# Patient Record
Sex: Female | Born: 1970 | Race: Black or African American | Hispanic: No | Marital: Single | Smoking: Current every day smoker
Health system: Southern US, Community
[De-identification: ages and names within clinical notes are randomized; demographics above are authoritative.]

## PROBLEM LIST (undated history)

## (undated) DIAGNOSIS — E559 Vitamin D deficiency, unspecified: Secondary | ICD-10-CM

## (undated) DIAGNOSIS — T8859XA Other complications of anesthesia, initial encounter: Secondary | ICD-10-CM

## (undated) DIAGNOSIS — I700 Atherosclerosis of aorta: Secondary | ICD-10-CM

## (undated) DIAGNOSIS — T4145XA Adverse effect of unspecified anesthetic, initial encounter: Secondary | ICD-10-CM

## (undated) DIAGNOSIS — K5790 Diverticulosis of intestine, part unspecified, without perforation or abscess without bleeding: Secondary | ICD-10-CM

## (undated) DIAGNOSIS — F419 Anxiety disorder, unspecified: Secondary | ICD-10-CM

## (undated) DIAGNOSIS — M1990 Unspecified osteoarthritis, unspecified site: Secondary | ICD-10-CM

## (undated) DIAGNOSIS — I10 Essential (primary) hypertension: Secondary | ICD-10-CM

## (undated) DIAGNOSIS — E785 Hyperlipidemia, unspecified: Secondary | ICD-10-CM

## (undated) DIAGNOSIS — R569 Unspecified convulsions: Secondary | ICD-10-CM

## (undated) DIAGNOSIS — K449 Diaphragmatic hernia without obstruction or gangrene: Secondary | ICD-10-CM

## (undated) DIAGNOSIS — K760 Fatty (change of) liver, not elsewhere classified: Secondary | ICD-10-CM

## (undated) DIAGNOSIS — K859 Acute pancreatitis without necrosis or infection, unspecified: Secondary | ICD-10-CM

## (undated) DIAGNOSIS — F319 Bipolar disorder, unspecified: Secondary | ICD-10-CM

## (undated) DIAGNOSIS — J45909 Unspecified asthma, uncomplicated: Secondary | ICD-10-CM

## (undated) DIAGNOSIS — Z8719 Personal history of other diseases of the digestive system: Secondary | ICD-10-CM

## (undated) DIAGNOSIS — M6208 Separation of muscle (nontraumatic), other site: Secondary | ICD-10-CM

## (undated) DIAGNOSIS — L732 Hidradenitis suppurativa: Secondary | ICD-10-CM

## (undated) DIAGNOSIS — K219 Gastro-esophageal reflux disease without esophagitis: Secondary | ICD-10-CM

## (undated) HISTORY — PX: CYST REMOVAL TRUNK: SHX6283

## (undated) HISTORY — PX: HERNIA REPAIR: SHX51

## (undated) HISTORY — PX: APPENDECTOMY: SHX54

## (undated) SURGERY — Surgical Case
Anesthesia: *Unknown

---

## 2006-02-08 ENCOUNTER — Ambulatory Visit: Payer: Self-pay | Admitting: General Surgery

## 2007-05-30 ENCOUNTER — Emergency Department: Payer: Self-pay | Admitting: Emergency Medicine

## 2009-03-18 ENCOUNTER — Emergency Department: Payer: Self-pay | Admitting: Emergency Medicine

## 2009-03-25 ENCOUNTER — Emergency Department: Payer: Self-pay | Admitting: Emergency Medicine

## 2009-04-11 ENCOUNTER — Emergency Department: Payer: Self-pay | Admitting: Emergency Medicine

## 2011-01-30 ENCOUNTER — Emergency Department: Payer: Self-pay | Admitting: *Deleted

## 2011-02-03 ENCOUNTER — Inpatient Hospital Stay (INDEPENDENT_AMBULATORY_CARE_PROVIDER_SITE_OTHER)
Admission: RE | Admit: 2011-02-03 | Discharge: 2011-02-03 | Disposition: A | Discharge status: Home / Self Care | Payer: Self-pay | Source: Ambulatory Visit | Attending: Family Medicine | Admitting: Family Medicine

## 2011-02-03 DIAGNOSIS — T7840XA Allergy, unspecified, initial encounter: Secondary | ICD-10-CM

## 2011-06-10 ENCOUNTER — Emergency Department: Payer: Self-pay | Admitting: Internal Medicine

## 2011-06-10 LAB — COMPREHENSIVE METABOLIC PANEL
Albumin: 3.8 g/dL (ref 3.4–5.0)
Alkaline Phosphatase: 71 U/L (ref 50–136)
Anion Gap: 11 (ref 7–16)
Bilirubin,Total: 0.3 mg/dL (ref 0.2–1.0)
Calcium, Total: 9 mg/dL (ref 8.5–10.1)
Chloride: 103 mmol/L (ref 98–107)
Creatinine: 1.03 mg/dL (ref 0.60–1.30)
EGFR (African American): >60
EGFR (Non-African Amer.): >60
Glucose: 91 mg/dL (ref 65–99)
Osmolality: 277 (ref 275–301)
Potassium: 3.7 mmol/L (ref 3.5–5.1)
SGOT(AST): 22 U/L (ref 15–37)
SGPT (ALT): 19 U/L
Total Protein: 7.7 g/dL (ref 6.4–8.2)

## 2011-06-10 LAB — CBC
HCT: 36.3 % (ref 35.0–47.0)
HGB: 12 g/dL (ref 12.0–16.0)
RBC: 4 10*6/uL (ref 3.80–5.20)
RDW: 12.8 % (ref 11.5–14.5)
WBC: 7.5 10*3/uL (ref 3.6–11.0)

## 2011-06-13 ENCOUNTER — Emergency Department: Payer: Self-pay | Admitting: *Deleted

## 2011-06-30 ENCOUNTER — Emergency Department: Payer: Self-pay | Admitting: Emergency Medicine

## 2011-07-12 ENCOUNTER — Emergency Department: Payer: Self-pay | Admitting: Emergency Medicine

## 2011-07-30 ENCOUNTER — Inpatient Hospital Stay: Payer: Self-pay | Admitting: Internal Medicine

## 2011-07-30 LAB — URINALYSIS, COMPLETE
Bilirubin,UR: NEGATIVE
Ketone: NEGATIVE
Leukocyte Esterase: NEGATIVE
Nitrite: NEGATIVE
Ph: 6 (ref 4.5–8.0)
Squamous Epithelial: 13
WBC UR: 4 /HPF (ref 0–5)

## 2011-07-30 LAB — CBC
HGB: 11.5 g/dL — ABNORMAL LOW (ref 12.0–16.0)
MCH: 30.1 pg (ref 26.0–34.0)
MCHC: 33 g/dL (ref 32.0–36.0)
Platelet: 240 10*3/uL (ref 150–440)

## 2011-07-30 LAB — LIPID PANEL
Cholesterol: 242 mg/dL — ABNORMAL HIGH (ref 0–200)
HDL Cholesterol: 27 mg/dL — ABNORMAL LOW (ref 40–60)
VLDL Cholesterol, Calc: 41 mg/dL — ABNORMAL HIGH (ref 5–40)

## 2011-07-30 LAB — COMPREHENSIVE METABOLIC PANEL
Alkaline Phosphatase: 68 U/L (ref 50–136)
BUN: 12 mg/dL (ref 7–18)
Bilirubin,Total: 0.2 mg/dL (ref 0.2–1.0)
Chloride: 100 mmol/L (ref 98–107)
Co2: 24 mmol/L (ref 21–32)
Creatinine: 0.97 mg/dL (ref 0.60–1.30)
EGFR (Non-African Amer.): >60
Glucose: 94 mg/dL (ref 65–99)
SGOT(AST): 19 U/L (ref 15–37)
SGPT (ALT): 17 U/L
Total Protein: 7.8 g/dL (ref 6.4–8.2)

## 2011-07-30 LAB — PREGNANCY, URINE: Pregnancy Test, Urine: NEGATIVE m[IU]/mL

## 2011-07-30 LAB — IRON AND TIBC
Iron Bind.Cap.(Total): 320 ug/dL (ref 250–450)
Iron Saturation: 13 %
Iron: 42 ug/dL — ABNORMAL LOW (ref 50–170)
Unbound Iron-Bind.Cap.: 278 ug/dL

## 2011-07-30 LAB — LIPASE, BLOOD: Lipase: 666 U/L — ABNORMAL HIGH (ref 73–393)

## 2011-07-30 LAB — OCCULT BLOOD X 1 CARD TO LAB, STOOL: Occult Blood, Feces: NEGATIVE

## 2011-07-30 LAB — FERRITIN: Ferritin (ARMC): 35 ng/mL (ref 8–388)

## 2011-07-31 LAB — BASIC METABOLIC PANEL
BUN: 5 mg/dL — ABNORMAL LOW (ref 7–18)
Calcium, Total: 8.5 mg/dL (ref 8.5–10.1)
Co2: 23 mmol/L (ref 21–32)
EGFR (Non-African Amer.): >60
Glucose: 90 mg/dL (ref 65–99)
Potassium: 3.9 mmol/L (ref 3.5–5.1)
Sodium: 140 mmol/L (ref 136–145)

## 2011-07-31 LAB — CBC WITH DIFFERENTIAL/PLATELET
Basophil %: 0.3 %
Eosinophil %: 1.6 %
HGB: 10.9 g/dL — ABNORMAL LOW (ref 12.0–16.0)
Lymphocyte %: 29.8 %
MCH: 30.2 pg (ref 26.0–34.0)
MCHC: 33 g/dL (ref 32.0–36.0)
Monocyte #: 0.5 10*3/uL (ref 0.0–0.7)
Monocyte %: 8 %
Neutrophil %: 60.3 %
Platelet: 225 10*3/uL (ref 150–440)
RBC: 3.61 10*6/uL — ABNORMAL LOW (ref 3.80–5.20)
WBC: 5.6 10*3/uL (ref 3.6–11.0)

## 2013-01-30 ENCOUNTER — Emergency Department: Payer: Self-pay | Admitting: Emergency Medicine

## 2013-01-30 LAB — COMPREHENSIVE METABOLIC PANEL
Albumin: 3.7 g/dL (ref 3.4–5.0)
Anion Gap: 6 — ABNORMAL LOW (ref 7–16)
BUN: 13 mg/dL (ref 7–18)
Calcium, Total: 8.8 mg/dL (ref 8.5–10.1)
Chloride: 100 mmol/L (ref 98–107)
EGFR (Non-African Amer.): >60
Glucose: 91 mg/dL (ref 65–99)
Potassium: 3.9 mmol/L (ref 3.5–5.1)
SGOT(AST): 19 U/L (ref 15–37)
SGPT (ALT): 19 U/L (ref 12–78)
Sodium: 131 mmol/L — ABNORMAL LOW (ref 136–145)
Total Protein: 7.6 g/dL (ref 6.4–8.2)

## 2013-01-30 LAB — URINALYSIS, COMPLETE
Bilirubin,UR: NEGATIVE
Glucose,UR: NEGATIVE mg/dL (ref 0–75)
Ketone: NEGATIVE
Leukocyte Esterase: NEGATIVE
Nitrite: NEGATIVE
Ph: 6 (ref 4.5–8.0)
Protein: 30
RBC,UR: 31 /HPF (ref 0–5)
Specific Gravity: 1.026 (ref 1.003–1.030)
Squamous Epithelial: 26

## 2013-01-30 LAB — PREGNANCY, URINE: Pregnancy Test, Urine: NEGATIVE m[IU]/mL

## 2013-01-30 LAB — CBC
HGB: 12.8 g/dL (ref 12.0–16.0)
MCH: 30.6 pg (ref 26.0–34.0)
MCHC: 34 g/dL (ref 32.0–36.0)
MCV: 90 fL (ref 80–100)
Platelet: 225 10*3/uL (ref 150–440)
RBC: 4.19 10*6/uL (ref 3.80–5.20)
RDW: 12.6 % (ref 11.5–14.5)
WBC: 10.3 10*3/uL (ref 3.6–11.0)

## 2013-04-05 ENCOUNTER — Emergency Department: Payer: Self-pay | Admitting: Emergency Medicine

## 2013-04-05 LAB — COMPREHENSIVE METABOLIC PANEL
Albumin: 3.4 g/dL (ref 3.4–5.0)
Anion Gap: 4 — ABNORMAL LOW (ref 7–16)
Calcium, Total: 8.7 mg/dL (ref 8.5–10.1)
Chloride: 103 mmol/L (ref 98–107)
Co2: 26 mmol/L (ref 21–32)
Creatinine: 0.94 mg/dL (ref 0.60–1.30)
EGFR (African American): >60
Potassium: 3.9 mmol/L (ref 3.5–5.1)
SGOT(AST): 23 U/L (ref 15–37)
Sodium: 133 mmol/L — ABNORMAL LOW (ref 136–145)

## 2013-04-05 LAB — URINALYSIS, COMPLETE
Bacteria: NONE SEEN
Bilirubin,UR: NEGATIVE
Glucose,UR: NEGATIVE mg/dL (ref 0–75)
Ketone: NEGATIVE
Nitrite: NEGATIVE
Ph: 6 (ref 4.5–8.0)
Specific Gravity: 1.021 (ref 1.003–1.030)
WBC UR: 4 /HPF (ref 0–5)

## 2013-04-05 LAB — CBC
HGB: 12.1 g/dL (ref 12.0–16.0)
MCH: 30.3 pg (ref 26.0–34.0)
MCV: 89 fL (ref 80–100)
RDW: 12.5 % (ref 11.5–14.5)

## 2014-07-08 ENCOUNTER — Emergency Department: Payer: Self-pay | Admitting: Emergency Medicine

## 2014-07-15 ENCOUNTER — Emergency Department: Payer: Self-pay | Admitting: Emergency Medicine

## 2014-09-19 NOTE — Discharge Summary (Signed)
PATIENT NAME:  Kristina Schultz, Kristina Schultz MR#:  [id number] DATE OF BIRTH:  1971/03/27  DATE OF ADMISSION:  07/30/2011 DATE OF DISCHARGE:  07/31/2011  ADMISSION DIAGNOSIS: Abdominal pain.  DISCHARGE DIAGNOSES:  1. Abdominal pain due to acute pancreatitis, now resolved.  2. Acute pancreatitis likely due to new therapy with lisinopril. The patient's computerized tomography of the abdomen and ultrasound evaluation was negative. Her triglycerides were in the 200s.  3. Hypertension.  4. Hypercholesterolemia which is poorly controlled. At the time of visit to primary physician, will need to decide whether she needs to be started on treatment.  5. History of headaches.  6. Tobacco abuse.  7. Mild anemia likely due to menstrual loss.  PERTINENT LABORATORY, DIAGNOSTIC AND RADIOLOGICAL DATA:  Lipase 894.  CBC showed WBC 7.4, hemoglobin 11.5, platelet count 240.  Urinalysis: Nitrites negative.  Pregnancy was negative.  Her lipase today was 345.  LFTs were completely normal with alkaline phosphatase of 68, bilirubin total 0.2.  Her cholesterol was 242, triglycerides 205.  Ferritin was 35. Iron was decreased at 42.  Right upper quadrant ultrasound showed normal gallbladder. No other abnormality noted.  CT of the abdomen showed findings consistent with acute pancreatitis. Otherwise, there was no other abnormality.   CONSULTANTS: None.   ALLERGIES: The patient's added allergy now is lisinopril.   HOSPITAL COURSE: Please see History and Physical done by the admitting physician. The patient is a 44 year old African American female with a medical history of hypertension, hypercholesteremia, who was started on lisinopril by her primary care provider for hypertension, presented to the ED with two weeks of abdominal pain which was progressively worse. In the ED, she was noted to have a lipase level that was elevated in the 800s. She had a CT scan of the abdomen which showed findings consistent with acute pancreatitis.  The patient reported no alcohol use. She had no evidence of gallbladder disease. Triglycerides were minimally elevated, but the elevation was not consistent enough to cause pancreatitis. It was felt that her pancreatitis was medication-induced. If she has pancreatitis again off lisinopril, then would consider further evaluation and possible cholecystectomy. The patient was kept n.p.o., given fluids. Her abdominal pain improved by day two. Her lipase started trending down. Her lipase is completely normal now. She is feeling much better and is very anxious to go home. The patient's added allergy now is lisinopril.  Her antihypertensive has been changed to atenolol. She is currently stable for discharge.   DISCHARGE MEDICATIONS:  1. Triamcinolone topically 0.1%,  apply topically t.i.d.  2. Atenolol 25 mg p.o. daily.   NOTE:  The patient was told not to take metoprolol and lisinopril.   DIET: Low sodium as well as low fat diet.   ACTIVITY: As tolerated   FOLLOW UP:  Follow up with [facility] at [facility] in 1 to 2 weeks.    TIME SPENT:   35 minutes.   ____________________________ Lacie ScottsShreyang H. Allena KatzPatel, MD shp:cbb D: 07/31/2011 13:48:43 ET T: 07/31/2011 17:46:46 ET JOB#: [id number]  cc: Lanny Donoso H. Allena KatzPatel, MD, <Dictator> [facility] Charise CarwinSHREYANG H Naarah Borgerding MD ELECTRONICALLY SIGNED 08/03/2011 7:54

## 2014-09-19 NOTE — H&P (Signed)
PATIENT NAME:  Kristina Schultz, Kristina Schultz MR#:  [id number] DATE OF BIRTH:  April 05, 1971  DATE OF ADMISSION:  07/30/2011  REFERRING PHYSICIAN: Dr. Jens SomWiegand   PR[facility]IC[facility]   CHIEF COMPLAINT: "My stomach is hurting".  HISTORY OF PRESENT ILLNESS: The patient is a 44 year old female with past medical history as listed below who presented to the Emergency Department with complaints of abdominal pain over the past two weeks which is worse x1 day today for which she came to the ER for further evaluation. She states initially her abdominal pain started about two weeks ago. Initially her pain was 9 out of 10 in intensity. Pain has been associated with nausea and pain has been worse with p.o. intake. She has not had any vomiting. Pain is mostly in her left upper quadrant and radiates across the back into the left flank region as well. She came to the ER for further evaluation. She received a dose of IV pain medications. Pain has improved from 9 down to 7 out of 10. Otherwise she denies specific complaints at this time. She was noted to have an elevated lipase level consistent with acute pancreatitis. Thereafter, hospitalist services were contacted for further evaluation and for hospital admission.   PAST SURGICAL HISTORY: Cyst removed from her buttocks in 2006.   PAST MEDICAL HISTORY: 1. Hypertension.  2. Hypercholesterolemia, currently diet controlled.  3. History of headaches. 4. Tobacco abuse.   ALLERGIES: Penicillin and amoxicillin cause yeast infection. Sulfa drugs cause blisters and swelling throughout her body.   HOME MEDICATIONS:

## 2015-01-23 ENCOUNTER — Encounter: Payer: Self-pay | Admitting: Emergency Medicine

## 2015-01-23 ENCOUNTER — Emergency Department
Admission: EM | Admit: 2015-01-23 | Discharge: 2015-01-23 | Disposition: A | Discharge status: Home / Self Care | Payer: 59 | Attending: Emergency Medicine | Admitting: Emergency Medicine

## 2015-01-23 DIAGNOSIS — Z720 Tobacco use: Secondary | ICD-10-CM | POA: Insufficient documentation

## 2015-01-23 DIAGNOSIS — L309 Dermatitis, unspecified: Secondary | ICD-10-CM | POA: Diagnosis not present

## 2015-01-23 DIAGNOSIS — Z880 Allergy status to penicillin: Secondary | ICD-10-CM | POA: Diagnosis not present

## 2015-01-23 DIAGNOSIS — I10 Essential (primary) hypertension: Secondary | ICD-10-CM | POA: Insufficient documentation

## 2015-01-23 DIAGNOSIS — R21 Rash and other nonspecific skin eruption: Secondary | ICD-10-CM | POA: Diagnosis present

## 2015-01-23 HISTORY — DX: Unspecified asthma, uncomplicated: J45.909

## 2015-01-23 HISTORY — DX: Essential (primary) hypertension: I10

## 2015-01-23 HISTORY — DX: Bipolar disorder, unspecified: F31.9

## 2015-01-23 MED ORDER — RANITIDINE HCL 150 MG PO TABS: 150.0000 mg | ORAL_TABLET | Freq: Two times a day (BID) | ORAL | Status: DC

## 2015-01-23 MED ORDER — DIPHENHYDRAMINE HCL 25 MG PO CAPS
25.0000 mg | ORAL_CAPSULE | ORAL | Status: DC | PRN
Start: 2015-01-23 — End: 2016-02-19

## 2015-01-23 MED ORDER — TRIAMCINOLONE ACETONIDE 0.5 % EX OINT: 1.0000 "application " | TOPICAL_OINTMENT | Freq: Two times a day (BID) | CUTANEOUS | Status: DC

## 2015-01-23 NOTE — ED Provider Notes (Signed)
[facility] Emergency Department Provider Note ____________________________________________  Time seen: Approximately 2:06 PM  I have reviewed the triage vital signs and the nursing notes.   HISTORY  Chief Complaint Allergic Reaction   HPI Kristina Schultz is a 44 y.o. female who presents to the emergency department for evaluation of a facial rash that started3 days ago. Has not spread anywhere else. Mildly pruritic. Burns and feels a little tender.   Past Medical History  Diagnosis Date  . Hypertension   . Asthma   . Bipolar 1 disorder     There are no active problems to display for this patient.   History reviewed. No pertinent past surgical history.  Current Outpatient Rx  Name  Route  Sig  Dispense  Refill  . diphenhydrAMINE (BENADRYL) 25 mg capsule   Oral   Take 1 capsule (25 mg total) by mouth every 4 (four) hours as needed.   30 capsule   0   . ranitidine (ZANTAC) 150 MG tablet   Oral   Take 1 tablet (150 mg total) by mouth 2 (two) times daily.   30 tablet   0   . triamcinolone ointment (KENALOG) 0.5 %   Topical   Apply 1 application topically 2 (two) times daily.   30 g   0     Allergies Penicillins and Sulfa antibiotics  No family history on file.  Social History Social History  Substance Use Topics  . Smoking status: Current Every Day Smoker  . Smokeless tobacco: None  . Alcohol Use: No    Review of Systems  Constitutional: No fever/chills Eyes: No visual changes. ENT: No congestion or rhinorrhea Cardiovascular: Denies chest pain. Respiratory: Denies shortness of breath. Gastrointestinal: No abdominal pain.  No nausea, no vomiting.  No diarrhea.  No constipation. Genitourinary: Negative for dysuria. Musculoskeletal: Negative for back pain. Skin: Rash to face Neurological: Negative for headaches, focal weakness or numbness.  10-point ROS otherwise  negative.  ____________________________________________   PHYSICAL EXAM:  VITAL SIGNS: ED Triage Vitals  Enc Vitals Group     BP 01/23/15 1325 139/84 mmHg     Pulse Rate 01/23/15 1325 78     Resp 01/23/15 1325 20     Temp 01/23/15 1325 98.3 F (36.8 C)     Temp Source 01/23/15 1325 Oral     SpO2 01/23/15 1325 98 %     Weight 01/23/15 1325 270 lb (122.471 kg)     Height 01/23/15 1325  (1.803 m)     Head Cir --      Peak Flow --      Pain Score --      Pain Loc --      Pain Edu? --      Excl. in GC? --     Constitutional: Alert and oriented. Well appearing and in no acute distress. Eyes: Conjunctivae are normal. PERRL. EOMI. Head: Atraumatic. Nose: No congestion/rhinnorhea. Mouth/Throat: Mucous membranes are moist.  Oropharynx non-erythematous. No oral lesions. Neck: No stridor. Cardiovascular: Normal rate, regular rhythm.  Good peripheral circulation. Respiratory: Normal respiratory effort.  No retractions. Lungs CTAB. Gastrointestinal: Soft and nontender. No distention. No abdominal bruits.  Musculoskeletal: No lower extremity tenderness nor edema.  No joint effusions. Neurologic:  Normal speech and language. No gross focal neurologic deficits are appreciated. Speech is normal. No gait instability. Skin:   Urticarial appearing rash noted to face. No lesions or vesicles.; Negative for petechiae.  Psychiatric: Mood and affect are normal.  Speech and behavior are normal.  ____________________________________________   LABS (all labs ordered are listed, but only abnormal results are displayed)  Labs Reviewed - No data to display ____________________________________________  EKG  ____________________________________________  RADIOLOGY  Not indicated ____________________________________________   PROCEDURES  Procedure(s) performed: None ____________________________________________   INITIAL IMPRESSION / ASSESSMENT AND PLAN / ED COURSE  Pertinent labs  & imaging results that were available during my care of the patient were reviewed by me and considered in my medical decision making (see chart for details).  Patient was advised to follow up with primary care or dermatology for symptoms that do not resolve with medication. She was advised to return to the ER for symptoms that change or worsen if unable to schedule an appointment. ____________________________________________   FINAL CLINICAL IMPRESSION(S) / ED DIAGNOSES  Final diagnoses:  Dermatitis       Chinita Pester, FNP 01/23/15 1510  Jennye Moccasin, MD 01/23/15 1544

## 2015-01-23 NOTE — ED Notes (Signed)
Rt sided facial itching, mild redness noted, no airway compromise.

## 2015-01-23 NOTE — ED Notes (Signed)
Rash to face   Unsure of allergic reaction

## 2015-02-01 ENCOUNTER — Emergency Department
Admission: EM | Admit: 2015-02-01 | Discharge: 2015-02-01 | Disposition: A | Discharge status: Home / Self Care | Payer: 59 | Attending: Emergency Medicine | Admitting: Emergency Medicine

## 2015-02-01 ENCOUNTER — Encounter: Payer: Self-pay | Admitting: Emergency Medicine

## 2015-02-01 ENCOUNTER — Emergency Department: Payer: 59

## 2015-02-01 DIAGNOSIS — K859 Acute pancreatitis, unspecified: Secondary | ICD-10-CM

## 2015-02-01 DIAGNOSIS — Z7952 Long term (current) use of systemic steroids: Secondary | ICD-10-CM | POA: Diagnosis not present

## 2015-02-01 DIAGNOSIS — Z79899 Other long term (current) drug therapy: Secondary | ICD-10-CM | POA: Insufficient documentation

## 2015-02-01 DIAGNOSIS — Z720 Tobacco use: Secondary | ICD-10-CM | POA: Insufficient documentation

## 2015-02-01 DIAGNOSIS — R1010 Upper abdominal pain, unspecified: Secondary | ICD-10-CM | POA: Diagnosis present

## 2015-02-01 DIAGNOSIS — Z880 Allergy status to penicillin: Secondary | ICD-10-CM | POA: Diagnosis not present

## 2015-02-01 DIAGNOSIS — I10 Essential (primary) hypertension: Secondary | ICD-10-CM | POA: Diagnosis not present

## 2015-02-01 DIAGNOSIS — R1013 Epigastric pain: Secondary | ICD-10-CM

## 2015-02-01 LAB — CBC
HCT: 37.4 % (ref 35.0–47.0)
Hemoglobin: 12.3 g/dL (ref 12.0–16.0)
MCH: 29.7 pg (ref 26.0–34.0)
MCHC: 33 g/dL (ref 32.0–36.0)
MCV: 90 fL (ref 80.0–100.0)
PLATELETS: 255 10*3/uL (ref 150–440)
RBC: 4.16 MIL/uL (ref 3.80–5.20)
RDW: 12.9 % (ref 11.5–14.5)
WBC: 8.8 10*3/uL (ref 3.6–11.0)

## 2015-02-01 LAB — COMPREHENSIVE METABOLIC PANEL
ALT: 16 U/L (ref 14–54)
AST: 24 U/L (ref 15–41)
Albumin: 4.2 g/dL (ref 3.5–5.0)
Alkaline Phosphatase: 84 U/L (ref 38–126)
Anion gap: 8 (ref 5–15)
BILIRUBIN TOTAL: 0.5 mg/dL (ref 0.3–1.2)
BUN: 11 mg/dL (ref 6–20)
CO2: 22 mmol/L (ref 22–32)
CREATININE: 0.93 mg/dL (ref 0.44–1.00)
Calcium: 8.7 mg/dL — ABNORMAL LOW (ref 8.9–10.3)
Chloride: 94 mmol/L — ABNORMAL LOW (ref 101–111)
Glucose, Bld: 80 mg/dL (ref 65–99)
POTASSIUM: 4.2 mmol/L (ref 3.5–5.1)
Sodium: 124 mmol/L — ABNORMAL LOW (ref 135–145)
TOTAL PROTEIN: 8 g/dL (ref 6.5–8.1)

## 2015-02-01 LAB — URINALYSIS COMPLETE WITH MICROSCOPIC (ARMC ONLY)
BILIRUBIN URINE: NEGATIVE
Bacteria, UA: NONE SEEN
Glucose, UA: NEGATIVE mg/dL
KETONES UR: NEGATIVE mg/dL
Leukocytes, UA: NEGATIVE
NITRITE: NEGATIVE
PH: 5 (ref 5.0–8.0)
Protein, ur: NEGATIVE mg/dL
SPECIFIC GRAVITY, URINE: 1.017 (ref 1.005–1.030)

## 2015-02-01 LAB — LIPASE, BLOOD: Lipase: 83 U/L — ABNORMAL HIGH (ref 22–51)

## 2015-02-01 MED ORDER — SODIUM CHLORIDE 0.9 % IV BOLUS (SEPSIS)
500.0000 mL | Freq: Once | INTRAVENOUS | Status: AC
  Administered 2015-02-01: 500 mL via INTRAVENOUS

## 2015-02-01 MED ORDER — ONDANSETRON 4 MG PO TBDP
4.0000 mg | ORAL_TABLET | Freq: Once | ORAL | Status: AC
  Administered 2015-02-01: 4 mg via ORAL
  Filled 2015-02-01: qty 1

## 2015-02-01 MED ORDER — ONDANSETRON 4 MG PO TBDP: 4.0000 mg | ORAL_TABLET | Freq: Four times a day (QID) | ORAL | Status: DC | PRN

## 2015-02-01 MED ORDER — HYDROCODONE-ACETAMINOPHEN 5-325 MG PO TABS
2.0000 | ORAL_TABLET | Freq: Once | ORAL | Status: AC
  Administered 2015-02-01: 2 via ORAL
  Filled 2015-02-01: qty 2

## 2015-02-01 MED ORDER — RANITIDINE HCL 150 MG/10ML PO SYRP
150.0000 mg | ORAL_SOLUTION | Freq: Once | ORAL | Status: AC
  Administered 2015-02-01: 150 mg via ORAL
  Filled 2015-02-01: qty 10

## 2015-02-01 MED ORDER — HYDROCODONE-ACETAMINOPHEN 5-325 MG PO TABS: 1.0000 | ORAL_TABLET | Freq: Four times a day (QID) | ORAL | Status: DC | PRN

## 2015-02-01 NOTE — ED Notes (Signed)
Pt sitting up on side of bed in exam room watching TV; pt denies any c/o at present, stating that she is ready for d/c; Dr Fanny Bien  Notified and in to speak with pt

## 2015-02-01 NOTE — ED Notes (Signed)
Patient to ED with c/o upper and mid abdominal pain and bloating that started over a week ago.

## 2015-02-01 NOTE — ED Provider Notes (Addendum)
[facility] Emergency Department Provider Note REMINDER - THIS NOTE IS NOT A FINAL MEDICAL RECORD UNTIL IT IS SIGNED. UNTIL THEN, THE CONTENT BELOW MAY REFLECT INFORMATION FROM A DOCUMENTATION TEMPLATE, NOT THE ACTUAL PATIENT VISIT. ____________________________________________  Time seen: Approximately 7:53 PM  I have reviewed the triage vital signs and the nursing notes.   HISTORY  Chief Complaint Abdominal Pain and Bloated    HPI Kristina Schultz is a 44 y.o. female  history of previous pancreatitisand hypertension. She presents today stating she is had about a week of upper abdominal pain, fairly sharp and located in the mid upper abdomen. She reports that she feels slightly bloated as well. She continues normal bowel movements. No fevers or chills. No chest pain or trouble breathing. She denies being pregnant. Normal menstrual period. No lower abdominal pain.  She reports this feels similar, but not as intense as previous pancreatitis. She reports able to eat, but eating slightly less than usual and not staying as hydrated.    Past Medical History  Diagnosis Date  . Hypertension   . Asthma   . Bipolar 1 disorder     There are no active problems to display for this patient.   History reviewed. No pertinent past surgical history.  Current Outpatient Rx  Name  Route  Sig  Dispense  Refill  . diphenhydrAMINE (BENADRYL) 25 mg capsule   Oral   Take 1 capsule (25 mg total) by mouth every 4 (four) hours as needed.   30 capsule   0   . HYDROcodone-acetaminophen (NORCO/VICODIN) 5-325 MG per tablet   Oral   Take 1 tablet by mouth every 6 (six) hours as needed for moderate pain.   15 tablet   0   . ondansetron (ZOFRAN ODT) 4 MG disintegrating tablet   Oral   Take 1 tablet (4 mg total) by mouth every 6 (six) hours as needed for nausea or vomiting.   20 tablet   0   . ranitidine (ZANTAC) 150 MG tablet   Oral   Take 1 tablet (150 mg total) by mouth 2  (two) times daily.   30 tablet   0   . triamcinolone ointment (KENALOG) 0.5 %   Topical   Apply 1 application topically 2 (two) times daily.   30 g   0     Allergies Penicillins and Sulfa antibiotics  History reviewed. No pertinent family history.  Social History Social History  Substance Use Topics  . Smoking status: Current Every Day Smoker  . Smokeless tobacco: None  . Alcohol Use: No    Review of Systems Constitutional: No fever/chills Eyes: No visual changes. ENT: No sore throat. Cardiovascular: Denies chest pain. Respiratory: Denies shortness of breath. Gastrointestinal: No nausea, no vomiting.  No diarrhea.  No constipation. Normal formed bowel movement each morning Genitourinary: Negative for dysuria. Musculoskeletal: Negative for back pain. Skin: Negative for rash. Neurological: Negative for headaches, focal weakness or numbness.  10-point ROS otherwise negative.  ____________________________________________   PHYSICAL EXAM:  VITAL SIGNS: ED Triage Vitals  Enc Vitals Group     BP 02/01/15 1525 123/109 mmHg     Pulse Rate 02/01/15 1525 70     Resp 02/01/15 1525 20     Temp 02/01/15 1525 97.6 F (36.4 C)     Temp Source 02/01/15 1525 Oral     SpO2 02/01/15 1525 100 %     Weight 02/01/15 1525 265 lb (120.203 kg)     Height  02/01/15 1525  (1.803 m)     Head Cir --      Peak Flow --      Pain Score 02/01/15 1526 8     Pain Loc --      Pain Edu? --      Excl. in GC? --    Constitutional: Alert and oriented. Well appearing and in no acute distress. Eyes: Conjunctivae are normal. PERRL. EOMI. Head: Atraumatic. Nose: No congestion/rhinnorhea. Mouth/Throat: Mucous membranes are moist.  Oropharynx non-erythematous. Neck: No stridor.   Cardiovascular: Normal rate, regular rhythm. Grossly normal heart sounds.  Good peripheral circulation. Respiratory: Normal respiratory effort.  No retractions. Lungs CTAB. Gastrointestinal: Soft and nontender  except for some mild tenderness in the epigastrium without rebound or guarding. No distention. No abdominal bruits. No CVA tenderness. Musculoskeletal: No lower extremity tenderness nor edema.  No joint effusions. Neurologic:  Normal speech and language. No gross focal neurologic deficits are appreciated. No gait instability. Skin:  Skin is warm, dry and intact. No rash noted. Psychiatric: Mood and affect are normal. Speech and behavior are normal.  Patient reports that she is a lesbian, and is not sexually active with males. No chance of pregnancy. ____________________________________________   LABS (all labs ordered are listed, but only abnormal results are displayed)  Labs Reviewed  LIPASE, BLOOD - Abnormal; Notable for the following:    Lipase 83 (*)    All other components within normal limits  COMPREHENSIVE METABOLIC PANEL - Abnormal; Notable for the following:    Sodium 124 (*)    Chloride 94 (*)    Calcium 8.7 (*)    All other components within normal limits  URINALYSIS COMPLETEWITH MICROSCOPIC ([facility] ONLY) - Abnormal; Notable for the following:    Color, Urine YELLOW (*)    APPearance HAZY (*)    Hgb urine dipstick 3+ (*)    Squamous Epithelial / LPF 6-30 (*)    All other components within normal limits  CBC  BASIC METABOLIC PANEL   ____________________________________________  EKG  Reviewed and interpreted by me Compared with EKG from 2014 November no changes Normal sinus rhythm Ventricular rate 65 QRS 72 QTc 4:30 Normal sinus rhythm, nonspecific T-wave at  V2 and V3 which is unchanged from previous ____________________________________________  RADIOLOGY  US Abdomen Limited RUQ (Final result) Result time: 02/01/15 18:28:30   Final result by Rad Results In Interface (02/01/15 18:28:30)   Narrative:   CLINICAL DATA: Acute epigastric abdominal pain.  EXAM: US ABDOMEN LIMITED - RIGHT UPPER QUADRANT  COMPARISON: CT scan of January 30, 2013.  FINDINGS: Gallbladder:  No gallstones or wall thickening visualized. No sonographic Murphy sign noted.  Common bile duct:  Diameter: 4 mm which is within normal limits.  Liver:  No focal lesion identified. Within normal limits in parenchymal echogenicity.  IMPRESSION: No abnormality seen in the right upper quadrant.   Electronically Signed By: Lupita Raider, M.D. On: 02/01/2015 18:28          DG Abd 2 Views (Final result) Result time: 02/01/15 18:18:35   Final result by Rad Results In Interface (02/01/15 18:18:35)   Narrative:   CLINICAL DATA: Central abdominal pain for 1 week.  EXAM: ABDOMEN - 2 VIEW  COMPARISON: None.  FINDINGS: The bowel gas pattern is normal. There is no evidence of free air. Moderate amount of stool throughout the colon. No radio-opaque calculi or other significant radiographic abnormality is seen.  IMPRESSION: Moderate amounts of stool throughout the colon, likely due  to constipation.  Nonobstructive bowel gas pattern.     ____________________________________________   PROCEDURES  Procedure(s) performed: None  Critical Care performed: No  ____________________________________________   INITIAL IMPRESSION / ASSESSMENT AND PLAN / ED COURSE  Pertinent labs & imaging results that were available during my care of the patient were reviewed by me and considered in my medical decision making (see chart for details).  Patient resents with mild to moderate upper abdominal pain for about one week. She is a previous history of pancreatitis, though she denies use of alcohol and reports her previous. Chest related to lisinopril which she takes. She is overall well-appearing, labs to indicate slightly elevated lipase. Ultrasound right upper quadrant appears normal and there is normal-appearing ducts. Nothing to indicate acute biliary obstruction or pancreatic obstruction. Her LFTs are reassuring. No cardiopulmonary  symptoms. EKG no changes.  We'll control her pain, observe her. Her sodium is slightly low, we will hydrate and repeat his sodium to assure that is improving. If her sodium is improving, and she continues to feel well with pain control anticipate discharge with careful follow-up and return precautions.  ----------------------------------------- 8:29 PM on 02/01/2015 -----------------------------------------  The patient reports she feels much improved. She is currently awake alert no distress. She is tolerating by mouth without difficulty. I did discuss her labs, and her sodium is slightly low but she has no symptomatology of hyponatremia. No confusion, no generalized weakness, no muscle fatigue, no headache. I offered the patient repeat sodium testing, but she is ready to be discharged. She would like to go. She'll follow-up with her primary care doctor, I will give her prescription for hydrocodone for mild pancreatitis this point. Her doctor sent H pylori test test today, and she'll follow-up with her doctor [facility].      Careful return precautions, no driving, and safety precautions while using hydrocodone discussed with the patient is agreeable.  I will prescribe the patient a narcotic pain medicine due to their condition which I anticipate will cause at least moderate pain short term. I discussed with the patient safe use of narcotic pain medicines, and that they are not to drive, work in dangerous areas, or ever take more than prescribed (no more than 1 pill every 6 hours). We discussed the risks of this type of medicine. Patient is very agreeable to only use as prescribed and to never use more than prescribed.  ____________________________________________   FINAL CLINICAL IMPRESSION(S) / ED DIAGNOSES  Final diagnoses:  Epigastric pain  Epigastric abdominal pain  Acute pancreatitis, unspecified pancreatitis type      Sharyn Creamer, MD 02/01/15 1610  Sharyn Creamer, MD 02/01/15  2034

## 2015-02-01 NOTE — Discharge Instructions (Signed)

## 2015-02-01 NOTE — ED Notes (Signed)
Pt has upper abd pain.  Reports pancreatitis hx.  Saw pmd today and taking maalox and zantac without relief.  No etoh use.  No v/d today.  Pt alert.

## 2015-03-06 ENCOUNTER — Emergency Department
Admission: EM | Admit: 2015-03-06 | Discharge: 2015-03-06 | Disposition: A | Discharge status: Home / Self Care | Payer: 59 | Attending: Emergency Medicine | Admitting: Emergency Medicine

## 2015-03-06 DIAGNOSIS — R05 Cough: Secondary | ICD-10-CM | POA: Diagnosis present

## 2015-03-06 DIAGNOSIS — Z720 Tobacco use: Secondary | ICD-10-CM | POA: Insufficient documentation

## 2015-03-06 DIAGNOSIS — J45909 Unspecified asthma, uncomplicated: Secondary | ICD-10-CM | POA: Insufficient documentation

## 2015-03-06 DIAGNOSIS — J40 Bronchitis, not specified as acute or chronic: Secondary | ICD-10-CM

## 2015-03-06 DIAGNOSIS — Z880 Allergy status to penicillin: Secondary | ICD-10-CM | POA: Diagnosis not present

## 2015-03-06 DIAGNOSIS — I10 Essential (primary) hypertension: Secondary | ICD-10-CM | POA: Insufficient documentation

## 2015-03-06 DIAGNOSIS — Z79899 Other long term (current) drug therapy: Secondary | ICD-10-CM | POA: Insufficient documentation

## 2015-03-06 MED ORDER — IPRATROPIUM-ALBUTEROL 0.5-2.5 (3) MG/3ML IN SOLN
RESPIRATORY_TRACT | Status: AC
  Administered 2015-03-06: 3 mL via RESPIRATORY_TRACT
  Filled 2015-03-06: qty 3

## 2015-03-06 MED ORDER — PREDNISONE 50 MG PO TABS: 50.0000 mg | ORAL_TABLET | Freq: Every day | ORAL | Status: DC

## 2015-03-06 MED ORDER — IPRATROPIUM-ALBUTEROL 0.5-2.5 (3) MG/3ML IN SOLN
3.0000 mL | Freq: Once | RESPIRATORY_TRACT | Status: AC
  Administered 2015-03-06: 3 mL via RESPIRATORY_TRACT

## 2015-03-06 MED ORDER — AZITHROMYCIN 250 MG PO TABS: ORAL_TABLET | ORAL | Status: AC

## 2015-03-06 MED ORDER — ALBUTEROL SULFATE HFA 108 (90 BASE) MCG/ACT IN AERS: 2.0000 | INHALATION_SPRAY | Freq: Four times a day (QID) | RESPIRATORY_TRACT | Status: AC | PRN

## 2015-03-06 NOTE — ED Notes (Signed)
MD Kinner at bedside  

## 2015-03-06 NOTE — ED Notes (Signed)
Cough and congestion with hx of asthma x 3 weeks. NAD noted at this time.

## 2015-03-06 NOTE — Discharge Instructions (Signed)
Upper Respiratory Infection, Adult Most upper respiratory infections (URIs) are a viral infection of the air passages leading to the lungs. A URI affects the nose, throat, and upper air passages. The most common type of URI is nasopharyngitis and is typically referred to as "the common cold." URIs run their course and usually go away on their own. Most of the time, a URI does not require medical attention, but sometimes a bacterial infection in the upper airways can follow a viral infection. This is called a secondary infection. Sinus and middle ear infections are common types of secondary upper respiratory infections. Bacterial pneumonia can also complicate a URI. A URI can worsen asthma and chronic obstructive pulmonary disease (COPD). Sometimes, these complications can require emergency medical care and may be life threatening.  CAUSES Almost all URIs are caused by viruses. A virus is a type of germ and can spread from one person to another.  RISKS FACTORS You may be at risk for a URI if:   You smoke.   You have chronic heart or lung disease.  You have a weakened defense (immune) system.   You are very young or very old.   You have nasal allergies or asthma.  You work in crowded or poorly ventilated areas.  You work in health care facilities or schools. SIGNS AND SYMPTOMS  Symptoms typically develop 2-3 days after you come in contact with a cold virus. Most viral URIs last 7-10 days. However, viral URIs from the influenza virus (flu virus) can last 14-18 days and are typically more severe. Symptoms may include:   Runny or stuffy (congested) nose.   Sneezing.   Cough.   Sore throat.   Headache.   Fatigue.   Fever.   Loss of appetite.   Pain in your forehead, behind your eyes, and over your cheekbones (sinus pain).  Muscle aches.  DIAGNOSIS  Your health care provider may diagnose a URI by:  Physical exam.  Tests to check that your symptoms are not due to  another condition such as:  Strep throat.  Sinusitis.  Pneumonia.  Asthma. TREATMENT  A URI goes away on its own with time. It cannot be cured with medicines, but medicines may be prescribed or recommended to relieve symptoms. Medicines may help:  Reduce your fever.  Reduce your cough.  Relieve nasal congestion. HOME CARE INSTRUCTIONS   Take medicines only as directed by your health care provider.   Gargle warm saltwater or take cough drops to comfort your throat as directed by your health care provider.  Use a warm mist humidifier or inhale steam from a shower to increase air moisture. This may make it easier to breathe.  Drink enough fluid to keep your urine clear or pale yellow.   Eat soups and other clear broths and maintain good nutrition.   Rest as needed.   Return to work when your temperature has returned to normal or as your health care provider advises. You may need to stay home longer to avoid infecting others. You can also use a face mask and careful hand washing to prevent spread of the virus.  Increase the usage of your inhaler if you have asthma.   Do not use any tobacco products, including cigarettes, chewing tobacco, or electronic cigarettes. If you need help quitting, ask your health care provider. PREVENTION  The best way to protect yourself from getting a cold is to practice good hygiene.   Avoid oral or hand contact with people with cold   symptoms.   Wash your hands often if contact occurs.  There is no clear evidence that vitamin C, vitamin E, echinacea, or exercise reduces the chance of developing a cold. However, it is always recommended to get plenty of rest, exercise, and practice good nutrition.  SEEK MEDICAL CARE IF:   You are getting worse rather than better.   Your symptoms are not controlled by medicine.   You have chills.  You have worsening shortness of breath.  You have brown or red mucus.  You have yellow or brown nasal  discharge.  You have pain in your face, especially when you bend forward.  You have a fever.  You have swollen neck glands.  You have pain while swallowing.  You have white areas in the back of your throat. SEEK IMMEDIATE MEDICAL CARE IF:   You have severe or persistent:  Headache.  Ear pain.  Sinus pain.  Chest pain.  You have chronic lung disease and any of the following:  Wheezing.  Prolonged cough.  Coughing up blood.  A change in your usual mucus.  You have a stiff neck.  You have changes in your:  Vision.  Hearing.  Thinking.  Mood. MAKE SURE YOU:   Understand these instructions.  Will watch your condition.  Will get help right away if you are not doing well or get worse.   This information is not intended to replace advice given to you by your health care provider. Make sure you discuss any questions you have with your health care provider.   Document Released: 11/07/2000 Document Revised: 09/28/2014 Document Reviewed: 08/19/2013 Elsevier Interactive Patient Education 2016 Elsevier Inc.  

## 2015-03-06 NOTE — ED Provider Notes (Signed)
[facility] Emergency Department Provider Note  ____________________________________________  Time seen: On arrival  I have reviewed the triage vital signs and the nursing notes.   HISTORY  Chief Complaint Cough    HPI Kristina Schultz is a 44 y.o. female who presents with complaints of cough and congestion for approximately 3 weeks. She also notes a history of asthma. She does smoke. She denies fevers chills. No shortness of breath. No recent travel or leg swelling or pain. No chest pain or nausea vomiting. She reports she cannot seem to shake the cough.     Past Medical History  Diagnosis Date  . Hypertension   . Asthma   . Bipolar 1 disorder     There are no active problems to display for this patient.   No past surgical history on file.  Current Outpatient Rx  Name  Route  Sig  Dispense  Refill  . albuterol (PROVENTIL HFA;VENTOLIN HFA) 108 (90 BASE) MCG/ACT inhaler   Inhalation   Inhale 2 puffs into the lungs every 6 (six) hours as needed for wheezing or shortness of breath.   1 Inhaler   2   . azithromycin (ZITHROMAX Z-PAK) 250 MG tablet      Take 2 tablets (500 mg) on  Day 1,  followed by 1 tablet (250 mg) once daily on Days 2 through 5.   6 each   0   . diphenhydrAMINE (BENADRYL) 25 mg capsule   Oral   Take 1 capsule (25 mg total) by mouth every 4 (four) hours as needed.   30 capsule   0   . HYDROcodone-acetaminophen (NORCO/VICODIN) 5-325 MG per tablet   Oral   Take 1 tablet by mouth every 6 (six) hours as needed for moderate pain.   15 tablet   0   . ondansetron (ZOFRAN ODT) 4 MG disintegrating tablet   Oral   Take 1 tablet (4 mg total) by mouth every 6 (six) hours as needed for nausea or vomiting.   20 tablet   0   . predniSONE (DELTASONE) 50 MG tablet   Oral   Take 1 tablet (50 mg total) by mouth daily with breakfast.   3 tablet   0   . ranitidine (ZANTAC) 150 MG tablet   Oral   Take 1 tablet (150 mg total) by  mouth 2 (two) times daily.   30 tablet   0   . triamcinolone ointment (KENALOG) 0.5 %   Topical   Apply 1 application topically 2 (two) times daily.   30 g   0     Allergies Penicillins and Sulfa antibiotics  No family history on file.  Social History Social History  Substance Use Topics  . Smoking status: Current Every Day Smoker  . Smokeless tobacco: Not on file  . Alcohol Use: No    Review of Systems  Constitutional: Negative for fever. Eyes: Negative for visual changes. ENT: Negative for sore throat Cardiovascular: Negative for chest pain. Respiratory: Negative for shortness of breath. Positive for cough Gastrointestinal: Negative for abdominal pain, vomiting and diarrhea. Genitourinary: Negative for dysuria. Musculoskeletal: Negative for back pain. Skin: Negative for rash. Neurological: Negative for headaches      ____________________________________________   PHYSICAL EXAM:  VITAL SIGNS: ED Triage Vitals  Enc Vitals Group     BP 03/06/15 1546 133/81 mmHg     Pulse Rate 03/06/15 1546 78     Resp 03/06/15 1546 18     Temp 03/06/15  1546 97.5 F (36.4 C)     Temp Source 03/06/15 1546 Oral     SpO2 03/06/15 1546 97 %     Weight 03/06/15 1546 265 lb (120.203 kg)     Height 03/06/15 1546 6' (1.829 m)     Head Cir --      Peak Flow --      Pain Score --      Pain Loc --      Pain Edu? --      Excl. in GC? --      Constitutional: Alert and oriented. Well appearing and in no distress. Eyes: Conjunctivae are normal.  ENT   Head: Normocephalic and atraumatic.   Mouth/Throat: Mucous membranes are moist. Cardiovascular: Normal rate, regular rhythm. Normal and symmetric distal pulses are present in all extremities. No murmurs, rubs, or gallops. Respiratory: Normal respiratory effort without tachypnea nor retractions. Scattered mild wheezes Gastrointestinal: Soft and non-tender in all quadrants. No distention. There is no CVA  tenderness. Genitourinary: deferred Musculoskeletal: Nontender with normal range of motion in all extremities. No lower extremity tenderness nor edema. Neurologic:  Normal speech and language. No gross focal neurologic deficits are appreciated. Skin:  Skin is warm, dry and intact. No rash noted. Psychiatric: Mood and affect are normal. Patient exhibits appropriate insight and judgment.  ____________________________________________    LABS (pertinent positives/negatives)  Labs Reviewed - No data to display  ____________________________________________   EKG  None  ____________________________________________    RADIOLOGY I have personally reviewed any xrays that were ordered on this patient: None  ____________________________________________   PROCEDURES  Procedure(s) performed: none  Critical Care performed: none  ____________________________________________   INITIAL IMPRESSION / ASSESSMENT AND PLAN / ED COURSE  Pertinent labs & imaging results that were available during my care of the patient were reviewed by me and considered in my medical decision making (see chart for details).  Patient well-appearing and in no distress. Her vital signs are unremarkable. She does have some scattered wheezes for which we will give a DuoNeb. I suspect her smoking and asthma is exacerbating her upper respiratory infection. No evidence of a bacterial infection. We will treat her with prednisone, albuterol. I have asked her to follow up with her PCP if she does not feel better this week. If symptoms are worsening she will come back to the emergency department  ____________________________________________   FINAL CLINICAL IMPRESSION(S) / ED DIAGNOSES  Final diagnoses:  Bronchitis     Jene Every, MD 03/06/15 [phone]

## 2015-05-17 ENCOUNTER — Encounter: Payer: Self-pay | Admitting: *Deleted

## 2015-05-17 ENCOUNTER — Emergency Department: Payer: 59

## 2015-05-17 ENCOUNTER — Emergency Department
Admission: EM | Admit: 2015-05-17 | Discharge: 2015-05-17 | Disposition: A | Discharge status: Home / Self Care | Payer: 59 | Attending: Emergency Medicine | Admitting: Emergency Medicine

## 2015-05-17 DIAGNOSIS — S0993XA Unspecified injury of face, initial encounter: Secondary | ICD-10-CM | POA: Insufficient documentation

## 2015-05-17 DIAGNOSIS — F17200 Nicotine dependence, unspecified, uncomplicated: Secondary | ICD-10-CM | POA: Diagnosis not present

## 2015-05-17 DIAGNOSIS — Y998 Other external cause status: Secondary | ICD-10-CM | POA: Insufficient documentation

## 2015-05-17 DIAGNOSIS — Z79899 Other long term (current) drug therapy: Secondary | ICD-10-CM | POA: Diagnosis not present

## 2015-05-17 DIAGNOSIS — Z7952 Long term (current) use of systemic steroids: Secondary | ICD-10-CM | POA: Diagnosis not present

## 2015-05-17 DIAGNOSIS — W1839XA Other fall on same level, initial encounter: Secondary | ICD-10-CM | POA: Insufficient documentation

## 2015-05-17 DIAGNOSIS — Z3202 Encounter for pregnancy test, result negative: Secondary | ICD-10-CM | POA: Insufficient documentation

## 2015-05-17 DIAGNOSIS — Y9389 Activity, other specified: Secondary | ICD-10-CM | POA: Diagnosis not present

## 2015-05-17 DIAGNOSIS — Z880 Allergy status to penicillin: Secondary | ICD-10-CM | POA: Insufficient documentation

## 2015-05-17 DIAGNOSIS — S40012A Contusion of left shoulder, initial encounter: Secondary | ICD-10-CM | POA: Diagnosis not present

## 2015-05-17 DIAGNOSIS — I10 Essential (primary) hypertension: Secondary | ICD-10-CM | POA: Diagnosis not present

## 2015-05-17 DIAGNOSIS — R569 Unspecified convulsions: Secondary | ICD-10-CM | POA: Insufficient documentation

## 2015-05-17 DIAGNOSIS — S0990XA Unspecified injury of head, initial encounter: Secondary | ICD-10-CM | POA: Diagnosis not present

## 2015-05-17 DIAGNOSIS — Y9289 Other specified places as the place of occurrence of the external cause: Secondary | ICD-10-CM | POA: Diagnosis not present

## 2015-05-17 DIAGNOSIS — S80212A Abrasion, left knee, initial encounter: Secondary | ICD-10-CM | POA: Diagnosis not present

## 2015-05-17 LAB — CBC WITH DIFFERENTIAL/PLATELET
BASOS PCT: 1 %
Basophils Absolute: 0 10*3/uL (ref 0–0.1)
EOS ABS: 0.1 10*3/uL (ref 0–0.7)
Eosinophils Relative: 2 %
HEMATOCRIT: 36.8 % (ref 35.0–47.0)
HEMOGLOBIN: 12 g/dL (ref 12.0–16.0)
LYMPHS ABS: 2.4 10*3/uL (ref 1.0–3.6)
Lymphocytes Relative: 28 %
MCH: 29.2 pg (ref 26.0–34.0)
MCHC: 32.6 g/dL (ref 32.0–36.0)
MCV: 89.6 fL (ref 80.0–100.0)
MONOS PCT: 7 %
Monocytes Absolute: 0.6 10*3/uL (ref 0.2–0.9)
NEUTROS ABS: 5.4 10*3/uL (ref 1.4–6.5)
NEUTROS PCT: 62 %
Platelets: 217 10*3/uL (ref 150–440)
RBC: 4.11 MIL/uL (ref 3.80–5.20)
RDW: 12.9 % (ref 11.5–14.5)
WBC: 8.7 10*3/uL (ref 3.6–11.0)

## 2015-05-17 LAB — POCT PREGNANCY, URINE: Preg Test, Ur: NEGATIVE

## 2015-05-17 LAB — BASIC METABOLIC PANEL
Anion gap: 8 (ref 5–15)
BUN: 14 mg/dL (ref 6–20)
CALCIUM: 8.9 mg/dL (ref 8.9–10.3)
CHLORIDE: 102 mmol/L (ref 101–111)
CO2: 25 mmol/L (ref 22–32)
Creatinine, Ser: 0.92 mg/dL (ref 0.44–1.00)
GLUCOSE: 96 mg/dL (ref 65–99)
POTASSIUM: 3.9 mmol/L (ref 3.5–5.1)
SODIUM: 135 mmol/L (ref 135–145)

## 2015-05-17 LAB — MAGNESIUM: Magnesium: 2.2 mg/dL (ref 1.7–2.4)

## 2015-05-17 NOTE — Discharge Instructions (Signed)
Seizure, Adult A seizure is abnormal electrical activity in the brain. Seizures usually last from 30 seconds to 2 minutes. There are various types of seizures. Before a seizure, you may have a warning sensation (aura) that a seizure is about to occur. An aura may include the following symptoms:   Fear or anxiety.  Nausea.  Feeling like the room is spinning (vertigo).  Vision changes, such as seeing flashing lights or spots. Common symptoms during a seizure include:  A change in attention or behavior (altered mental status).  Convulsions with rhythmic jerking movements.  Drooling.  Rapid eye movements.  Grunting.  Loss of bladder and bowel control.  Bitter taste in the mouth.  Tongue biting. After a seizure, you may feel confused and sleepy. You may also have an injury resulting from convulsions during the seizure. HOME CARE INSTRUCTIONS   If you are given medicines, take them exactly as prescribed by your health care provider.  Keep all follow-up appointments as directed by your health care provider.  Do not swim or drive or engage in risky activity during which a seizure could cause further injury to you or others until your health care provider says it is OK.  Get adequate rest.  Teach friends and family what to do if you have a seizure. They should:  Lay you on the ground to prevent a fall.  Put a cushion under your head.  Loosen any tight clothing around your neck.  Turn you on your side. If vomiting occurs, this helps keep your airway clear.  Stay with you until you recover.  Know whether or not you need emergency care. SEEK IMMEDIATE MEDICAL CARE IF:  The seizure lasts longer than 5 minutes.  The seizure is severe or you do not wake up immediately after the seizure.  You have an altered mental status after the seizure.  You are having more frequent or worsening seizures. Someone should drive you to the emergency department or call local emergency  services (911 in [place] MAKE SURE YOU:  Understand these instructions.  Will watch your condition.  Will get help right away if you are not doing well or get worse.   This information is not intended to replace advice given to you by your health care provider. Make sure you discuss any questions you have with your health care provider.   Document Released: 05/11/2000 Document Revised: 06/04/2014 Document Reviewed: 12/24/2012 Elsevier Interactive Patient Education [employer]2016 Elsevier Inc.  Please return immediately if condition worsens. Please contact her primary physician or the physician you were given for referral. If you have any specialist physicians involved in her treatment and plan please also contact them. Thank you for using [place] [facility].

## 2015-05-17 NOTE — ED Notes (Signed)
Patient states that last night she fell in the night. Patient states she doesn't remember the fall, states her father stated that she came out to sit with him for 10 minutes to watch TV, but didn't say anything. Patient  C/o left knee pain, left shoulder, left side of neck abrasion and left side of head pain. Patient ambulated to triage with a steady gait, states she drove here.

## 2015-05-18 DIAGNOSIS — R569 Unspecified convulsions: Secondary | ICD-10-CM | POA: Insufficient documentation

## 2015-05-18 NOTE — ED Provider Notes (Signed)
Time Seen: Approximately 1850  I have reviewed the triage notes  Chief Complaint: Fall   History of Present Illness: Kristina Schultz is a 44 y.o. female *who presents after an episode last night where she fell and does not recall any circumstances around the fall and apparently came out and sat with her father and is not remembering any of these circumstances. Apparently her mother heard the fall and assumed that it may be her father. There was no witnessed seizure activity though the patient this morning noticed that she had an injury to the left side of her tongue and had urinated in the bed. She complains of left shoulder pain and a mild left-sided headache and apparently had an abrasion on her left knee. She has been able to ambulate on the left lower extremity without difficulty. She has no history of seizure activity.   Past Medical History  Diagnosis Date  . Hypertension   . Asthma   . Bipolar 1 disorder (HCC)     There are no active problems to display for this patient.   Past Surgical History  Procedure Laterality Date  . Cyst removal trunk      Past Surgical History  Procedure Laterality Date  . Cyst removal trunk      Current Outpatient Rx  Name  Route  Sig  Dispense  Refill  . albuterol (PROVENTIL HFA;VENTOLIN HFA) 108 (90 BASE) MCG/ACT inhaler   Inhalation   Inhale 2 puffs into the lungs every 6 (six) hours as needed for wheezing or shortness of breath.   1 Inhaler   2   . diphenhydrAMINE (BENADRYL) 25 mg capsule   Oral   Take 1 capsule (25 mg total) by mouth every 4 (four) hours as needed.   30 capsule   0   . HYDROcodone-acetaminophen (NORCO/VICODIN) 5-325 MG per tablet   Oral   Take 1 tablet by mouth every 6 (six) hours as needed for moderate pain.   15 tablet   0   . ondansetron (ZOFRAN ODT) 4 MG disintegrating tablet   Oral   Take 1 tablet (4 mg total) by mouth every 6 (six) hours as needed for nausea or vomiting.   20 tablet   0   .  predniSONE (DELTASONE) 50 MG tablet   Oral   Take 1 tablet (50 mg total) by mouth daily with breakfast.   3 tablet   0   . ranitidine (ZANTAC) 150 MG tablet   Oral   Take 1 tablet (150 mg total) by mouth 2 (two) times daily.   30 tablet   0   . triamcinolone ointment (KENALOG) 0.5 %   Topical   Apply 1 application topically 2 (two) times daily.   30 g   0     Allergies:  Celexa; Penicillins; and Sulfa antibiotics  Family History: No family history on file.  Social History: Social History  Substance Use Topics  . Smoking status: Current Every Day Smoker  . Smokeless tobacco: None  . Alcohol Use: No     Review of Systems:   10 point review of systems was performed and was otherwise negative:  Constitutional: No fever Eyes: No visual disturbances ENT: No sore throat, ear pain Cardiac: No chest pain Respiratory: No shortness of breath, wheezing, or stridor Abdomen: No abdominal pain, no vomiting, No diarrhea Endocrine: No weight loss, No night sweats Extremities: No peripheral edema, cyanosis Skin: No rashes, easy bruising Neurologic: No focal weakness, trouble with  speech or swollowing Urologic: No dysuria, Hematuria, or urinary frequency Patient denies any new medications. She denies any illicit drug usage. Physical Exam:  ED Triage Vitals  Enc Vitals Group     BP 05/17/15 1734 125/76 mmHg     Pulse Rate 05/17/15 1734 72     Resp 05/17/15 1734 18     Temp 05/17/15 1734 97.9 F (36.6 C)     Temp Source 05/17/15 1734 Oral     SpO2 05/17/15 1734 97 %     Weight 05/17/15 1734 270 lb (122.471 kg)     Height 05/17/15 1734 6' (1.829 m)     Head Cir --      Peak Flow --      Pain Score 05/17/15 1735 7     Pain Loc --      Pain Edu? --      Excl. in GC? --     General: Awake , Alert , and Oriented times 3; GCS 15 Head: Normal cephalic , atraumatic midface is stable Eyes: Pupils equal , round, reactive to light extraocular eye movements  intact Nose/Throat: No nasal drainage, patent upper airway without erythema or exudate. Patient has a crush injury to the lateral surface of her tongue. Neck: Supple, Full range of motion, No anterior adenopathy or palpable thyroid masses Lungs: Clear to ascultation without wheezes , rhonchi, or rales Heart: Regular rate, regular rhythm without murmurs , gallops , or rubs Abdomen: Soft, non tender without rebound, guarding , or rigidity; bowel sounds positive and symmetric in all 4 quadrants. No organomegaly .        Extremities: Contusion mainly over the left shoulder and left trapezius area without crepitus or step-off noted extremities are all neurovascularly intact. The left knee shows an abrasion with normal alignment of the patella with no crepitus or step-off noted and also neurovascularly intact. Neurologic: normal ambulation, Motor symmetric without deficits, sensory intact Skin: warm, dry, no rashes   Labs:   All laboratory work was reviewed including any pertinent negatives or positives listed below:  Labs Reviewed  CBC WITH DIFFERENTIAL/PLATELET  BASIC METABOLIC PANEL  MAGNESIUM  POC URINE PREG, ED  POCT PREGNANCY, URINE   View laboratory work showed no significant findings  Radiology:    Narrative:    CLINICAL DATA: Larey SeatFell last evening and injured left shoulder.  EXAM: LEFT SHOULDER - 2+ VIEW  COMPARISON: None.  FINDINGS: The joint spaces are maintained. No acute bony findings or bone lesion. No abnormal soft tissue calcifications. The visualized lung is clear and the visualized ribs are intact.  IMPRESSION: No fracture or dislocation.   Electronically Signed By: Rudie MeyerP. Gallerani M.D. On: 05/17/2015 22:34          CT Head Wo Contrast (Final result) Result time: 05/17/15 22:18:58   Final result by Rad Results In Interface (05/17/15 22:18:58)   Narrative:   CLINICAL DATA: 44 year old female with fall and trauma to the head.  EXAM: CT HEAD  WITHOUT CONTRAST  TECHNIQUE: Contiguous axial images were obtained from the base of the skull through the vertex without intravenous contrast.  COMPARISON: Head CT dated 06/10/2011  FINDINGS: The ventricles and the sulci are appropriate in size for the patient's age. There is no intracranial hemorrhage. No midline shift or mass effect identified. The gray-white matter differentiation is preserved.  The visualized paranasal sinuses and mastoid air cells are well aerated. There is a stable thinning of the suboccipital calvarium. The calvarium is intact.  IMPRESSION: No acute  intracranial pathology.   Electronically Signed By: Elgie Collard M.D. On: 05/17/2015 22:18       I personally reviewed the radiologic studies  *    ED Course:  Patient's workup was mainly around her acute closed head injury along with possibility of seizure activity. Her head CT shows no abnormalities either from a traumatic standpoint or a seizure standpoint. Given her amnestic events her urination and also the biting other tong that he was suspected that she may have had a new onset seizure. Patient does not appear to have any indications of status epilepticus and I felt she could be treated on an outpatient basis. Over-the-counter pain medications for her shoulder discomfort etc. Does not appear to have an acute injury such as fracture or dislocation. The left knee has an abrasion but otherwise with her being ambulatory and felt that did not require any immediate x-ray evaluation. Patient was referred back to her primary physician but mainly to neurology and was advised that this may be no onset seizure or not she should not drive or do any activities that put herself or others at risk.    Assessment: New-onset seizure  Final Clinical Impression: *  Final diagnoses:  Seizure [facility])     Plan:  Outpatient management Patient was advised to return immediately if condition worsens. Patient  was advised to follow up with their primary care physician or other specialized physicians involved in their outpatient care            Jennye Moccasin, MD 05/18/15 [phone]

## 2015-05-29 DIAGNOSIS — R569 Unspecified convulsions: Secondary | ICD-10-CM

## 2015-05-29 HISTORY — DX: Unspecified convulsions: R56.9

## 2015-09-03 ENCOUNTER — Emergency Department
Admission: EM | Admit: 2015-09-03 | Discharge: 2015-09-03 | Disposition: A | Discharge status: Home / Self Care | Payer: Self-pay | Attending: Emergency Medicine | Admitting: Emergency Medicine

## 2015-09-03 ENCOUNTER — Encounter: Payer: Self-pay | Admitting: Emergency Medicine

## 2015-09-03 DIAGNOSIS — Z7952 Long term (current) use of systemic steroids: Secondary | ICD-10-CM | POA: Insufficient documentation

## 2015-09-03 DIAGNOSIS — Z79899 Other long term (current) drug therapy: Secondary | ICD-10-CM | POA: Insufficient documentation

## 2015-09-03 DIAGNOSIS — K859 Acute pancreatitis without necrosis or infection, unspecified: Secondary | ICD-10-CM | POA: Insufficient documentation

## 2015-09-03 DIAGNOSIS — K858 Other acute pancreatitis without necrosis or infection: Secondary | ICD-10-CM

## 2015-09-03 DIAGNOSIS — F17210 Nicotine dependence, cigarettes, uncomplicated: Secondary | ICD-10-CM | POA: Insufficient documentation

## 2015-09-03 DIAGNOSIS — I10 Essential (primary) hypertension: Secondary | ICD-10-CM | POA: Insufficient documentation

## 2015-09-03 DIAGNOSIS — J45909 Unspecified asthma, uncomplicated: Secondary | ICD-10-CM | POA: Insufficient documentation

## 2015-09-03 DIAGNOSIS — F319 Bipolar disorder, unspecified: Secondary | ICD-10-CM | POA: Insufficient documentation

## 2015-09-03 LAB — URINALYSIS COMPLETE WITH MICROSCOPIC (ARMC ONLY)
BILIRUBIN URINE: NEGATIVE
Bacteria, UA: NONE SEEN
Glucose, UA: NEGATIVE mg/dL
Ketones, ur: NEGATIVE mg/dL
Leukocytes, UA: NEGATIVE
Nitrite: NEGATIVE
PH: 7 (ref 5.0–8.0)
Protein, ur: 100 mg/dL — AB
Specific Gravity, Urine: 1.021 (ref 1.005–1.030)

## 2015-09-03 LAB — LIPASE, BLOOD: Lipase: 107 U/L — ABNORMAL HIGH (ref 11–51)

## 2015-09-03 LAB — COMPREHENSIVE METABOLIC PANEL
ALBUMIN: 4.4 g/dL (ref 3.5–5.0)
ALK PHOS: 86 U/L (ref 38–126)
ALT: 15 U/L (ref 14–54)
AST: 22 U/L (ref 15–41)
Anion gap: 9 (ref 5–15)
BILIRUBIN TOTAL: 0.5 mg/dL (ref 0.3–1.2)
BUN: 10 mg/dL (ref 6–20)
CALCIUM: 9.5 mg/dL (ref 8.9–10.3)
CO2: 23 mmol/L (ref 22–32)
CREATININE: 0.94 mg/dL (ref 0.44–1.00)
Chloride: 99 mmol/L — ABNORMAL LOW (ref 101–111)
GFR calc Af Amer: >60 mL/min (ref >60)
GFR calc non Af Amer: >60 mL/min (ref >60)
GLUCOSE: 126 mg/dL — AB (ref 65–99)
Potassium: 4 mmol/L (ref 3.5–5.1)
SODIUM: 131 mmol/L — AB (ref 135–145)
TOTAL PROTEIN: 8.4 g/dL — AB (ref 6.5–8.1)

## 2015-09-03 LAB — CBC
HCT: 40 % (ref 35.0–47.0)
Hemoglobin: 13.1 g/dL (ref 12.0–16.0)
MCH: 29.1 pg (ref 26.0–34.0)
MCHC: 32.8 g/dL (ref 32.0–36.0)
MCV: 88.5 fL (ref 80.0–100.0)
Platelets: 240 10*3/uL (ref 150–440)
RBC: 4.52 MIL/uL (ref 3.80–5.20)
RDW: 13.2 % (ref 11.5–14.5)
WBC: 8.3 10*3/uL (ref 3.6–11.0)

## 2015-09-03 LAB — POCT PREGNANCY, URINE: Preg Test, Ur: NEGATIVE

## 2015-09-03 MED ORDER — OXYCODONE-ACETAMINOPHEN 5-325 MG PO TABS: 1.0000 | ORAL_TABLET | Freq: Four times a day (QID) | ORAL | Status: DC | PRN

## 2015-09-03 MED ORDER — PROMETHAZINE HCL 25 MG PO TABS: 25.0000 mg | ORAL_TABLET | Freq: Four times a day (QID) | ORAL | Status: DC | PRN

## 2015-09-03 MED ORDER — RANITIDINE HCL 150 MG PO CAPS: 150.0000 mg | ORAL_CAPSULE | Freq: Two times a day (BID) | ORAL | Status: DC

## 2015-09-03 NOTE — ED Notes (Signed)
Pt to ed with c/o left sided abd pain x 1 week.  Pt reports hx of pancreatitis.

## 2015-09-03 NOTE — ED Provider Notes (Signed)
[facility] Emergency Department Provider Note  ____________________________________________  Time seen: 4:15 PM  I have reviewed the triage vital signs and the nursing notes.   HISTORY  Chief Complaint Abdominal Pain    HPI Kristina Schultz is a 45 y.o. female who complains of epigastric abdominal pain for the past week. Gradual onset. Related to increased intake of fatty foods recently. It's been constant aching, radiating to her back. No chest pain shortness breath fever chills or vomiting. No diarrhea. Started having a clear liquid diet and is continuing to tolerate fluids and oral intake with this. No dizziness. Has a history of pancreatitis and this feels similar. No history of biliary disease.Pain currently mild in intensity.     Past Medical History  Diagnosis Date  . Hypertension   . Asthma   . Bipolar 1 disorder (HCC)      There are no active problems to display for this patient.    Past Surgical History  Procedure Laterality Date  . Cyst removal trunk       Current Outpatient Rx  Name  Route  Sig  Dispense  Refill  . albuterol (PROVENTIL HFA;VENTOLIN HFA) 108 (90 BASE) MCG/ACT inhaler   Inhalation   Inhale 2 puffs into the lungs every 6 (six) hours as needed for wheezing or shortness of breath.   1 Inhaler   2   . diphenhydrAMINE (BENADRYL) 25 mg capsule   Oral   Take 1 capsule (25 mg total) by mouth every 4 (four) hours as needed.   30 capsule   0   . HYDROcodone-acetaminophen (NORCO/VICODIN) 5-325 MG per tablet   Oral   Take 1 tablet by mouth every 6 (six) hours as needed for moderate pain.   15 tablet   0   . ondansetron (ZOFRAN ODT) 4 MG disintegrating tablet   Oral   Take 1 tablet (4 mg total) by mouth every 6 (six) hours as needed for nausea or vomiting.   20 tablet   0   . oxyCODONE-acetaminophen (ROXICET) 5-325 MG tablet   Oral   Take 1 tablet by mouth every 6 (six) hours as needed for severe pain.   12  tablet   0   . predniSONE (DELTASONE) 50 MG tablet   Oral   Take 1 tablet (50 mg total) by mouth daily with breakfast.   3 tablet   0   . promethazine (PHENERGAN) 25 MG tablet   Oral   Take 1 tablet (25 mg total) by mouth every 6 (six) hours as needed for nausea or vomiting.   15 tablet   0   . ranitidine (ZANTAC) 150 MG capsule   Oral   Take 1 capsule (150 mg total) by mouth 2 (two) times daily.   28 capsule   0   . triamcinolone ointment (KENALOG) 0.5 %   Topical   Apply 1 application topically 2 (two) times daily.   30 g   0      Allergies Atenolol; Celexa; Penicillins; and Sulfa antibiotics   History reviewed. No pertinent family history.  Social History Social History  Substance Use Topics  . Smoking status: Current Every Day Smoker    Types: Cigarettes  . Smokeless tobacco: None  . Alcohol Use: No    Review of Systems  Constitutional:   No fever or chills. No weight changes Eyes:   No vision changes.  ENT:   No sore throat. No rhinorrhea. Cardiovascular:   No chest pain.  Respiratory:   No dyspnea or cough. Gastrointestinal:  Positive abdominal pain as above without, vomiting and diarrhea.  No BRBPR or melena. Genitourinary:   Negative for dysuria or difficulty urinating. Musculoskeletal:   Negative for focal pain or swelling Skin:   Negative for rash. Neurological:   Negative for headaches, focal weakness or numbness.  10-point ROS otherwise negative.  ____________________________________________   PHYSICAL EXAM:  VITAL SIGNS: ED Triage Vitals  Enc Vitals Group     BP 09/03/15 1316 142/83 mmHg     Pulse Rate 09/03/15 1316 89     Resp 09/03/15 1316 18     Temp 09/03/15 1316 98.1 F (36.7 C)     Temp Source 09/03/15 1316 Oral     SpO2 09/03/15 1316 99 %     Weight 09/03/15 1316 270 lb (122.471 kg)     Height 09/03/15 1316 6' (1.829 m)     Head Cir --      Peak Flow --      Pain Score 09/03/15 1317 7     Pain Loc --      Pain Edu? --       Excl. in GC? --     Vital signs reviewed, nursing assessments reviewed.   Constitutional:   Alert and oriented. Well appearing and in no distress. Eyes:   No scleral icterus. No conjunctival pallor. PERRL. EOMI ENT   Head:   Normocephalic and atraumatic.   Nose:   No congestion/rhinnorhea. No septal hematoma   Mouth/Throat:   MMM, no pharyngeal erythema. No peritonsillar mass.    Neck:   No stridor. No SubQ emphysema. No meningismus. Hematological/Lymphatic/Immunilogical:   No cervical lymphadenopathy. Cardiovascular:   RRR. Symmetric bilateral radial and DP pulses.  No murmurs.  Respiratory:   Normal respiratory effort without tachypnea nor retractions. Breath sounds are clear and equal bilaterally. No wheezes/rales/rhonchi. Gastrointestinal:   Soft with epigastric and left upper quadrant tenderness, mild.. Non distended. There is no CVA tenderness.  No rebound, rigidity, or guarding. Genitourinary:   deferred Musculoskeletal:   Nontender with normal range of motion in all extremities. No joint effusions.  No lower extremity tenderness.  No edema. Neurologic:   Normal speech and language.  CN 2-10 normal. Motor grossly intact. No gross focal neurologic deficits are appreciated.  Skin:    Skin is warm, dry and intact. No rash noted.  No petechiae, purpura, or bullae. Psychiatric:   Mood and affect are normal. ____________________________________________    LABS (pertinent positives/negatives) (all labs ordered are listed, but only abnormal results are displayed) Labs Reviewed  LIPASE, BLOOD - Abnormal; Notable for the following:    Lipase 107 (*)    All other components within normal limits  COMPREHENSIVE METABOLIC PANEL - Abnormal; Notable for the following:    Sodium 131 (*)    Chloride 99 (*)    Glucose, Bld 126 (*)    Total Protein 8.4 (*)    All other components within normal limits  URINALYSIS COMPLETEWITH MICROSCOPIC ([facility] ONLY) - Abnormal; Notable  for the following:    Color, Urine YELLOW (*)    APPearance CLEAR (*)    Hgb urine dipstick 2+ (*)    Protein, ur 100 (*)    Squamous Epithelial / LPF 0-5 (*)    All other components within normal limits  CBC  POC URINE PREG, ED  POCT PREGNANCY, URINE   ____________________________________________   EKG   ____________________________________________    RADIOLOGY    ____________________________________________  PROCEDURES   ____________________________________________   INITIAL IMPRESSION / ASSESSMENT AND PLAN / ED COURSE  Pertinent labs & imaging results that were available during my care of the patient were reviewed by me and considered in my medical decision making (see chart for details).  Patient presents with epigastric pain concerning for recurrent pancreatitis. Labs are unremarkable, vitals do show a mild elevation of lipase. Overall not severe. Exam is reassuring in that it is not consistent with acute cholecystitis or cholangitis, but is consistent with gastritis or pancreatitis. She has continued to tolerate oral intake and symptoms are controlled. I'll give her oral antiemetics and analgesics as well as antacids and have her continue the clear liquid diet for concentrating on hydration and follow up with primary care. She is amenable to this plan and prefers to manage this at home. Low suspicion for AAA perforation or obstruction.     ____________________________________________   FINAL CLINICAL IMPRESSION(S) / ED DIAGNOSES  Final diagnoses:  Other acute pancreatitis      Sharman CheekPhillip Killian Ress, MD 09/03/15 [id number]

## 2015-09-03 NOTE — Discharge Instructions (Signed)
You were prescribed a medication that is potentially sedating. Do not drink alcohol, drive or participate in any other potentially dangerous activities while taking this medication as it may make you sleepy. Do not take this medication with any other sedating medications, either prescription or over-the-counter. If you were prescribed Percocet or Vicodin, do not take these with acetaminophen (Tylenol) as it is already contained within these medications.   Opioid pain medications (or "narcotics") can be habit forming.  Use it as little as possible to achieve adequate pain control.  Do not use or use it with extreme caution if you have a history of opiate abuse or dependence.  If you are on a pain contract with your primary care doctor or a pain specialist, be sure to let them know you were prescribed this medication today from the [facility].  This medication is intended for your use only - do not give any to anyone else and keep it in a secure place where nobody else, especially children and pets, have access to it.  It will also cause or worsen constipation, so you may want to consider taking an over-the-counter stool softener while you are taking this medication.  Acute Pancreatitis Acute pancreatitis is a disease in which the pancreas becomes suddenly inflamed. The pancreas is a large gland located behind your stomach. The pancreas produces enzymes that help digest food. The pancreas also releases the hormones glucagon and insulin that help regulate blood sugar. Damage to the pancreas occurs when the digestive enzymes from the pancreas are activated and begin attacking the pancreas before being released into the intestine. Most acute attacks last a couple of days and can cause serious complications. Some people become dehydrated and develop low blood pressure. In severe cases, bleeding into the pancreas can lead to shock and can be life-threatening. The lungs, heart, and kidneys  may fail. CAUSES  Pancreatitis can happen to anyone. In some cases, the cause is unknown. Most cases are caused by:  Alcohol abuse.  Gallstones. Other less common causes are:  Certain medicines.  Exposure to certain chemicals.  Infection.  Damage caused by an accident (trauma).  Abdominal surgery. SYMPTOMS   Pain in the upper abdomen that may radiate to the back.  Tenderness and swelling of the abdomen.  Nausea and vomiting. DIAGNOSIS  Your caregiver will perform a physical exam. Blood and stool tests may be done to confirm the diagnosis. Imaging tests may also be done, such as X-rays, CT scans, or an ultrasound of the abdomen. TREATMENT  Treatment usually requires a stay in the hospital. Treatment may include:  Pain medicine.  Fluid replacement through an intravenous line (IV).  Placing a tube in the stomach to remove stomach contents and control vomiting.  Not eating for 3 or 4 days. This gives your pancreas a rest, because enzymes are not being produced that can cause further damage.  Antibiotic medicines if your condition is caused by an infection.  Surgery of the pancreas or gallbladder. HOME CARE INSTRUCTIONS   Follow the diet advised by your caregiver. This may involve avoiding alcohol and decreasing the amount of fat in your diet.  Eat smaller, more frequent meals. This reduces the amount of digestive juices the pancreas produces.  Drink enough fluids to keep your urine clear or pale yellow.  Only take over-the-counter or prescription medicines as directed by your caregiver.  Avoid drinking alcohol if it caused your condition.  Do not smoke.  Get plenty of rest.  Check your blood sugar at home as directed by your caregiver.  Keep all follow-up appointments as directed by your caregiver. SEEK MEDICAL CARE IF:   You do not recover as quickly as expected.  You develop new or worsening symptoms.  You have persistent pain, weakness, or  nausea.  You recover and then have another episode of pain. SEEK IMMEDIATE MEDICAL CARE IF:   You are unable to eat or keep fluids down.  Your pain becomes severe.  You have a fever or persistent symptoms for more than 2 to 3 days.  You have a fever and your symptoms suddenly get worse.  Your skin or the white part of your eyes turn yellow (jaundice).  You develop vomiting.  You feel dizzy, or you faint.  Your blood sugar is high (over 300 mg/dL). MAKE SURE YOU:   Understand these instructions.  Will watch your condition.  Will get help right away if you are not doing well or get worse.   This information is not intended to replace advice given to you by your health care provider. Make sure you discuss any questions you have with your health care provider.   Document Released: 05/14/2005 Document Revised: 11/13/2011 Document Reviewed: 08/23/2011 Elsevier Interactive Patient Education 2016 [employer]  Low-Fat Diet for Pancreatitis or Gallbladder Conditions A low-fat diet can be helpful if you have pancreatitis or a gallbladder condition. With these conditions, your pancreas and gallbladder have trouble digesting fats. A healthy eating plan with less fat will help rest your pancreas and gallbladder and reduce your symptoms. WHAT DO I NEED TO KNOW ABOUT THIS DIET?  Eat a low-fat diet.  Reduce your fat intake to less than 20-30% of your total daily calories. This is less than 50-60 g of fat per day.  Remember that you need some fat in your diet. Ask your dietician what your daily goal should be.  Choose nonfat and low-fat healthy foods. Look for the words "nonfat," "low fat," or "fat free."  As a guide, look on the label and choose foods with less than 3 g of fat per serving. Eat only one serving.  Avoid alcohol.  Do not smoke. If you need help quitting, talk with your health care provider.  Eat small frequent meals instead of three large heavy meals. WHAT FOODS  CAN I EAT? Grains Include healthy grains and starches such as potatoes, wheat bread, fiber-rich cereal, and brown rice. Choose whole grain options whenever possible. In adults, whole grains should account for 45-65% of your daily calories.  Fruits and Vegetables Eat plenty of fruits and vegetables. Fresh fruits and vegetables add fiber to your diet. Meats and Other Protein Sources Eat lean meat such as chicken and pork. Trim any fat off of meat before cooking it. Eggs, fish, and beans are other sources of protein. In adults, these foods should account for 10-35% of your daily calories. Dairy Choose low-fat milk and dairy options. Dairy includes fat and protein, as well as calcium.  Fats and Oils Limit high-fat foods such as fried foods, sweets, baked goods, sugary drinks.  Other Creamy sauces and condiments, such as mayonnaise, can add extra fat. Think about whether or not you need to use them, or use smaller amounts or low fat options. WHAT FOODS ARE NOT RECOMMENDED?  High fat foods, such as:  [employer].  Ice cream.  [place] toast.  Sweet rolls.  Pizza.  Cheese bread.  Foods covered with batter, butter, creamy sauces, or cheese.  Fried foods.  Sugary drinks and desserts.  Foods that cause gas or bloating   This information is not intended to replace advice given to you by your health care provider. Make sure you discuss any questions you have with your health care provider.   Document Released: 05/19/2013 Document Reviewed: 05/19/2013 Elsevier Interactive Patient Education [employer]

## 2015-09-04 ENCOUNTER — Emergency Department
Admission: EM | Admit: 2015-09-04 | Discharge: 2015-09-04 | Disposition: A | Discharge status: Home / Self Care | Payer: Self-pay | Attending: Emergency Medicine | Admitting: Emergency Medicine

## 2015-09-04 ENCOUNTER — Encounter: Payer: Self-pay | Admitting: Emergency Medicine

## 2015-09-04 ENCOUNTER — Emergency Department: Payer: Self-pay

## 2015-09-04 DIAGNOSIS — I10 Essential (primary) hypertension: Secondary | ICD-10-CM | POA: Insufficient documentation

## 2015-09-04 DIAGNOSIS — Z79899 Other long term (current) drug therapy: Secondary | ICD-10-CM | POA: Insufficient documentation

## 2015-09-04 DIAGNOSIS — N938 Other specified abnormal uterine and vaginal bleeding: Secondary | ICD-10-CM | POA: Insufficient documentation

## 2015-09-04 DIAGNOSIS — N939 Abnormal uterine and vaginal bleeding, unspecified: Secondary | ICD-10-CM

## 2015-09-04 DIAGNOSIS — K859 Acute pancreatitis without necrosis or infection, unspecified: Secondary | ICD-10-CM | POA: Insufficient documentation

## 2015-09-04 DIAGNOSIS — F17210 Nicotine dependence, cigarettes, uncomplicated: Secondary | ICD-10-CM | POA: Insufficient documentation

## 2015-09-04 DIAGNOSIS — J45909 Unspecified asthma, uncomplicated: Secondary | ICD-10-CM | POA: Insufficient documentation

## 2015-09-04 DIAGNOSIS — F319 Bipolar disorder, unspecified: Secondary | ICD-10-CM | POA: Insufficient documentation

## 2015-09-04 LAB — URINALYSIS COMPLETE WITH MICROSCOPIC (ARMC ONLY)
BACTERIA UA: NONE SEEN
Bilirubin Urine: NEGATIVE
Glucose, UA: NEGATIVE mg/dL
KETONES UR: NEGATIVE mg/dL
LEUKOCYTES UA: NEGATIVE
NITRITE: NEGATIVE
PH: 6 (ref 5.0–8.0)
PROTEIN: 100 mg/dL — AB
SPECIFIC GRAVITY, URINE: 1.021 (ref 1.005–1.030)

## 2015-09-04 NOTE — Discharge Instructions (Signed)
Acute Pancreatitis Acute pancreatitis is a disease in which the pancreas becomes suddenly inflamed. The pancreas is a large gland located behind your stomach. The pancreas produces enzymes that help digest food. The pancreas also releases the hormones glucagon and insulin that help regulate blood sugar. Damage to the pancreas occurs when the digestive enzymes from the pancreas are activated and begin attacking the pancreas before being released into the intestine. Most acute attacks last a couple of days and can cause serious complications. Some people become dehydrated and develop low blood pressure. In severe cases, bleeding into the pancreas can lead to shock and can be life-threatening. The lungs, heart, and kidneys may fail. CAUSES  Pancreatitis can happen to anyone. In some cases, the cause is unknown. Most cases are caused by:  Alcohol abuse.  Gallstones. Other less common causes are:  Certain medicines.  Exposure to certain chemicals.  Infection.  Damage caused by an accident (trauma).  Abdominal surgery. SYMPTOMS   Pain in the upper abdomen that may radiate to the back.  Tenderness and swelling of the abdomen.  Nausea and vomiting. DIAGNOSIS  Your caregiver will perform a physical exam. Blood and stool tests may be done to confirm the diagnosis. Imaging tests may also be done, such as X-rays, CT scans, or an ultrasound of the abdomen. TREATMENT  Treatment usually requires a stay in the hospital. Treatment may include:  Pain medicine.  Fluid replacement through an intravenous line (IV).  Placing a tube in the stomach to remove stomach contents and control vomiting.  Not eating for 3 or 4 days. This gives your pancreas a rest, because enzymes are not being produced that can cause further damage.  Antibiotic medicines if your condition is caused by an infection.  Surgery of the pancreas or gallbladder. HOME CARE INSTRUCTIONS   Follow the diet advised by your  caregiver. This may involve avoiding alcohol and decreasing the amount of fat in your diet.  Eat smaller, more frequent meals. This reduces the amount of digestive juices the pancreas produces.  Drink enough fluids to keep your urine clear or pale yellow.  Only take over-the-counter or prescription medicines as directed by your caregiver.  Avoid drinking alcohol if it caused your condition.  Do not smoke.  Get plenty of rest.  Check your blood sugar at home as directed by your caregiver.  Keep all follow-up appointments as directed by your caregiver. SEEK MEDICAL CARE IF:   You do not recover as quickly as expected.  You develop new or worsening symptoms.  You have persistent pain, weakness, or nausea.  You recover and then have another episode of pain. SEEK IMMEDIATE MEDICAL CARE IF:   You are unable to eat or keep fluids down.  Your pain becomes severe.  You have a fever or persistent symptoms for more than 2 to 3 days.  You have a fever and your symptoms suddenly get worse.  Your skin or the white part of your eyes turn yellow (jaundice).  You develop vomiting.  You feel dizzy, or you faint.  Your blood sugar is high (over 300 mg/dL). MAKE SURE YOU:   Understand these instructions.  Will watch your condition.  Will get help right away if you are not doing well or get worse.   This information is not intended to replace advice given to you by your health care provider. Make sure you discuss any questions you have with your health care provider.   Document Released: 05/14/2005 Document Revised: 11/13/2011  Document Reviewed: 08/23/2011 Elsevier Interactive Patient Education 2016 [employer] Inc[employer]  Abnormal Uterine Bleeding Abnormal uterine bleeding means bleeding from the vagina that is not your normal menstrual period. This can be:  Bleeding or spotting between periods.  Bleeding after sex (sexual intercourse).  Bleeding that is heavier or more than  normal.  Periods that last longer than usual.  Bleeding after menopause. There are many problems that may cause this. Treatment will depend on the cause of the bleeding. Any kind of bleeding that is not normal should be reviewed by your doctor.  HOME CARE Watch your condition for any changes. These actions may lessen any discomfort you are having:  Do not use tampons or douches as told by your doctor.  Change your pads often. You should get regular pelvic exams and Pap tests. Keep all appointments for tests as told by your doctor. GET HELP IF:  You are bleeding for more than 1 week.  You feel dizzy at times. GET HELP RIGHT AWAY IF:   You pass out.  You have to change pads every 15 to 30 minutes.  You have belly pain.  You have a fever.  You become sweaty or weak.  You are passing large blood clots from the vagina.  You feel sick to your stomach (nauseous) and throw up (vomit). MAKE SURE YOU:  Understand these instructions.  Will watch your condition.  Will get help right away if you are not doing well or get worse.   This information is not intended to replace advice given to you by your health care provider. Make sure you discuss any questions you have with your health care provider.   Document Released: 03/11/2009 Document Revised: 05/19/2013 Document Reviewed: 12/11/2012 Elsevier Interactive Patient Education [employer]

## 2015-09-04 NOTE — ED Provider Notes (Signed)
[facility] Emergency Department Provider Note     Time seen: ----------------------------------------- 2:18 PM on 09/04/2015 -----------------------------------------    I have reviewed the triage vital signs and the nursing notes.   HISTORY  Chief Complaint Vaginal Bleeding    HPI Kristina Schultz is a 45 y.o. female who presents to ER for hematuria and vaginal bleeding. Patient states she was seen here yesterday and diagnosed with pancreatitis, states her menstrual cycle started this morning and it was 5 days early. Initially this morning she thought she was having frank hematuria, she was concerned may be missing something because we did not do any imaging yesterday. Patient had read that the medication she was given could possibly cause some complications or bleeding. Patient states abdominal pain is improved from yesterday.   Past Medical History  Diagnosis Date  . Hypertension   . Asthma   . Bipolar 1 disorder (HCC)     There are no active problems to display for this patient.   Past Surgical History  Procedure Laterality Date  . Cyst removal trunk      Allergies Atenolol; Celexa; Penicillins; Prednisone; and Sulfa antibiotics  Social History Social History  Substance Use Topics  . Smoking status: Current Every Day Smoker    Types: Cigarettes  . Smokeless tobacco: None  . Alcohol Use: No    Review of Systems Constitutional: Negative for fever. Eyes: Negative for visual changes. ENT: Negative for sore throat. Cardiovascular: Negative for chest pain. Respiratory: Negative for shortness of breath. Gastrointestinal: Positive for abdominal pain Genitourinary: Negative for dysuria, positive for vaginal bleeding  Musculoskeletal: Negative for back pain. Skin: Negative for rash. Neurological: Negative for headaches, focal weakness or numbness.  10-point ROS otherwise  negative.  ____________________________________________   PHYSICAL EXAM:  VITAL SIGNS: ED Triage Vitals  Enc Vitals Group     BP 09/04/15 1115 127/88 mmHg     Pulse Rate 09/04/15 1115 86     Resp 09/04/15 1115 16     Temp 09/04/15 1115 98 F (36.7 C)     Temp Source 09/04/15 1115 Oral     SpO2 09/04/15 1115 99 %     Weight 09/04/15 1115 270 lb (122.471 kg)     Height 09/04/15 1115 6' (1.829 m)     Head Cir --      Peak Flow --      Pain Score 09/04/15 1116 3     Pain Loc --      Pain Edu? --      Excl. in GC? --     Constitutional: Alert and oriented. Well appearing and in no distress. Eyes: Conjunctivae are normal. PERRL. Normal extraocular movements. ENT   Head: Normocephalic and atraumatic.   Nose: No congestion/rhinnorhea.   Mouth/Throat: Mucous membranes are moist.   Neck: No stridor. Cardiovascular: Normal rate, regular rhythm. Normal and symmetric distal pulses are present in all extremities. No murmurs, rubs, or gallops. Respiratory: Normal respiratory effort without tachypnea nor retractions. Breath sounds are clear and equal bilaterally. No wheezes/rales/rhonchi. Gastrointestinal: Soft and nontender. No distention. No abdominal bruits.  Musculoskeletal: Nontender with normal range of motion in all extremities. No joint effusions.  No lower extremity tenderness nor edema. Neurologic:  Normal speech and language. No gross focal neurologic deficits are appreciated.  Skin:  Skin is warm, dry and intact. No rash noted. Psychiatric: Mood and affect are normal.  ____________________________________________  ED COURSE:  Pertinent labs & imaging results that were available during my  care of the patient were reviewed by me and considered in my medical decision making (see chart for details).  patient's no acute distress, is adamant that we perform some type of imaging for her. I will order a renal protocol CT and reevaluate.   ____________________________________________    LABS (pertinent positives/negatives)  Labs Reviewed  URINALYSIS COMPLETEWITH MICROSCOPIC ([facility] ONLY) - Abnormal; Notable for the following:    Color, Urine AMBER (*)    APPearance HAZY (*)    Hgb urine dipstick 2+ (*)    Protein, ur 100 (*)    Squamous Epithelial / LPF 0-5 (*)    All other components within normal limits    RADIOLOGY Images were viewed by me  CT renal protocol IMPRESSION: 1. Changes of acute pancreatitis without complications. ______________  FINAL ASSESSMENT AND PLAN    pancreatitis, abnormal vaginal bleeding   Plan: Patient with labs and imaging as dictated above. I have advised the patient that she has had a fluctuation in her hormones likely secondary to the pancreatitis. I also advised her that the medication she received yesterday her safe, she is stable for discharge.   Emily Filbert, MD   Emily Filbert, MD 09/04/15 [phone]

## 2015-09-04 NOTE — ED Notes (Signed)
Pt states was here yesterday with pancreatitis - d/c with meds and then started her period approx 5 days early. Pt states is worried we missed something since we never did an ultrasound yesterday. D/c with rx's but states not taking the phenergan since she is on zoloft and read online that she could have a deadly reaction. Urine sample obtained and sent to the lab.

## 2015-09-04 NOTE — ED Notes (Signed)
States dx with pancreatitis yesterday and given rx for oxycodone and promethezine and zantac. States started having vag bleeding and possibly blood in urine. States not time for her period

## 2015-10-30 ENCOUNTER — Emergency Department
Admission: EM | Admit: 2015-10-30 | Discharge: 2015-10-30 | Disposition: A | Discharge status: Home / Self Care | Payer: Self-pay | Attending: Emergency Medicine | Admitting: Emergency Medicine

## 2015-10-30 DIAGNOSIS — Z7952 Long term (current) use of systemic steroids: Secondary | ICD-10-CM | POA: Insufficient documentation

## 2015-10-30 DIAGNOSIS — R1013 Epigastric pain: Secondary | ICD-10-CM

## 2015-10-30 DIAGNOSIS — F17210 Nicotine dependence, cigarettes, uncomplicated: Secondary | ICD-10-CM | POA: Insufficient documentation

## 2015-10-30 DIAGNOSIS — Z79899 Other long term (current) drug therapy: Secondary | ICD-10-CM | POA: Insufficient documentation

## 2015-10-30 DIAGNOSIS — I10 Essential (primary) hypertension: Secondary | ICD-10-CM | POA: Insufficient documentation

## 2015-10-30 DIAGNOSIS — N390 Urinary tract infection, site not specified: Secondary | ICD-10-CM | POA: Insufficient documentation

## 2015-10-30 DIAGNOSIS — F319 Bipolar disorder, unspecified: Secondary | ICD-10-CM | POA: Insufficient documentation

## 2015-10-30 DIAGNOSIS — J45909 Unspecified asthma, uncomplicated: Secondary | ICD-10-CM | POA: Insufficient documentation

## 2015-10-30 LAB — COMPREHENSIVE METABOLIC PANEL
ALT: 16 U/L (ref 14–54)
ANION GAP: 8 (ref 5–15)
AST: 20 U/L (ref 15–41)
Albumin: 4 g/dL (ref 3.5–5.0)
Alkaline Phosphatase: 78 U/L (ref 38–126)
BUN: 13 mg/dL (ref 6–20)
CHLORIDE: 101 mmol/L (ref 101–111)
CO2: 24 mmol/L (ref 22–32)
Calcium: 9.4 mg/dL (ref 8.9–10.3)
Creatinine, Ser: 0.82 mg/dL (ref 0.44–1.00)
Glucose, Bld: 101 mg/dL — ABNORMAL HIGH (ref 65–99)
POTASSIUM: 4.4 mmol/L (ref 3.5–5.1)
SODIUM: 133 mmol/L — AB (ref 135–145)
Total Bilirubin: 0.4 mg/dL (ref 0.3–1.2)
Total Protein: 7.8 g/dL (ref 6.5–8.1)

## 2015-10-30 LAB — CBC
HEMATOCRIT: 37.2 % (ref 35.0–47.0)
HEMOGLOBIN: 12.3 g/dL (ref 12.0–16.0)
MCH: 29.5 pg (ref 26.0–34.0)
MCHC: 33 g/dL (ref 32.0–36.0)
MCV: 89.5 fL (ref 80.0–100.0)
PLATELETS: 222 10*3/uL (ref 150–440)
RBC: 4.15 MIL/uL (ref 3.80–5.20)
RDW: 13.3 % (ref 11.5–14.5)
WBC: 7.5 10*3/uL (ref 3.6–11.0)

## 2015-10-30 LAB — URINALYSIS COMPLETE WITH MICROSCOPIC (ARMC ONLY)
BACTERIA UA: NONE SEEN
BILIRUBIN URINE: NEGATIVE
Glucose, UA: NEGATIVE mg/dL
Ketones, ur: NEGATIVE mg/dL
Nitrite: NEGATIVE
PH: 5 (ref 5.0–8.0)
PROTEIN: NEGATIVE mg/dL
Specific Gravity, Urine: 1.017 (ref 1.005–1.030)

## 2015-10-30 LAB — LIPASE, BLOOD: LIPASE: 100 U/L — AB (ref 11–51)

## 2015-10-30 LAB — PREGNANCY, URINE: Preg Test, Ur: NEGATIVE

## 2015-10-30 MED ORDER — GI COCKTAIL ~~LOC~~
30.0000 mL | Freq: Once | ORAL | Status: AC
  Administered 2015-10-30: 30 mL via ORAL
  Filled 2015-10-30: qty 30

## 2015-10-30 MED ORDER — RANITIDINE HCL 150 MG PO CAPS: 150.0000 mg | ORAL_CAPSULE | Freq: Two times a day (BID) | ORAL | Status: DC

## 2015-10-30 MED ORDER — NITROFURANTOIN MONOHYD MACRO 100 MG PO CAPS: 100.0000 mg | ORAL_CAPSULE | Freq: Two times a day (BID) | ORAL | Status: AC

## 2015-10-30 MED ORDER — OXYCODONE-ACETAMINOPHEN 5-325 MG PO TABS
1.0000 | ORAL_TABLET | Freq: Once | ORAL | Status: AC
  Administered 2015-10-30: 1 via ORAL
  Filled 2015-10-30: qty 1

## 2015-10-30 MED ORDER — TRAMADOL HCL 50 MG PO TABS: 50.0000 mg | ORAL_TABLET | Freq: Four times a day (QID) | ORAL | Status: DC | PRN

## 2015-10-30 NOTE — ED Provider Notes (Addendum)
[facility] Emergency Department Provider Note  ____________________________________________   I have reviewed the triage vital signs and the nursing notes.   HISTORY  Chief Complaint Abdominal Pain    HPI Kristina Schultz is a 45 y.o. female presents today with epigastric abdominal pain. She states that every day for years she has had this pain and sometimes he gets worse and sometimes he gets better. A little worse last night. Patient has had extensive imaging of her abdomen including this year for the same pain. She denies any fever or chills she is not vomiting she does not have any diarrhea no melena no bright red blood per rectum. She has a history of reflux disease and is supposed to be taking antacids but is not regularly taking them. She denies any hematemesis. She denies alcohol abuse. She has not had any fever or chills. Food seems to make the pain worse. She has no right upper quadrant pain. She does not feel that she has had knee jaundice. This is a acute on chronic abdominal pain. Patient states that although she's been referred to GI before she has never followed up with them and never had an endoscopy. At one point several years ago her lipase was 600 but now it is regularly slightly elevated.   Past Medical History  Diagnosis Date  . Hypertension   . Asthma   . Bipolar 1 disorder (HCC)     There are no active problems to display for this patient.   Past Surgical History  Procedure Laterality Date  . Cyst removal trunk      Current Outpatient Rx  Name  Route  Sig  Dispense  Refill  . albuterol (PROVENTIL HFA;VENTOLIN HFA) 108 (90 BASE) MCG/ACT inhaler   Inhalation   Inhale 2 puffs into the lungs every 6 (six) hours as needed for wheezing or shortness of breath.   1 Inhaler   2   . diphenhydrAMINE (BENADRYL) 25 mg capsule   Oral   Take 1 capsule (25 mg total) by mouth every 4 (four) hours as needed.   30 capsule   0   .  HYDROcodone-acetaminophen (NORCO/VICODIN) 5-325 MG per tablet   Oral   Take 1 tablet by mouth every 6 (six) hours as needed for moderate pain.   15 tablet   0   . ondansetron (ZOFRAN ODT) 4 MG disintegrating tablet   Oral   Take 1 tablet (4 mg total) by mouth every 6 (six) hours as needed for nausea or vomiting.   20 tablet   0   . oxyCODONE-acetaminophen (ROXICET) 5-325 MG tablet   Oral   Take 1 tablet by mouth every 6 (six) hours as needed for severe pain.   12 tablet   0   . predniSONE (DELTASONE) 50 MG tablet   Oral   Take 1 tablet (50 mg total) by mouth daily with breakfast.   3 tablet   0   . promethazine (PHENERGAN) 25 MG tablet   Oral   Take 1 tablet (25 mg total) by mouth every 6 (six) hours as needed for nausea or vomiting.   15 tablet   0   . ranitidine (ZANTAC) 150 MG capsule   Oral   Take 1 capsule (150 mg total) by mouth 2 (two) times daily.   28 capsule   0   . triamcinolone ointment (KENALOG) 0.5 %   Topical   Apply 1 application topically 2 (two) times daily.   30  g   0     Allergies Atenolol; Celexa; Penicillins; Prednisone; and Sulfa antibiotics  No family history on file.  Social History Social History  Substance Use Topics  . Smoking status: Current Every Day Smoker    Types: Cigarettes  . Smokeless tobacco: Not on file  . Alcohol Use: No    Review of Systems Constitutional: No fever/chills Eyes: No visual changes. ENT: No sore throat. No stiff neck no neck pain Cardiovascular: Denies chest pain. Respiratory: Denies shortness of breath. Gastrointestinal:   no vomiting.  No diarrhea.  No constipation. Genitourinary: Negative for dysuria. Musculoskeletal: Negative lower extremity swelling Skin: Negative for rash. Neurological: Negative for headaches, focal weakness or numbness. 10-point ROS otherwise negative.  ____________________________________________   PHYSICAL EXAM:  VITAL SIGNS: ED Triage Vitals  Enc Vitals Group      BP 10/30/15 1728 125/73 mmHg     Pulse Rate 10/30/15 1728 83     Resp 10/30/15 1728 20     Temp 10/30/15 1728 97.8 F (36.6 C)     Temp Source 10/30/15 1728 Oral     SpO2 10/30/15 1728 97 %     Weight 10/30/15 1728 270 lb (122.471 kg)     Height 10/30/15 1728 6' (1.829 m)     Head Cir --      Peak Flow --      Pain Score 10/30/15 1730 9     Pain Loc --      Pain Edu? --      Excl. in GC? --     Constitutional: Alert and oriented. Well appearing and in no acute distress. Eyes: Conjunctivae are normal. PERRL. EOMI. Head: Atraumatic. Nose: No congestion/rhinnorhea. Mouth/Throat: Mucous membranes are moist.  Oropharynx non-erythematous. Neck: No stridor.   Nontender with no meningismus Cardiovascular: Normal rate, regular rhythm. Grossly normal heart sounds.  Good peripheral circulation. Respiratory: Normal respiratory effort.  No retractions. Lungs CTAB. Abdominal: Soft and Really obese minimal tenderness to palpation in epigastric region no right upper quadrant tenderness no right lower quadrant tenderness no left lower quadrant tenderness, no surgical pathology detected. No distention. No guarding no rebound Back:  There is no focal tenderness or step off there is no midline tenderness there are no lesions noted. there is no CVA tenderness  Musculoskeletal: No lower extremity tenderness. No joint effusions, no DVT signs strong distal pulses no edema Neurologic:  Normal speech and language. No gross focal neurologic deficits are appreciated.  Skin:  Skin is warm, dry and intact. No rash noted. Psychiatric: Mood and affect are normal. Speech and behavior are normal.  ____________________________________________   LABS (all labs ordered are listed, but only abnormal results are displayed)  Labs Reviewed  LIPASE, BLOOD - Abnormal; Notable for the following:    Lipase 100 (*)    All other components within normal limits  COMPREHENSIVE METABOLIC PANEL - Abnormal; Notable for  the following:    Sodium 133 (*)    Glucose, Bld 101 (*)    All other components within normal limits  CBC  URINALYSIS COMPLETEWITH MICROSCOPIC ([facility] ONLY)  POC URINE PREG, ED   ____________________________________________  EKG  I personally interpreted any EKGs ordered by me or triage Normal sinus rhythm rate 80 bpm no acute ST elevation or acute ST depression normal axis unremarkable EKG. ____________________________________________  RADIOLOGY  I reviewed any imaging ordered by me or triage that were performed during my shift and, if possible, patient and/or family made aware of any abnormal findings.  ____________________________________________   PROCEDURES  Procedure(s) performed: None  Critical Care performed: None  ____________________________________________   INITIAL IMPRESSION / ASSESSMENT AND PLAN / ED COURSE  Pertinent labs & imaging results that were available during my care of the patient were reviewed by me and considered in my medical decision making (see chart for details).  Patient with reproducible abdominal pain on a chronic basis presents with her reproducible abdominal pain which seems slightly worse over the last day or 2. Do not believe this represents ACS PE or dissection. Patient has no exertional discomfort and again this is a very reproducible abdominal pain. Her lipase is slightly up. This could be the cause. Patient has had this image before with no evidence of pseudocyst or pancreatic mass and I do not think repeat imaging is indicated today. At this time, her goal be pain control. She has no evidence of a transaminase elevation or liver function test abnormality, electrolyte are reassuring, CBC is reassuring including white count.  ----------------------------------------- 8:28 PM on 10/30/2015 -----------------------------------------  Vision had immediate and complete resolution of symptoms after GI cocktail. I will refer her to GI. Patient  has a questionable urinary tract infection, we will send her with antibiotic pending culture. Nontender at this time. FINAL CLINICAL IMPRESSION(S) / ED DIAGNOSES  Final diagnoses:  None      This chart was dictated using voice recognition software.  Despite best efforts to proofread,  errors can occur which can change meaning.     Jeanmarie PlantJames A McShane, MD 10/30/15 1901  Jeanmarie PlantJames A McShane, MD 10/30/15 2029

## 2015-10-30 NOTE — Discharge Instructions (Signed)

## 2015-10-30 NOTE — ED Notes (Signed)
Pt reports upper abd pain that radiates to back that started last night. Denies vomiting or diarrhea c/o nausea. Reports gas and bloating denies fever.

## 2015-11-01 ENCOUNTER — Other Ambulatory Visit: Payer: Self-pay | Admitting: Nurse Practitioner

## 2015-11-01 DIAGNOSIS — K859 Acute pancreatitis without necrosis or infection, unspecified: Secondary | ICD-10-CM

## 2015-11-01 LAB — URINE CULTURE

## 2015-11-09 ENCOUNTER — Emergency Department (HOSPITAL_COMMUNITY)
Admission: EM | Admit: 2015-11-09 | Discharge: 2015-11-09 | Disposition: A | Discharge status: Home / Self Care | Payer: Self-pay | Attending: Emergency Medicine | Admitting: Emergency Medicine

## 2015-11-09 ENCOUNTER — Other Ambulatory Visit (HOSPITAL_COMMUNITY): Payer: Self-pay | Admitting: Nurse Practitioner

## 2015-11-09 ENCOUNTER — Encounter (HOSPITAL_COMMUNITY): Payer: Self-pay

## 2015-11-09 DIAGNOSIS — I10 Essential (primary) hypertension: Secondary | ICD-10-CM | POA: Insufficient documentation

## 2015-11-09 DIAGNOSIS — R140 Abdominal distension (gaseous): Secondary | ICD-10-CM | POA: Insufficient documentation

## 2015-11-09 DIAGNOSIS — R1013 Epigastric pain: Secondary | ICD-10-CM

## 2015-11-09 DIAGNOSIS — F17210 Nicotine dependence, cigarettes, uncomplicated: Secondary | ICD-10-CM | POA: Insufficient documentation

## 2015-11-09 DIAGNOSIS — K219 Gastro-esophageal reflux disease without esophagitis: Secondary | ICD-10-CM | POA: Insufficient documentation

## 2015-11-09 DIAGNOSIS — F319 Bipolar disorder, unspecified: Secondary | ICD-10-CM | POA: Insufficient documentation

## 2015-11-09 DIAGNOSIS — K859 Acute pancreatitis without necrosis or infection, unspecified: Secondary | ICD-10-CM

## 2015-11-09 DIAGNOSIS — R142 Eructation: Secondary | ICD-10-CM | POA: Insufficient documentation

## 2015-11-09 DIAGNOSIS — J45909 Unspecified asthma, uncomplicated: Secondary | ICD-10-CM | POA: Insufficient documentation

## 2015-11-09 DIAGNOSIS — Z79899 Other long term (current) drug therapy: Secondary | ICD-10-CM | POA: Insufficient documentation

## 2015-11-09 DIAGNOSIS — K2970 Gastritis, unspecified, without bleeding: Secondary | ICD-10-CM | POA: Insufficient documentation

## 2015-11-09 LAB — URINE MICROSCOPIC-ADD ON

## 2015-11-09 LAB — COMPREHENSIVE METABOLIC PANEL
ALBUMIN: 4.2 g/dL (ref 3.5–5.0)
ALK PHOS: 89 U/L (ref 38–126)
ALT: 15 U/L (ref 14–54)
AST: 19 U/L (ref 15–41)
Anion gap: 6 (ref 5–15)
BILIRUBIN TOTAL: 0.3 mg/dL (ref 0.3–1.2)
BUN: 15 mg/dL (ref 6–20)
CALCIUM: 9.2 mg/dL (ref 8.9–10.3)
CO2: 25 mmol/L (ref 22–32)
Chloride: 101 mmol/L (ref 101–111)
Creatinine, Ser: 1 mg/dL (ref 0.44–1.00)
GFR calc Af Amer: >60 mL/min (ref >60)
GFR calc non Af Amer: >60 mL/min (ref >60)
GLUCOSE: 115 mg/dL — AB (ref 65–99)
Potassium: 4 mmol/L (ref 3.5–5.1)
Sodium: 132 mmol/L — ABNORMAL LOW (ref 135–145)
TOTAL PROTEIN: 8 g/dL (ref 6.5–8.1)

## 2015-11-09 LAB — CBC
HCT: 37.5 % (ref 36.0–46.0)
Hemoglobin: 12.3 g/dL (ref 12.0–15.0)
MCH: 29.6 pg (ref 26.0–34.0)
MCHC: 32.8 g/dL (ref 30.0–36.0)
MCV: 90.1 fL (ref 78.0–100.0)
Platelets: 300 10*3/uL (ref 150–400)
RBC: 4.16 MIL/uL (ref 3.87–5.11)
RDW: 13.4 % (ref 11.5–15.5)
WBC: 8.7 10*3/uL (ref 4.0–10.5)

## 2015-11-09 LAB — URINALYSIS, ROUTINE W REFLEX MICROSCOPIC
BILIRUBIN URINE: NEGATIVE
Glucose, UA: NEGATIVE mg/dL
KETONES UR: NEGATIVE mg/dL
Leukocytes, UA: NEGATIVE
NITRITE: NEGATIVE
PH: 6 (ref 5.0–8.0)
Protein, ur: NEGATIVE mg/dL
Specific Gravity, Urine: 1.026 (ref 1.005–1.030)

## 2015-11-09 LAB — LIPASE, BLOOD: Lipase: 39 U/L (ref 11–51)

## 2015-11-09 LAB — PREGNANCY, URINE: PREG TEST UR: NEGATIVE

## 2015-11-09 MED ORDER — HYDROCODONE-ACETAMINOPHEN 5-325 MG PO TABS
1.0000 | ORAL_TABLET | Freq: Once | ORAL | Status: AC
  Administered 2015-11-09: 1 via ORAL
  Filled 2015-11-09: qty 1

## 2015-11-09 MED ORDER — GI COCKTAIL ~~LOC~~
30.0000 mL | Freq: Once | ORAL | Status: AC
  Administered 2015-11-09: 30 mL via ORAL
  Filled 2015-11-09: qty 30

## 2015-11-09 MED ORDER — PANTOPRAZOLE SODIUM 40 MG PO TBEC: 40.0000 mg | DELAYED_RELEASE_TABLET | Freq: Every day | ORAL | Status: DC

## 2015-11-09 MED ORDER — PANTOPRAZOLE SODIUM 40 MG PO TBEC
40.0000 mg | DELAYED_RELEASE_TABLET | Freq: Once | ORAL | Status: AC
  Administered 2015-11-09: 40 mg via ORAL
  Filled 2015-11-09: qty 1

## 2015-11-09 MED ORDER — RANITIDINE HCL 150 MG PO TABS: 150.0000 mg | ORAL_TABLET | Freq: Two times a day (BID) | ORAL | Status: DC

## 2015-11-09 MED ORDER — HYDROCODONE-ACETAMINOPHEN 5-325 MG PO TABS: 1.0000 | ORAL_TABLET | Freq: Four times a day (QID) | ORAL | Status: DC | PRN

## 2015-11-09 NOTE — Discharge Instructions (Signed)
Your abdominal pain is likely from gastritis or an ulcer. You will need to take zantac and start protonix as directed, and avoid spicy/fatty/acidic foods, avoid soda/coffee/tea/alcohol. Avoid laying down flat within 30 minutes of eating. Avoid NSAIDs like ibuprofen/aleve/motrin/etc on an empty stomach. May consider using over the counter tums/maalox as needed for additional relief. Use tylenol or norco as needed for pain but don't drive or operate machinery while taking this medication. Follow up with the gastroenterologist/your regular doctor at your scheduled MRCP and next week for ongoing evaluation and management of your abdominal pain. Return to the ER for changes or worsening symptoms.  Abdominal (belly) pain can be caused by many things. Your caregiver performed an examination and possibly ordered blood/urine tests and imaging (CT scan, x-rays, ultrasound). Many cases can be observed and treated at home after initial evaluation in the emergency department. Even though you are being discharged home, abdominal pain can be unpredictable. Therefore, you need a repeated exam if your pain does not resolve, returns, or worsens. Most patients with abdominal pain don't have to be admitted to the hospital or have surgery, but serious problems like appendicitis and gallbladder attacks can start out as nonspecific pain. Many abdominal conditions cannot be diagnosed in one visit, so follow-up evaluations are very important. SEEK IMMEDIATE MEDICAL ATTENTION IF YOU DEVELOP ANY OF THE FOLLOWING SYMPTOMS:  The pain does not go away or becomes severe.   A temperature above 101 develops.   Repeated vomiting occurs (multiple episodes).   The pain becomes localized to portions of the abdomen. The right side could possibly be appendicitis. In an adult, the left lower portion of the abdomen could be colitis or diverticulitis.   Blood is being passed in stools or vomit (bright red or black tarry stools).   Return also  if you develop chest pain, difficulty breathing, dizziness or fainting, or become confused, poorly responsive, or inconsolable (young children).  The constipation stays for more than 4 days.   There is belly (abdominal) or rectal pain.   You do not seem to be getting better.      Abdominal Pain, Adult Many things can cause belly (abdominal) pain. Most times, the belly pain is not dangerous. Many cases of belly pain can be watched and treated at home. HOME CARE   Do not take medicines that help you go poop (laxatives) unless told to by your doctor.  Only take medicine as told by your doctor.  Eat or drink as told by your doctor. Your doctor will tell you if you should be on a special diet. GET HELP IF:  You do not know what is causing your belly pain.  You have belly pain while you are sick to your stomach (nauseous) or have runny poop (diarrhea).  You have pain while you pee or poop.  Your belly pain wakes you up at night.  You have belly pain that gets worse or better when you eat.  You have belly pain that gets worse when you eat fatty foods.  You have a fever. GET HELP RIGHT AWAY IF:   The pain does not go away within 2 hours.  You keep throwing up (vomiting).  The pain changes and is only in the right or left part of the belly.  You have bloody or tarry looking poop. MAKE SURE YOU:   Understand these instructions.  Will watch your condition.  Will get help right away if you are not doing well or get worse.  This information is not intended to replace advice given to you by your health care provider. Make sure you discuss any questions you have with your health care provider.   Document Released: 10/31/2007 Document Revised: 06/04/2014 Document Reviewed: 01/21/2013 Elsevier Interactive Patient Education 2016 Elsevier Inc.  Gastritis, Adult Gastritis is soreness and puffiness (inflammation) of the lining of the stomach. If you do not get help, gastritis can  cause bleeding and sores (ulcers) in the stomach. HOME CARE   Only take medicine as told by your doctor.  If you were given antibiotic medicines, take them as told. Finish the medicines even if you start to feel better.  Drink enough fluids to keep your pee (urine) clear or pale yellow.  Avoid foods and drinks that make your problems worse. Foods you may want to avoid include:  Caffeine or alcohol.  Chocolate.  Mint.  Garlic and onions.  Spicy foods.  Citrus fruits, including oranges, lemons, or limes.  Food containing tomatoes, including sauce, chili, salsa, and pizza.  Fried and fatty foods.  Eat small meals throughout the day instead of large meals. GET HELP RIGHT AWAY IF:   You have black or dark red poop (stools).  You throw up (vomit) blood. It may look like coffee grounds.  You cannot keep fluids down.  Your belly (abdominal) pain gets worse.  You have a fever.  You do not feel better after 1 week.  You have any other questions or concerns. MAKE SURE YOU:   Understand these instructions.  Will watch your condition.  Will get help right away if you are not doing well or get worse.   This information is not intended to replace advice given to you by your health care provider. Make sure you discuss any questions you have with your health care provider.   Document Released: 10/31/2007 Document Revised: 08/06/2011 Document Reviewed: 06/27/2011 Elsevier Interactive Patient Education 2016 [employer]  Food Choices for Gastroesophageal Reflux Disease, Adult When you have gastroesophageal reflux disease (GERD), the foods you eat and your eating habits are very important. Choosing the right foods can help ease the discomfort of GERD. WHAT GENERAL GUIDELINES DO I NEED TO FOLLOW?  Choose fruits, vegetables, whole grains, low-fat dairy products, and low-fat meat, fish, and poultry.  Limit fats such as oils, salad dressings, butter, nuts, and avocado.  Keep  a food diary to identify foods that cause symptoms.  Avoid foods that cause reflux. These may be different for different people.  Eat frequent small meals instead of three large meals each day.  Eat your meals slowly, in a relaxed setting.  Limit fried foods.  Cook foods using methods other than frying.  Avoid drinking alcohol.  Avoid drinking large amounts of liquids with your meals.  Avoid bending over or lying down until 2-3 hours after eating. WHAT FOODS ARE NOT RECOMMENDED? The following are some foods and drinks that may worsen your symptoms: Vegetables Tomatoes. Tomato juice. Tomato and spaghetti sauce. Chili peppers. Onion and garlic. Horseradish. Fruits Oranges, grapefruit, and lemon (fruit and juice). Meats High-fat meats, fish, and poultry. This includes hot dogs, ribs, ham, sausage, salami, and bacon. Dairy Whole milk and chocolate milk. Sour cream. Cream. Butter. Ice cream. Cream cheese.  Beverages Coffee and tea, with or without caffeine. Carbonated beverages or energy drinks. Condiments Hot sauce. Barbecue sauce.  Sweets/Desserts Chocolate and cocoa. Donuts. Peppermint and spearmint. Fats and Oils High-fat foods, including [place] fries and potato chips. Other Vinegar. Strong spices, such as  black pepper, white pepper, red pepper, cayenne, curry powder, cloves, ginger, and chili powder. The items listed above may not be a complete list of foods and beverages to avoid. Contact your dietitian for more information.   This information is not intended to replace advice given to you by your health care provider. Make sure you discuss any questions you have with your health care provider.   Document Released: 05/14/2005 Document Revised: 06/04/2014 Document Reviewed: 03/18/2013 Elsevier Interactive Patient Education 2016 Elsevier Inc.  Gastroesophageal Reflux Disease, Adult Normally, food travels down the esophagus and stays in the stomach to be digested. If a  person has gastroesophageal reflux disease (GERD), food and stomach acid move back up into the esophagus. When this happens, the esophagus becomes sore and swollen (inflamed). Over time, GERD can make small holes (ulcers) in the lining of the esophagus. HOME CARE Diet  Follow a diet as told by your doctor. You may need to avoid foods and drinks such as:  Coffee and tea (with or without caffeine).  Drinks that contain alcohol.  Energy drinks and sports drinks.  Carbonated drinks or sodas.  Chocolate and cocoa.  Peppermint and mint flavorings.  Garlic and onions.  Horseradish.  Spicy and acidic foods, such as peppers, chili powder, curry powder, vinegar, hot sauces, and BBQ sauce.  Citrus fruit juices and citrus fruits, such as oranges, lemons, and limes.  Tomato-based foods, such as red sauce, chili, salsa, and pizza with red sauce.  Fried and fatty foods, such as donuts, french fries, potato chips, and high-fat dressings.  High-fat meats, such as hot dogs, rib eye steak, sausage, ham, and bacon.  High-fat dairy items, such as whole milk, butter, and cream cheese.  Eat small meals often. Avoid eating large meals.  Avoid drinking large amounts of liquid with your meals.  Avoid eating meals during the 2-3 hours before bedtime.  Avoid lying down right after you eat.  Do not exercise right after you eat. General Instructions  Pay attention to any changes in your symptoms.  Take over-the-counter and prescription medicines only as told by your doctor. Do not take aspirin, ibuprofen, or other NSAIDs unless your doctor says it is okay.  Do not use any tobacco products, including cigarettes, chewing tobacco, and e-cigarettes. If you need help quitting, ask your doctor.  Wear loose clothes. Do not wear anything tight around your waist.  Raise (elevate) the head of your bed about 6 inches (15 cm).  Try to lower your stress. If you need help doing this, ask your  doctor.  If you are overweight, lose an amount of weight that is healthy for you. Ask your doctor about a safe weight loss goal.  Keep all follow-up visits as told by your doctor. This is important. GET HELP IF:  You have new symptoms.  You lose weight and you do not know why it is happening.  You have trouble swallowing, or it hurts to swallow.  You have wheezing or a cough that keeps happening.  Your symptoms do not get better with treatment.  You have a hoarse voice. GET HELP RIGHT AWAY IF:  You have pain in your arms, neck, jaw, teeth, or back.  You feel sweaty, dizzy, or light-headed.  You have chest pain or shortness of breath.  You throw up (vomit) and your throw up looks like blood or coffee grounds.  You pass out (faint).  Your poop (stool) is bloody or black.  You cannot swallow, drink, or eat.  This information is not intended to replace advice given to you by your health care provider. Make sure you discuss any questions you have with your health care provider.   Document Released: 10/31/2007 Document Revised: 02/02/2015 Document Reviewed: 09/08/2014 Elsevier Interactive Patient Education [employer]

## 2015-11-09 NOTE — ED Provider Notes (Signed)
CSN: [id number]     Arrival date & time 11/09/15  1605 History   First MD Initiated Contact with Patient 11/09/15 1852     Chief Complaint  Patient presents with  . Abdominal Pain     (Consider location/radiation/quality/duration/timing/severity/associated sxs/prior Treatment) HPI Comments: Kristina Schultz is a 45 y.o. female with a PMHx of HTN, asthma, pancreatitis, and bipolar 1 disorder, who presents to the ED with complaints of one year of waxing and waning constant epigastric abdominal pain. She reports she is scheduled for an MRCP on Saturday 6/17 by Dr. Daryll Drown of the [place] [facility]. She was seen on 10/[facility]/15 at [facility] for similar symptoms, had a negative workup aside from being diagnosed with a UTI, she finished her antibiotic course. She states that during that visit they gave her a GI cocktail which did help, but discharge her home with Zantac and tramadol which have not helped. She describes the pain currently is 9/10 constant sharp soreness in the epigastrium, nonradiating, with no known aggravating factors, unchanged with eating or drinking, somewhat improved with GI cocktail, and unrelieved with Zantac and tramadol. Associated symptoms include belching and bloating. She is frustrated because she doesn't know what is the cause of her symptoms. She reports that her GI specialist wanted to do an EGD as well, but wants to do the MRCP first.  She denies any fevers, chills, chest pain, shortness breath, nausea, vomiting, diarrhea, constipation, melena, hematochezia, obstipation, dysuria, hematuria, vaginal bleeding or discharge, numbness, tingling, or focal weakness. She denies any recent travel, sick contacts, suspicious food intake, alcohol use, NSAID use, or prior abdominal surgeries. LMP started 1 week ago and ended today.  Patient is a 45 y.o. female presenting with abdominal pain. The history is provided by the patient and medical records. No language interpreter was used.  Abdominal  Pain Pain location:  Epigastric Pain quality: sharp (and sore)   Pain radiates to:  Does not radiate Pain severity:  Moderate Onset quality:  Gradual Duration:  52 weeks (1 year) Timing:  Constant Progression:  Waxing and waning Chronicity:  Chronic Context: not previous surgeries, not recent travel, not sick contacts and not suspicious food intake   Relieved by:  Antacids (GI cocktail in the ER 10/30/15) Worsened by:  Nothing tried Ineffective treatments:  Antacids (zantac and tramadol) Associated symptoms: belching   Associated symptoms: no chest pain, no chills, no constipation, no diarrhea, no dysuria, no fever, no flatus, no hematemesis, no hematochezia, no hematuria, no melena, no nausea, no shortness of breath, no vaginal bleeding, no vaginal discharge and no vomiting   Risk factors: obesity   Risk factors: no alcohol abuse, has not had multiple surgeries and no NSAID use     Past Medical History  Diagnosis Date  . Hypertension   . Asthma   . Bipolar 1 disorder [employer])    Past Surgical History  Procedure Laterality Date  . Cyst removal trunk     History reviewed. No pertinent family history. Social History  Substance Use Topics  . Smoking status: Current Every Day Smoker -- 0.25 packs/day    Types: Cigars  . Smokeless tobacco: Never Used  . Alcohol Use: No   OB History    Gravida Para Term Preterm AB TAB SAB Ectopic Multiple Living       Review of Systems  Constitutional: Negative for fever and chills.  Respiratory: Negative for shortness of breath.   Cardiovascular: Negative  for chest pain.  Gastrointestinal: Positive for abdominal pain. Negative for nausea, vomiting, diarrhea, constipation, blood in stool, melena, hematochezia, anal bleeding, flatus and hematemesis.       +belching +bloating  Genitourinary: Negative for dysuria, hematuria, vaginal bleeding and vaginal discharge.  Musculoskeletal: Negative for myalgias and arthralgias.    Skin: Negative for color change.  Allergic/Immunologic: Negative for immunocompromised state.  Neurological: Negative for weakness and numbness.  Psychiatric/Behavioral: Negative for confusion.   10 Systems reviewed and are negative for acute change except as noted in the HPI.    Allergies  Atenolol; Celexa; Penicillins; Prednisone; and Sulfa antibiotics  Home Medications   Prior to Admission medications   Medication Sig Start Date End Date Taking? Authorizing Provider  albuterol (PROVENTIL HFA;VENTOLIN HFA) 108 (90 BASE) MCG/ACT inhaler Inhale 2 puffs into the lungs every 6 (six) hours as needed for wheezing or shortness of breath. 03/06/15   Jene Every, MD  diphenhydrAMINE (BENADRYL) 25 mg capsule Take 1 capsule (25 mg total) by mouth every 4 (four) hours as needed. 01/23/15 01/23/16  Chinita Pester, FNP  HYDROcodone-acetaminophen (NORCO/VICODIN) 5-325 MG per tablet Take 1 tablet by mouth every 6 (six) hours as needed for moderate pain. 02/01/15   Sharyn Creamer, MD  ondansetron (ZOFRAN ODT) 4 MG disintegrating tablet Take 1 tablet (4 mg total) by mouth every 6 (six) hours as needed for nausea or vomiting. 02/01/15   Sharyn Creamer, MD  oxyCODONE-acetaminophen (ROXICET) 5-325 MG tablet Take 1 tablet by mouth every 6 (six) hours as needed for severe pain. 09/03/15   Sharman Cheek, MD  predniSONE (DELTASONE) 50 MG tablet Take 1 tablet (50 mg total) by mouth daily with breakfast. 03/06/15   Jene Every, MD  promethazine (PHENERGAN) 25 MG tablet Take 1 tablet (25 mg total) by mouth every 6 (six) hours as needed for nausea or vomiting. 09/03/15   Sharman Cheek, MD  ranitidine (ZANTAC) 150 MG capsule Take 1 capsule (150 mg total) by mouth 2 (two) times daily. 10/30/15   Jeanmarie Plant, MD  traMADol (ULTRAM) 50 MG tablet Take 1 tablet (50 mg total) by mouth every 6 (six) hours as needed. 10/30/15 10/29/16  Jeanmarie Plant, MD  triamcinolone ointment (KENALOG) 0.5 % Apply 1 application topically 2 (two) times  daily. 01/23/15   Cari B Triplett, FNP   BP 102/74 mmHg  Pulse 87  Temp(Src) 97.8 F (36.6 C) (Oral)  Resp 16  Ht 6' (1.829 m)  Wt 122.471 kg  BMI 36.61 kg/m2  SpO2 98%  LMP 11/08/2015 Physical Exam  Constitutional: She is oriented to person, place, and time. Vital signs are normal. She appears well-developed and well-nourished.  Non-toxic appearance. No distress.  Afebrile, nontoxic, NAD  HENT:  Head: Normocephalic and atraumatic.  Mouth/Throat: Oropharynx is clear and moist and mucous membranes are normal.  Eyes: Conjunctivae and EOM are normal. Right eye exhibits no discharge. Left eye exhibits no discharge.  Neck: Normal range of motion. Neck supple.  Cardiovascular: Normal rate, regular rhythm, normal heart sounds and intact distal pulses.  Exam reveals no gallop and no friction rub.   No murmur heard. Pulmonary/Chest: Effort normal and breath sounds normal. No respiratory distress. She has no decreased breath sounds. She has no wheezes. She has no rhonchi. She has no rales.  Abdominal: Soft. Normal appearance and bowel sounds are normal. She exhibits no distension. There is tenderness in the epigastric area. There is no rigidity, no rebound, no guarding, no CVA tenderness, no tenderness at  McBurney's point and negative Murphy's sign. No hernia.    Soft, obese but nondistended, +BS throughout, with moderate epigastric TTP, no r/g/r, neg murphy's, neg mcburney's, no CVA TTP, no abdominal wall hernias noted with valsalva  Musculoskeletal: Normal range of motion.  Neurological: She is alert and oriented to person, place, and time. She has normal strength. No sensory deficit.  Skin: Skin is warm, dry and intact. No rash noted.  Psychiatric: She has a normal mood and affect.  Nursing note and vitals reviewed.   ED Course  Procedures (including critical care time) Labs Review Labs Reviewed  COMPREHENSIVE METABOLIC PANEL - Abnormal; Notable for the following:    Sodium 132 (*)     Glucose, Bld 115 (*)    All other components within normal limits  URINALYSIS, ROUTINE W REFLEX MICROSCOPIC (NOT AT [facility] - Abnormal; Notable for the following:    APPearance CLOUDY (*)    Hgb urine dipstick MODERATE (*)    All other components within normal limits  URINE MICROSCOPIC-ADD ON - Abnormal; Notable for the following:    Squamous Epithelial / LPF 6-30 (*)    Bacteria, UA FEW (*)    All other components within normal limits  LIPASE, BLOOD  CBC  PREGNANCY, URINE    Imaging Review No results found.   RUQ U/S 01/2015 Study Result     CLINICAL DATA: Acute epigastric abdominal pain.  EXAM: US ABDOMEN LIMITED - RIGHT UPPER QUADRANT  COMPARISON: CT scan of January 30, 2013.  FINDINGS: Gallbladder:  No gallstones or wall thickening visualized. No sonographic Murphy sign noted.  Common bile duct:  Diameter: 4 mm which is within normal limits.  Liver:  No focal lesion identified. Within normal limits in parenchymal echogenicity.  IMPRESSION: No abnormality seen in the right upper quadrant.   Electronically Signed  By: Lupita Raider, M.D.  On: 02/01/2015 18:28   CT Abd/Pelv w/o contrast 08/2015 Study Result     CLINICAL DATA: Pancreatitis.  EXAM: CT ABDOMEN AND PELVIS WITHOUT CONTRAST  TECHNIQUE: Multidetector CT imaging of the abdomen and pelvis was performed following the standard protocol without IV contrast.  COMPARISON: 01/30/2013  FINDINGS: Lower chest: No acute findings.  Hepatobiliary: No mass visualized on this un-enhanced exam.  Pancreas: There is mild edema involving the pancreas. No focal fluid collections identified.  Spleen: Within normal limits in size.  Adrenals/Urinary Tract: No evidence of urolithiasis or hydronephrosis. No definite mass visualized on this un-enhanced exam.  Stomach/Bowel: The stomach is within normal limits. The small bowel loops have a normal course and caliber. No  obstruction. The appendix is visualized and appears normal. Distal colonic diverticula noted without acute inflammation.  Vascular/Lymphatic: No pathologically enlarged lymph nodes. No evidence of abdominal aortic aneurysm.  Reproductive: No mass or other significant abnormality.  Other: There is a supraumbilical ventral abdominal wall hernia which contains fat only, image 33 of series 2.  Musculoskeletal: No suspicious bone lesions identified.  IMPRESSION: 1. Changes of acute pancreatitis without complications.   Electronically Signed  By: Signa Kell M.D.  On: 09/04/2015 14:47    I have personally reviewed and evaluated these images and lab results as part of my medical decision-making.   EKG Interpretation None      MDM   Final diagnoses:  Gastritis  Epigastric abdominal pain  Belching  Abdominal bloating  Gastroesophageal reflux disease, esophagitis presence not specified    45 y.o. female here with ongoing epigastric abd pain x1 yr, belching and bloating as well.  On exam, tenderness in the epigastrum, no RUQ/LUQ tenderness, neg murphy's exam, nonperitoneal exam. Scheduled for MRCP on Saturday. Has already been seen in ER on 10/30/15 for similar complaints, neg labs that day, U/A showed UTI for which she was treated, sent home with tramadol and zantac which she states hasn't helped. U/A today contaminated but no evidence of UTI. Upreg neg. Lipase WNL, CMP unremarkable, CBC unremarkable. Given exam, doubt need for further imaging or labs, likely due to gastritis vs PUD. GI cocktail at last ER visit helped temporarily, which points to these as a cause. Will repeat this, start on protonix, and give vicodin for pain relief, then likely d/c home with rx for these. NCDB reviewed, only 2 controlled substance rx's in the last 6 months, one for 4 tabs tramadol on 10/31/15 and another for 12 tabs percocet on 09/03/15. Will reassess after meds given  8:07 PM Pt feeling  somewhat better. Will d/c home with vicodin, zantac, and protonix, f/up with her PCP/GI specialist on Saturday for ongoing management of her chronic abd pain. Likely PUD/GERD/gastritis. I explained the diagnosis and have given explicit precautions to return to the ER including for any other new or worsening symptoms. The patient understands and accepts the medical plan as it's been dictated and I have answered their questions. Discharge instructions concerning home care and prescriptions have been given. The patient is STABLE and is discharged to home in good condition.  BP 123/90 mmHg  Pulse 82  Temp(Src) 98.2 F (36.8 C) (Oral)  Resp 16  Ht 6' (1.829 m)  Wt 122.471 kg  BMI 36.61 kg/m2  SpO2 100%  LMP 11/08/2015  Meds ordered this encounter  Medications  . gi cocktail (Maalox,Lidocaine,Donnatal)    Sig:   . pantoprazole (PROTONIX) EC tablet 40 mg    Sig:   . HYDROcodone-acetaminophen (NORCO/VICODIN) 5-325 MG per tablet 1 tablet    Sig:   . HYDROcodone-acetaminophen (NORCO) 5-325 MG tablet    Sig: Take 1 tablet by mouth every 6 (six) hours as needed for severe pain.    Dispense:  10 tablet    Refill:  0    Order Specific Question:  Supervising Provider    Answer:  Hyacinth MeekerMILLER, BRIAN [[id number]]  . pantoprazole (PROTONIX) 40 MG tablet    Sig: Take 1 tablet (40 mg total) by mouth daily.    Dispense:  30 tablet    Refill:  0    Order Specific Question:  Supervising Provider    Answer:  MILLER, BRIAN [[id number]]  . ranitidine (ZANTAC) 150 MG tablet    Sig: Take 1 tablet (150 mg total) by mouth 2 (two) times daily.    Dispense:  30 tablet    Refill:  0    Order Specific Question:  Supervising Provider    Answer:  Eber HongMILLER, BRIAN [[id number]]       Camari Wisham Camprubi-Soms, PA-C 11/09/15 2008  Cathren LaineKevin Steinl, MD 11/10/15 1527

## 2015-11-09 NOTE — ED Notes (Signed)
Bed: WTR7 Expected date:  Expected time:  Means of arrival:  Comments: Lab 

## 2015-11-09 NOTE — ED Notes (Signed)
Patient states she has had intermittent mid abdominal pain. Patient states she has been seen  several times for the same.  Patient denies N/V/D, but does c/o acid reflux.

## 2015-11-12 ENCOUNTER — Ambulatory Visit (HOSPITAL_COMMUNITY)
Admission: RE | Admit: 2015-11-12 | Discharge: 2015-11-12 | Disposition: A | Discharge status: Home / Self Care | Payer: Self-pay | Source: Ambulatory Visit | Attending: Nurse Practitioner | Admitting: Nurse Practitioner

## 2015-11-12 ENCOUNTER — Other Ambulatory Visit (HOSPITAL_COMMUNITY): Payer: Self-pay | Admitting: Nurse Practitioner

## 2015-11-12 DIAGNOSIS — K760 Fatty (change of) liver, not elsewhere classified: Secondary | ICD-10-CM | POA: Insufficient documentation

## 2015-11-12 DIAGNOSIS — K859 Acute pancreatitis without necrosis or infection, unspecified: Secondary | ICD-10-CM

## 2015-11-12 DIAGNOSIS — K439 Ventral hernia without obstruction or gangrene: Secondary | ICD-10-CM | POA: Insufficient documentation

## 2015-11-12 MED ORDER — GADOBENATE DIMEGLUMINE 529 MG/ML IV SOLN
20.0000 mL | Freq: Once | INTRAVENOUS | Status: AC | PRN
  Administered 2015-11-12: 20 mL via INTRAVENOUS

## 2015-11-15 ENCOUNTER — Other Ambulatory Visit: Payer: Self-pay | Admitting: Nurse Practitioner

## 2015-11-15 DIAGNOSIS — IMO0002 Reserved for concepts with insufficient information to code with codable children: Secondary | ICD-10-CM

## 2015-11-15 DIAGNOSIS — K859 Acute pancreatitis without necrosis or infection, unspecified: Secondary | ICD-10-CM

## 2015-11-17 ENCOUNTER — Ambulatory Visit
Admission: RE | Admit: 2015-11-17 | Discharge: 2015-11-17 | Disposition: A | Discharge status: Home / Self Care | Payer: Self-pay | Source: Ambulatory Visit | Attending: Nurse Practitioner | Admitting: Nurse Practitioner

## 2015-11-17 ENCOUNTER — Ambulatory Visit: Payer: Self-pay

## 2015-11-17 DIAGNOSIS — K861 Other chronic pancreatitis: Secondary | ICD-10-CM | POA: Insufficient documentation

## 2015-11-17 DIAGNOSIS — R932 Abnormal findings on diagnostic imaging of liver and biliary tract: Secondary | ICD-10-CM | POA: Insufficient documentation

## 2015-11-17 DIAGNOSIS — K859 Acute pancreatitis without necrosis or infection, unspecified: Secondary | ICD-10-CM

## 2015-11-21 ENCOUNTER — Other Ambulatory Visit: Payer: Self-pay

## 2016-02-14 ENCOUNTER — Ambulatory Visit: Admission: RE | Admit: 2016-02-14 | Payer: Self-pay | Source: Ambulatory Visit | Admitting: Gastroenterology

## 2016-02-14 ENCOUNTER — Encounter: Admission: RE | Payer: Self-pay | Source: Ambulatory Visit

## 2016-02-14 SURGERY — ESOPHAGOGASTRODUODENOSCOPY (EGD) WITH PROPOFOL
Anesthesia: General

## 2016-02-18 ENCOUNTER — Encounter: Payer: Self-pay | Admitting: Urgent Care

## 2016-02-18 ENCOUNTER — Emergency Department: Payer: Self-pay

## 2016-02-18 ENCOUNTER — Observation Stay
Admission: EM | Admit: 2016-02-18 | Discharge: 2016-02-19 | Disposition: A | Discharge status: Home / Self Care | Payer: Self-pay | Attending: General Surgery | Admitting: General Surgery

## 2016-02-18 DIAGNOSIS — K5730 Diverticulosis of large intestine without perforation or abscess without bleeding: Secondary | ICD-10-CM | POA: Insufficient documentation

## 2016-02-18 DIAGNOSIS — K760 Fatty (change of) liver, not elsewhere classified: Secondary | ICD-10-CM | POA: Insufficient documentation

## 2016-02-18 DIAGNOSIS — I10 Essential (primary) hypertension: Secondary | ICD-10-CM | POA: Insufficient documentation

## 2016-02-18 DIAGNOSIS — E785 Hyperlipidemia, unspecified: Secondary | ICD-10-CM | POA: Insufficient documentation

## 2016-02-18 DIAGNOSIS — Z882 Allergy status to sulfonamides status: Secondary | ICD-10-CM | POA: Insufficient documentation

## 2016-02-18 DIAGNOSIS — Z79899 Other long term (current) drug therapy: Secondary | ICD-10-CM | POA: Insufficient documentation

## 2016-02-18 DIAGNOSIS — F17210 Nicotine dependence, cigarettes, uncomplicated: Secondary | ICD-10-CM | POA: Insufficient documentation

## 2016-02-18 DIAGNOSIS — D72829 Elevated white blood cell count, unspecified: Secondary | ICD-10-CM | POA: Insufficient documentation

## 2016-02-18 DIAGNOSIS — K436 Other and unspecified ventral hernia with obstruction, without gangrene: Principal | ICD-10-CM | POA: Diagnosis present

## 2016-02-18 DIAGNOSIS — Z880 Allergy status to penicillin: Secondary | ICD-10-CM | POA: Insufficient documentation

## 2016-02-18 DIAGNOSIS — F319 Bipolar disorder, unspecified: Secondary | ICD-10-CM | POA: Insufficient documentation

## 2016-02-18 DIAGNOSIS — N854 Malposition of uterus: Secondary | ICD-10-CM | POA: Insufficient documentation

## 2016-02-18 DIAGNOSIS — Z87892 Personal history of anaphylaxis: Secondary | ICD-10-CM | POA: Insufficient documentation

## 2016-02-18 DIAGNOSIS — Z888 Allergy status to other drugs, medicaments and biological substances status: Secondary | ICD-10-CM | POA: Insufficient documentation

## 2016-02-18 DIAGNOSIS — K430 Incisional hernia with obstruction, without gangrene: Secondary | ICD-10-CM

## 2016-02-18 DIAGNOSIS — I700 Atherosclerosis of aorta: Secondary | ICD-10-CM | POA: Insufficient documentation

## 2016-02-18 DIAGNOSIS — J45909 Unspecified asthma, uncomplicated: Secondary | ICD-10-CM | POA: Insufficient documentation

## 2016-02-18 DIAGNOSIS — G40909 Epilepsy, unspecified, not intractable, without status epilepticus: Secondary | ICD-10-CM | POA: Insufficient documentation

## 2016-02-18 HISTORY — DX: Hyperlipidemia, unspecified: E78.5

## 2016-02-18 HISTORY — DX: Fatty (change of) liver, not elsewhere classified: K76.0

## 2016-02-18 HISTORY — DX: Unspecified convulsions: R56.9

## 2016-02-18 LAB — URINALYSIS COMPLETE WITH MICROSCOPIC (ARMC ONLY)
BACTERIA UA: NONE SEEN
BILIRUBIN URINE: NEGATIVE
GLUCOSE, UA: NEGATIVE mg/dL
Ketones, ur: NEGATIVE mg/dL
LEUKOCYTES UA: NEGATIVE
Nitrite: NEGATIVE
Protein, ur: 30 mg/dL — AB
Specific Gravity, Urine: 1.021 (ref 1.005–1.030)
pH: 5 (ref 5.0–8.0)

## 2016-02-18 LAB — CBC
HEMATOCRIT: 36.7 % (ref 35.0–47.0)
Hemoglobin: 12.4 g/dL (ref 12.0–16.0)
MCH: 29.5 pg (ref 26.0–34.0)
MCHC: 33.8 g/dL (ref 32.0–36.0)
MCV: 87.4 fL (ref 80.0–100.0)
PLATELETS: 225 10*3/uL (ref 150–440)
RBC: 4.19 MIL/uL (ref 3.80–5.20)
RDW: 13.9 % (ref 11.5–14.5)
WBC: 14.2 10*3/uL — AB (ref 3.6–11.0)

## 2016-02-18 LAB — COMPREHENSIVE METABOLIC PANEL
ALBUMIN: 3.9 g/dL (ref 3.5–5.0)
ALT: 14 U/L (ref 14–54)
AST: 23 U/L (ref 15–41)
Alkaline Phosphatase: 76 U/L (ref 38–126)
Anion gap: 8 (ref 5–15)
BILIRUBIN TOTAL: 0.5 mg/dL (ref 0.3–1.2)
BUN: 16 mg/dL (ref 6–20)
CHLORIDE: 99 mmol/L — AB (ref 101–111)
CO2: 26 mmol/L (ref 22–32)
CREATININE: 1.06 mg/dL — AB (ref 0.44–1.00)
Calcium: 9.3 mg/dL (ref 8.9–10.3)
GFR calc Af Amer: >60 mL/min (ref >60)
GLUCOSE: 128 mg/dL — AB (ref 65–99)
POTASSIUM: 3.8 mmol/L (ref 3.5–5.1)
Sodium: 133 mmol/L — ABNORMAL LOW (ref 135–145)
TOTAL PROTEIN: 7.9 g/dL (ref 6.5–8.1)

## 2016-02-18 LAB — LIPASE, BLOOD: Lipase: 20 U/L (ref 11–51)

## 2016-02-18 LAB — POC URINE PREG, ED: PREG TEST UR: NEGATIVE

## 2016-02-18 MED ORDER — IOPAMIDOL (ISOVUE-300) INJECTION 61%
30.0000 mL | Freq: Once | INTRAVENOUS | Status: AC | PRN
  Administered 2016-02-18: 30 mL via ORAL

## 2016-02-18 MED ORDER — IOPAMIDOL (ISOVUE-300) INJECTION 61%
125.0000 mL | Freq: Once | INTRAVENOUS | Status: AC | PRN
  Administered 2016-02-18: 125 mL via INTRAVENOUS

## 2016-02-18 NOTE — ED Triage Notes (Signed)
Patient presents with c/o epigastric pain with (+) radiation into LEFT flank for over a month. Patient with a known hernia; reports that she knows that it is getting worse. Patient also with three recent seizures; last was on Wednesday of this week; does not take medications.

## 2016-02-18 NOTE — ED Provider Notes (Signed)
[facility] Emergency Department Provider Note    ____________________________________________   I have reviewed the triage vital signs and the nursing notes.   HISTORY  Chief Complaint Abdominal Pain and Seizures   History limited by: Not Limited   HPI Kristina Schultz is a 45 y.o. female who presents to the emergency department today because of concerns for abdominal pain. She states that the pain is located primarily in the center of her stomach. It has been present for at least one month. It was More severe recently. She does feel like she has had an associated bulge in the center of her stomach. She is concerned that this is hernia. She has had decreased appetite with this. She has had some vomiting. She denies any bloody stool. She denies any fevers.   Past Medical History:  Diagnosis Date  . Asthma   . Bipolar 1 disorder (HCC)   . Fatty liver   . Hyperlipemia   . Hypertension   . Seizures (HCC)     There are no active problems to display for this patient.   Past Surgical History:  Procedure Laterality Date  . CYST REMOVAL TRUNK      Prior to Admission medications   Medication Sig Start Date End Date Taking? Authorizing Provider  albuterol (PROVENTIL HFA;VENTOLIN HFA) 108 (90 BASE) MCG/ACT inhaler Inhale 2 puffs into the lungs every 6 (six) hours as needed for wheezing or shortness of breath. 03/06/15   Jene Every, MD  diphenhydrAMINE (BENADRYL) 25 mg capsule Take 1 capsule (25 mg total) by mouth every 4 (four) hours as needed. Patient not taking: Reported on 11/09/2015 01/23/15 01/23/16  Chinita Pester, FNP  HYDROcodone-acetaminophen (NORCO) 5-325 MG tablet Take 1 tablet by mouth every 6 (six) hours as needed for severe pain. 11/09/15   Mercedes Camprubi-Soms, PA-C  HYDROcodone-acetaminophen (NORCO/VICODIN) 5-325 MG per tablet Take 1 tablet by mouth every 6 (six) hours as needed for moderate pain. 02/01/15   Sharyn Creamer, MD  ondansetron (ZOFRAN  ODT) 4 MG disintegrating tablet Take 1 tablet (4 mg total) by mouth every 6 (six) hours as needed for nausea or vomiting. 02/01/15   Sharyn Creamer, MD  oxyCODONE-acetaminophen (ROXICET) 5-325 MG tablet Take 1 tablet by mouth every 6 (six) hours as needed for severe pain. 09/03/15   Sharman Cheek, MD  pantoprazole (PROTONIX) 40 MG tablet Take 1 tablet (40 mg total) by mouth daily. 11/09/15   Mercedes Camprubi-Soms, PA-C  predniSONE (DELTASONE) 50 MG tablet Take 1 tablet (50 mg total) by mouth daily with breakfast. Patient not taking: Reported on 11/09/2015 03/06/15   Jene Every, MD  promethazine (PHENERGAN) 25 MG tablet Take 1 tablet (25 mg total) by mouth every 6 (six) hours as needed for nausea or vomiting. 09/03/15   Sharman Cheek, MD  ranitidine (ZANTAC) 150 MG capsule Take 1 capsule (150 mg total) by mouth 2 (two) times daily. 10/30/15   Jeanmarie Plant, MD  ranitidine (ZANTAC) 150 MG tablet Take 1 tablet (150 mg total) by mouth 2 (two) times daily. 11/09/15   Mercedes Camprubi-Soms, PA-C  sertraline (ZOLOFT) 50 MG tablet Take 50 mg by mouth at bedtime.    Historical Provider, MD  traMADol (ULTRAM) 50 MG tablet Take 1 tablet (50 mg total) by mouth every 6 (six) hours as needed. 10/30/15 10/29/16  Jeanmarie Plant, MD  triamcinolone ointment (KENALOG) 0.5 % Apply 1 application topically 2 (two) times daily. Patient taking differently: Apply 1 application topically 2 (two) times  daily as needed (skin protection).  01/23/15   Chinita Pester, FNP    Allergies Atenolol; Celexa [citalopram hydrobromide]; Lisinopril; Penicillins; Prednisone; and Sulfa antibiotics  No family history on file.  Social History Social History  Substance Use Topics  . Smoking status: Current Every Day Smoker    Packs/day: 0.25    Types: Cigars  . Smokeless tobacco: Never Used  . Alcohol use No    Review of Systems  Constitutional: Negative for fever. Cardiovascular: Negative for chest pain. Respiratory: Negative for  shortness of breath. Gastrointestinal: Positive for abdominal pain, nausea and vomiting. Genitourinary: Negative for dysuria. Musculoskeletal: Negative for back pain. Skin: Negative for rash. Neurological: Negative for headaches, focal weakness or numbness.   10-point ROS otherwise negative.  ____________________________________________   PHYSICAL EXAM:  VITAL SIGNS: ED Triage Vitals  Enc Vitals Group     BP 02/18/16 2126 136/88     Pulse Rate 02/18/16 2126 93     Resp 02/18/16 2126 18     Temp 02/18/16 2126 98.3 F (36.8 C)     Temp Source 02/18/16 2126 Oral     SpO2 02/18/16 2126 99 %     Weight 02/18/16 2128 270 lb (122.5 kg)     Height 02/18/16 2128 6' (1.829 m)     Head Circumference --      Peak Flow --      Pain Score 02/18/16 2128 9   Constitutional: Alert and oriented. Well appearing and in no distress. Eyes: Conjunctivae are normal. Normal extraocular movements. ENT   Head: Normocephalic and atraumatic.   Nose: No congestion/rhinnorhea.   Mouth/Throat: Mucous membranes are moist.   Neck: No stridor. Hematological/Lymphatic/Immunilogical: No cervical lymphadenopathy. Cardiovascular: Normal rate, regular rhythm.  No murmurs, rubs, or gallops. Respiratory: Normal respiratory effort without tachypnea nor retractions. Breath sounds are clear and equal bilaterally. No wheezes/rales/rhonchi. Gastrointestinal: Soft. Tender to palpation with an area just superior to the umbilicus that is more tender, slightly firm. No skin changes appreciated.  Genitourinary: Deferred Musculoskeletal: Normal range of motion in all extremities. No lower extremity edema. Neurologic:  Normal speech and language. No gross focal neurologic deficits are appreciated.  Skin:  Skin is warm, dry and intact. No rash noted. Psychiatric: Mood and affect are normal. Speech and behavior are normal. Patient exhibits appropriate insight and  judgment.  ____________________________________________    LABS (pertinent positives/negatives)  Labs Reviewed  COMPREHENSIVE METABOLIC PANEL - Abnormal; Notable for the following:       Result Value   Sodium 133 (*)    Chloride 99 (*)    Glucose, Bld 128 (*)    Creatinine, Ser 1.06 (*)    All other components within normal limits  CBC - Abnormal; Notable for the following:    WBC 14.2 (*)    All other components within normal limits  URINALYSIS COMPLETEWITH MICROSCOPIC ([facility] ONLY) - Abnormal; Notable for the following:    Color, Urine YELLOW (*)    APPearance CLOUDY (*)    Hgb urine dipstick 2+ (*)    Protein, ur 30 (*)    Squamous Epithelial / LPF TOO NUMEROUS TO COUNT (*)    All other components within normal limits  LIPASE, BLOOD  POC URINE PREG, ED     ____________________________________________   EKG  None  ____________________________________________    RADIOLOGY  CT abd/pel pending at time of sign out  ___________________________________________   PROCEDURES  Procedures  ____________________________________________   INITIAL IMPRESSION / ASSESSMENT AND PLAN / ED  COURSE  Pertinent labs & imaging results that were available during my care of the patient were reviewed by me and considered in my medical decision making (see chart for details).  Patient presents to the emergency department today because of concerns for abdominal pain. Will plan on getting blood work and CT scan.  ____________________________________________   FINAL CLINICAL IMPRESSION(S) / ED DIAGNOSES  Abdominal pain  Note: This dictation was prepared with Dragon dictation. Any transcriptional errors that result from this process are unintentional    Phineas SemenGraydon Melisse Caetano, MD 02/19/16 1524

## 2016-02-18 NOTE — ED Notes (Signed)
Pt complains of seizures that started in January 2017, second one was November 16, 2015, and third one was February 15, 2016; no seizures prior to these 3.  Pt claims total memory loss after each episode.

## 2016-02-19 ENCOUNTER — Emergency Department: Payer: Self-pay | Admitting: Certified Registered Nurse Anesthetist

## 2016-02-19 ENCOUNTER — Encounter
Admission: EM | Disposition: A | Discharge status: Home / Self Care | Payer: Self-pay | Source: Home / Self Care | Attending: Emergency Medicine

## 2016-02-19 ENCOUNTER — Encounter: Payer: Self-pay | Admitting: Internal Medicine

## 2016-02-19 DIAGNOSIS — K460 Unspecified abdominal hernia with obstruction, without gangrene: Secondary | ICD-10-CM

## 2016-02-19 DIAGNOSIS — K430 Incisional hernia with obstruction, without gangrene: Secondary | ICD-10-CM

## 2016-02-19 DIAGNOSIS — K436 Other and unspecified ventral hernia with obstruction, without gangrene: Secondary | ICD-10-CM | POA: Diagnosis present

## 2016-02-19 HISTORY — PX: VENTRAL HERNIA REPAIR: SHX424

## 2016-02-19 SURGERY — REPAIR, HERNIA, VENTRAL
Anesthesia: General | Wound class: Clean

## 2016-02-19 MED ORDER — CLINDAMYCIN PHOSPHATE 600 MG/50ML IV SOLN
INTRAVENOUS | Status: DC | PRN
  Administered 2016-02-19: 600 mg via INTRAVENOUS

## 2016-02-19 MED ORDER — ACETAMINOPHEN 10 MG/ML IV SOLN
INTRAVENOUS | Status: DC | PRN
  Administered 2016-02-19: 1000 mg via INTRAVENOUS

## 2016-02-19 MED ORDER — PROPOFOL 10 MG/ML IV BOLUS
INTRAVENOUS | Status: DC | PRN
  Administered 2016-02-19: 200 mg via INTRAVENOUS

## 2016-02-19 MED ORDER — ACETAMINOPHEN 325 MG PO TABS: 650.0000 mg | ORAL_TABLET | Freq: Four times a day (QID) | ORAL | 0 refills | Status: DC | PRN

## 2016-02-19 MED ORDER — OXYCODONE HCL 5 MG PO TABS
5.0000 mg | ORAL_TABLET | ORAL | Status: DC | PRN
  Administered 2016-02-19: 5 mg via ORAL
  Filled 2016-02-19: qty 1

## 2016-02-19 MED ORDER — OXYCODONE HCL 5 MG PO TABS: 5.0000 mg | ORAL_TABLET | ORAL | 0 refills | Status: DC | PRN

## 2016-02-19 MED ORDER — PROMETHAZINE HCL 25 MG/ML IJ SOLN: 6.2500 mg | INTRAMUSCULAR | Status: DC | PRN

## 2016-02-19 MED ORDER — MIDAZOLAM HCL 2 MG/2ML IJ SOLN
INTRAMUSCULAR | Status: DC | PRN
  Administered 2016-02-19: 2 mg via INTRAVENOUS

## 2016-02-19 MED ORDER — DEXAMETHASONE SODIUM PHOSPHATE 10 MG/ML IJ SOLN
INTRAMUSCULAR | Status: DC | PRN
  Administered 2016-02-19: 5 mg via INTRAVENOUS

## 2016-02-19 MED ORDER — SUGAMMADEX SODIUM 200 MG/2ML IV SOLN
INTRAVENOUS | Status: DC | PRN
  Administered 2016-02-19: 200 mg via INTRAVENOUS

## 2016-02-19 MED ORDER — LEVETIRACETAM 750 MG PO TABS
750.0000 mg | ORAL_TABLET | Freq: Two times a day (BID) | ORAL | Status: DC
  Administered 2016-02-19: 750 mg via ORAL
  Filled 2016-02-19 (×2): qty 1

## 2016-02-19 MED ORDER — FENTANYL CITRATE (PF) 100 MCG/2ML IJ SOLN
INTRAMUSCULAR | Status: DC | PRN
  Administered 2016-02-19: 50 ug via INTRAVENOUS
  Administered 2016-02-19: 100 ug via INTRAVENOUS

## 2016-02-19 MED ORDER — MORPHINE SULFATE (PF) 2 MG/ML IV SOLN: 2.0000 mg | INTRAVENOUS | Status: DC | PRN

## 2016-02-19 MED ORDER — SUCCINYLCHOLINE CHLORIDE 20 MG/ML IJ SOLN
INTRAMUSCULAR | Status: DC | PRN
  Administered 2016-02-19: 100 mg via INTRAVENOUS

## 2016-02-19 MED ORDER — LIDOCAINE HCL (CARDIAC) 20 MG/ML IV SOLN
INTRAVENOUS | Status: DC | PRN
  Administered 2016-02-19: 30 mg via INTRAVENOUS

## 2016-02-19 MED ORDER — FENTANYL CITRATE (PF) 100 MCG/2ML IJ SOLN: 25.0000 ug | INTRAMUSCULAR | Status: DC | PRN

## 2016-02-19 MED ORDER — LACTATED RINGERS IV SOLN
INTRAVENOUS | Status: DC | PRN
  Administered 2016-02-19: 03:00:00 via INTRAVENOUS

## 2016-02-19 MED ORDER — LEVETIRACETAM 750 MG PO TABS: 750.0000 mg | ORAL_TABLET | Freq: Two times a day (BID) | ORAL | 0 refills | Status: DC

## 2016-02-19 MED ORDER — BUPIVACAINE HCL (PF) 0.5 % IJ SOLN
INTRAMUSCULAR | Status: DC | PRN
  Administered 2016-02-19: 30 mL

## 2016-02-19 MED ORDER — DEXTROSE-NACL 5-0.45 % IV SOLN
INTRAVENOUS | Status: DC
  Administered 2016-02-19: 06:00:00 via INTRAVENOUS

## 2016-02-19 MED ORDER — ACETAMINOPHEN 325 MG PO TABS: 650.0000 mg | ORAL_TABLET | Freq: Four times a day (QID) | ORAL | Status: DC | PRN

## 2016-02-19 MED ORDER — ROCURONIUM BROMIDE 100 MG/10ML IV SOLN
INTRAVENOUS | Status: DC | PRN
  Administered 2016-02-19: 30 mg via INTRAVENOUS

## 2016-02-19 MED ORDER — CHLORHEXIDINE GLUCONATE CLOTH 2 % EX PADS
6.0000 | MEDICATED_PAD | Freq: Once | CUTANEOUS | Status: DC
  Filled 2016-02-19: qty 6

## 2016-02-19 MED ORDER — ONDANSETRON HCL 4 MG/2ML IJ SOLN
INTRAMUSCULAR | Status: DC | PRN
  Administered 2016-02-19: 4 mg via INTRAVENOUS

## 2016-02-19 MED ORDER — ACETAMINOPHEN 650 MG RE SUPP: 650.0000 mg | Freq: Four times a day (QID) | RECTAL | Status: DC | PRN

## 2016-02-19 SURGICAL SUPPLY — 36 items
BENZOIN TINCTURE PRP APPL 2/3 (GAUZE/BANDAGES/DRESSINGS) ×2 IMPLANT
BLADE SURG 15 STRL SS SAFETY (BLADE) ×2 IMPLANT
CANISTER SUCT 1200ML W/VALVE (MISCELLANEOUS) ×2 IMPLANT
CHLORAPREP W/TINT 26ML (MISCELLANEOUS) ×2 IMPLANT
DECANTER SPIKE VIAL GLASS SM (MISCELLANEOUS) IMPLANT
DRAPE LAPAROTOMY 100X77 ABD (DRAPES) ×2 IMPLANT
DRESSING TELFA 4X3 1S ST N-ADH (GAUZE/BANDAGES/DRESSINGS) IMPLANT
DRSG TEGADERM 4X4.75 (GAUZE/BANDAGES/DRESSINGS) ×2 IMPLANT
DRSG TELFA 3X8 NADH (GAUZE/BANDAGES/DRESSINGS) ×2 IMPLANT
ELECT REM PT RETURN 9FT ADLT (ELECTROSURGICAL) ×2
ELECTRODE REM PT RTRN 9FT ADLT (ELECTROSURGICAL) ×1 IMPLANT
GLOVE BIO SURGEON STRL SZ7 (GLOVE) ×6 IMPLANT
GOWN STRL REUS W/ TWL LRG LVL3 (GOWN DISPOSABLE) ×2 IMPLANT
GOWN STRL REUS W/TWL LRG LVL3 (GOWN DISPOSABLE) ×2
KIT RM TURNOVER STRD PROC AR (KITS) ×2 IMPLANT
LABEL OR SOLS (LABEL) IMPLANT
NDL HPO THNWL 1X22GA REG BVL (NEEDLE) ×1 IMPLANT
NEEDLE HYPO 25X1 1.5 SAFETY (NEEDLE) ×4 IMPLANT
NEEDLE SAFETY 22GX1 (NEEDLE) ×1
NS IRRIG 500ML POUR BTL (IV SOLUTION) ×2 IMPLANT
PACK BASIN MINOR ARMC (MISCELLANEOUS) ×2 IMPLANT
SPONGE LAP 18X18 5 PK (GAUZE/BANDAGES/DRESSINGS) ×2 IMPLANT
STRIP CLOSURE SKIN 1/2X4 (GAUZE/BANDAGES/DRESSINGS) ×2 IMPLANT
SUT MNCRL AB 3-0 PS2 27 (SUTURE) ×2 IMPLANT
SUT PROLENE 0 CT 1 30 (SUTURE) IMPLANT
SUT PROLENE 0 CT 2 (SUTURE) ×2 IMPLANT
SUT VIC AB 2-0 BRD 54 (SUTURE) ×2 IMPLANT
SUT VIC AB 2-0 CT1 27 (SUTURE)
SUT VIC AB 2-0 CT1 TAPERPNT 27 (SUTURE) IMPLANT
SUT VIC AB 2-0 CT2 27 (SUTURE) ×2 IMPLANT
SUT VIC AB 3-0 SH 27 (SUTURE) ×1
SUT VIC AB 3-0 SH 27X BRD (SUTURE) ×1 IMPLANT
SUT VIC AB 4-0 FS2 27 (SUTURE) IMPLANT
SUT VICRYL+ 3-0 144IN (SUTURE) IMPLANT
SWABSTK COMLB BENZOIN TINCTURE (MISCELLANEOUS) IMPLANT
SYR CONTROL 10ML (SYRINGE) IMPLANT

## 2016-02-19 NOTE — Anesthesia Preprocedure Evaluation (Signed)
Anesthesia Evaluation  Patient identified by MRN, date of birth, ID band Patient awake    Reviewed: Allergy & Precautions, H&P , NPO status , Patient's Chart, lab work & pertinent test results, reviewed documented beta blocker date and time   History of Anesthesia Complications Negative for: history of anesthetic complications  Airway Mallampati: II  TM Distance: >3 FB Neck ROM: full    Dental  (+) Teeth Intact   Pulmonary neg shortness of breath, asthma , neg sleep apnea, neg COPD, neg recent URI, Current Smoker,           Cardiovascular Exercise Tolerance: Good hypertension, (-) angina(-) CAD, (-) Past MI, (-) Cardiac Stents and (-) CABG (-) dysrhythmias (-) Valvular Problems/Murmurs     Neuro/Psych Seizures -, Poorly Controlled,  PSYCHIATRIC DISORDERS (Bipolar 1)    GI/Hepatic negative GI ROS, NAFLD   Endo/Other  negative endocrine ROS  Renal/GU negative Renal ROS  negative genitourinary   Musculoskeletal   Abdominal   Peds  Hematology negative hematology ROS (+)   Anesthesia Other Findings Past Medical History: No date: Asthma No date: Bipolar 1 disorder (HCC) No date: Fatty liver No date: Hyperlipemia No date: Hypertension No date: Seizures (HCC)  Patient last ate at 7:00 pm.  Per Dr. Evette CristalSankar this is an emergency, so plan for RSI with cricoid pressure.  Patient is aware of the risks of anesthesia including seizure and aspiration.  Reproductive/Obstetrics negative OB ROS                             Anesthesia Physical Anesthesia Plan  ASA: II and emergent  Anesthesia Plan: Rapid Sequence, Cricoid Pressure and General ETT   Post-op Pain Management:    Induction:   Airway Management Planned:   Additional Equipment:   Intra-op Plan:   Post-operative Plan:   Informed Consent: I have reviewed the patients History and Physical, chart, labs and discussed the procedure  including the risks, benefits and alternatives for the proposed anesthesia with the patient or authorized representative who has indicated his/her understanding and acceptance.   Dental Advisory Given  Plan Discussed with: Anesthesiologist, CRNA and Surgeon  Anesthesia Plan Comments:         Anesthesia Quick Evaluation

## 2016-02-19 NOTE — Progress Notes (Signed)
Pt A and O x 4. VSS. Pt tolerating diet well. No complaints of pain or nausea. IV removed intact, prescriptions given. Pt voiced understanding of discharge instructions with no further questions. Pt discharged via wheelchair with nurse aide.   

## 2016-02-19 NOTE — H&P (Signed)
Kristina Schultz is an 45 y.o. female.   Chief Complaint: Significant abdominal pain HPI: This is a 45 year old female who presents with complaint of pain just above the umbilicus which started earlier in the day has persisted all day long. She was aware that she has hernia at that site few weeks ago but has not sought attention for it. She was evaluated by GI service because of the epigastric pains that she has had . She was scheduled to have an endoscopy a few days ago when she had canceled it. She states the pain is pretty well centered around the hernia which is becoming somewhat firm and radiates along the left side of the abdomen. No associated nausea vomiting. No recent bowel problems. Pertinent history is that she has had 3 episodes of seizures to them which were witnessed by her friend. First episode was in January and she did see a neurologist had an EEG done report on this is not available. Most recent episode was about 3-4 days ago. Each episode lasts about 3 minutes. Patient did not follow-up with the neurologist has certainly not on any medications for his seizure activity.  Past Medical History:  Diagnosis Date  . Asthma   . Bipolar 1 disorder ([place])   . Fatty liver   . Hyperlipemia   . Hypertension   . Seizures ([place])     Past Surgical History:  Procedure Laterality Date  . CYST REMOVAL TRUNK      No family history on file. Social History:  reports that she has been smoking Cigars.  She has been smoking about 0.25 packs per day. She has never used smokeless tobacco. She reports that she does not drink alcohol or use drugs.  Allergies:  Allergies  Allergen Reactions  . Atenolol Anaphylaxis  . Celexa [Citalopram Hydrobromide] Other (See Comments)    nosebleed  . Lisinopril Other (See Comments)    Pancreatitis   . Penicillins Swelling  . Prednisone Other (See Comments)    Dizzy and nausea  . Sulfa Antibiotics Rash     (Not in a hospital admission)  Results for orders  placed or performed during the hospital encounter of 02/18/16 (from the past 48 hour(s))  Lipase, blood     Status: None   Collection Time: 02/18/16 10:27 PM  Result Value Ref Range   Lipase 20 11 - 51 U/L  Comprehensive metabolic panel     Status: Abnormal   Collection Time: 02/18/16 10:27 PM  Result Value Ref Range   Sodium 133 (L) 135 - 145 mmol/L   Potassium 3.8 3.5 - 5.1 mmol/L   Chloride 99 (L) 101 - 111 mmol/L   CO2 26 22 - 32 mmol/L   Glucose, Bld 128 (H) 65 - 99 mg/dL   BUN 16 6 - 20 mg/dL   Creatinine, Ser 1.06 (H) 0.44 - 1.00 mg/dL   Calcium 9.3 8.9 - 10.3 mg/dL   Total Protein 7.9 6.5 - 8.1 g/dL   Albumin 3.9 3.5 - 5.0 g/dL   AST 23 15 - 41 U/L   ALT 14 14 - 54 U/L   Alkaline Phosphatase 76 38 - 126 U/L   Total Bilirubin 0.5 0.3 - 1.2 mg/dL   GFR calc non Af Amer >60 >60 mL/min   GFR calc Af Amer >60 >60 mL/min    Comment: (NOTE) The eGFR has been calculated using the CKD EPI equation. This calculation has not been validated in all clinical situations. eGFR's persistently <60 mL/min signify  possible Chronic Kidney Disease.    Anion gap 8 5 - 15  CBC     Status: Abnormal   Collection Time: 02/18/16 10:27 PM  Result Value Ref Range   WBC 14.2 (H) 3.6 - 11.0 K/uL   RBC 4.19 3.80 - 5.20 MIL/uL   Hemoglobin 12.4 12.0 - 16.0 g/dL   HCT 36.7 35.0 - 47.0 %   MCV 87.4 80.0 - 100.0 fL   MCH 29.5 26.0 - 34.0 pg   MCHC 33.8 32.0 - 36.0 g/dL   RDW 13.9 11.5 - 14.5 %   Platelets 225 150 - 440 K/uL  Urinalysis complete, with microscopic     Status: Abnormal   Collection Time: 02/18/16 10:27 PM  Result Value Ref Range   Color, Urine YELLOW (A) YELLOW   APPearance CLOUDY (A) CLEAR   Glucose, UA NEGATIVE NEGATIVE mg/dL   Bilirubin Urine NEGATIVE NEGATIVE   Ketones, ur NEGATIVE NEGATIVE mg/dL   Specific Gravity, Urine 1.021 1.005 - 1.030   Hgb urine dipstick 2+ (A) NEGATIVE   pH 5.0 5.0 - 8.0   Protein, ur 30 (A) NEGATIVE mg/dL   Nitrite NEGATIVE NEGATIVE    Leukocytes, UA NEGATIVE NEGATIVE   RBC / HPF 6-30 0 - 5 RBC/hpf   WBC, UA 6-30 0 - 5 WBC/hpf   Bacteria, UA NONE SEEN NONE SEEN   Squamous Epithelial / LPF TOO NUMEROUS TO COUNT (A) NONE SEEN   Mucous PRESENT   POC urine preg, ED     Status: None   Collection Time: 02/18/16 10:39 PM  Result Value Ref Range   Preg Test, Ur Negative Negative   Ct Abdomen Pelvis W Contrast  Result Date: 02/19/2016 CLINICAL DATA:  Epigastric abdominal pain radiating to the left flank for 1 month with worsening symptoms. EXAM: CT ABDOMEN AND PELVIS WITH CONTRAST TECHNIQUE: Multidetector CT imaging of the abdomen and pelvis was performed using the standard protocol following bolus administration of intravenous contrast. CONTRAST:  137m ISOVUE-300 IOPAMIDOL (ISOVUE-300) INJECTION 61% COMPARISON:  09/04/2015 unenhanced CT abdomen/ pelvis. 11/12/2015 MRI abdomen. FINDINGS: Lower chest: Basilar right lower lobe 4 mm solid pulmonary lung nodule (series 4/image 4) is stable since 01/30/2013 and considered benign. Hepatobiliary: Diffuse hepatic steatosis. No liver mass. Normal gallbladder with no radiopaque cholelithiasis. No biliary ductal dilatation. Pancreas: Normal, with no mass or duct dilation. Spleen: Normal size. No mass. Adrenals/Urinary Tract: Normal adrenals. Normal kidneys with no hydronephrosis and no renal mass. Normal bladder. Stomach/Bowel: Grossly normal stomach. Normal caliber small bowel with no small bowel wall thickening. Normal appendix . Moderate diverticulosis of the left colon, with no large bowel wall thickening or pericolonic fat stranding. Vascular/Lymphatic: Atherosclerotic nonaneurysmal abdominal aorta. Patent portal, splenic, hepatic and renal veins. No pathologically enlarged lymph nodes in the abdomen or pelvis. Reproductive: Grossly normal anteverted uterus.  No adnexal mass. Other: No pneumoperitoneum, ascites or focal fluid collection. There is a moderate left paramedian fat containing  supraumbilical ventral abdominal wall hernia, which has increased in size since 09/04/2015 and demonstrates internal fat stranding with no bowel loops or focal drainable fluid collections. Hernia neck measures 2.4 cm diameter. Musculoskeletal: No aggressive appearing focal osseous lesions. Mild thoracolumbar spondylosis. IMPRESSION: 1. Interval growth of moderate left paramedian fat containing supraumbilical ventral abdominal wall hernia, which is probably incarcerated given internal fat stranding. No bowel loops or focal drainable fluid collections within this hernia. 2. Additional findings include aortic atherosclerosis, diffuse hepatic steatosis and moderate distal colonic diverticulosis. Electronically Signed   By: JCorene Cornea  A Poff M.D.   On: 02/19/2016 00:07    Review of Systems  Constitutional: Negative.   Respiratory: Negative.   Cardiovascular: Negative.   Gastrointestinal: Positive for abdominal pain. Negative for constipation, diarrhea, nausea and vomiting.  Genitourinary: Negative.   Neurological: Positive for seizures.    Blood pressure (!) 146/110, pulse 75, temperature 98.3 F (36.8 C), temperature source Oral, resp. rate 18, height 6' (1.829 m), weight 270 lb (122.5 kg), last menstrual period 02/04/2016, SpO2 99 %. Physical Exam  Constitutional: She is oriented to person, place, and time. She appears well-developed and well-nourished.  Eyes: Conjunctivae are normal. No scleral icterus.  Neck: Neck supple.  Cardiovascular: Normal rate, regular rhythm and normal heart sounds.   Respiratory: Effort normal and breath sounds normal.  GI: Soft. Bowel sounds are normal. There is tenderness in the periumbilical area. A hernia is present. Hernia confirmed positive in the ventral area.    Neurological: She is alert and oriented to person, place, and time.  Skin: Skin is warm and dry.     Assessment/Plan A CT scan was reviewed showing herniated omentum which was incarcerated with the  fascial defect of 2.3 mm size. Her white count was mildly elevated and given the persistent pain and irreducible nature operative repair is indicated. Patient was advised fully on this. In view of her seizure history which is not controlled I have asked for hospitalist to  evaluate the patient. Procedure was explained fully to the patient and she is agreeable   02/19/2016, 1:46 AM

## 2016-02-19 NOTE — ED Provider Notes (Signed)
I assumed care of the patient from Dr. Derrill KayGoodman 12:00 AM CT scan pending. CT scan revealed: CLINICAL DATA:  Epigastric abdominal pain radiating to the left flank for 1 month with worsening symptoms.  EXAM: CT ABDOMEN AND PELVIS WITH CONTRAST  TECHNIQUE: Multidetector CT imaging of the abdomen and pelvis was performed using the standard protocol following bolus administration of intravenous contrast.  CONTRAST:  125mL ISOVUE-300 IOPAMIDOL (ISOVUE-300) INJECTION 61%  COMPARISON:  09/04/2015 unenhanced CT abdomen/ pelvis. 11/12/2015 MRI abdomen.  FINDINGS: Lower chest: Basilar right lower lobe 4 mm solid pulmonary lung nodule (series 4/image 4) is stable since 01/30/2013 and considered benign.  Hepatobiliary: Diffuse hepatic steatosis. No liver mass. Normal gallbladder with no radiopaque cholelithiasis. No biliary ductal dilatation.  Pancreas: Normal, with no mass or duct dilation.  Spleen: Normal size. No mass.  Adrenals/Urinary Tract: Normal adrenals. Normal kidneys with no hydronephrosis and no renal mass. Normal bladder.  Stomach/Bowel: Grossly normal stomach. Normal caliber small bowel with no small bowel wall thickening. Normal appendix . Moderate diverticulosis of the left colon, with no large bowel wall thickening or pericolonic fat stranding.  Vascular/Lymphatic: Atherosclerotic nonaneurysmal abdominal aorta. Patent portal, splenic, hepatic and renal veins. No pathologically enlarged lymph nodes in the abdomen or pelvis.  Reproductive: Grossly normal anteverted uterus.  No adnexal mass.  Other: No pneumoperitoneum, ascites or focal fluid collection. There is a moderate left paramedian fat containing supraumbilical ventral abdominal wall hernia, which has increased in size since 09/04/2015 and demonstrates internal fat stranding with no bowel loops or focal drainable fluid collections. Hernia neck measures 2.4 cm diameter.  Musculoskeletal: No  aggressive appearing focal osseous lesions. Mild thoracolumbar spondylosis.  IMPRESSION: 1. Interval growth of moderate left paramedian fat containing supraumbilical ventral abdominal wall hernia, which is probably incarcerated given internal fat stranding. No bowel loops or focal drainable fluid collections within this hernia. 2. Additional findings include aortic atherosclerosis, diffuse hepatic steatosis and moderate distal colonic diverticulosis.   Electronically Signed   By: Delbert PhenixJason A Poff M.D.   On: 02/19/2016 00:07  Patient discussed with Dr. Alroy DustSankar Gen. surgeon on call who plans to evaluate patient in the emergency department. Patient admitted to the hospital   Darci Currentandolph N Brown, MD 02/19/16 [phone]

## 2016-02-19 NOTE — Consult Note (Addendum)
Reason for Consult: History of seizure Referring Physician: Jamal Collin, MD  Kristina Schultz is an 45 y.o. female.  HPI: The patient with past medical history of hypertension and recent onset of seizures in January 2018 presents emergency department complaining of abdominal pain. She was found to have an incarcerated umbilical hernia. Due to concern for her recent onset of seizure activity (3 seizures each of which were approximately 2 months apart) the surgery service requested input regarding perioperative seizure risk.  Past Medical History:  Diagnosis Date  . Asthma   . Bipolar 1 disorder ([place])   . Fatty liver   . Hyperlipemia   . Hypertension   . Seizures ([place])     Past Surgical History:  Procedure Laterality Date  . CYST REMOVAL TRUNK      Family History  Problem Relation Age of Onset  . Transient ischemic attack Father   . Breast cancer Sister     Social History:  reports that she has been smoking Cigars.  She has been smoking about 0.25 packs per day. She has never used smokeless tobacco. She reports that she does not drink alcohol or use drugs.  Allergies:  Allergies  Allergen Reactions  . Atenolol Anaphylaxis  . Celexa [Citalopram Hydrobromide] Other (See Comments)    nosebleed  . Lisinopril Other (See Comments)    Pancreatitis   . Penicillins Swelling  . Prednisone Other (See Comments)    Dizzy and nausea  . Sulfa Antibiotics Rash    Medications:  Prior to Admission:  (Not in a hospital admission) Scheduled: . Chlorhexidine Gluconate Cloth  6 each Topical Once   PRN:  Results for orders placed or performed during the hospital encounter of 02/18/16 (from the past 48 hour(s))  Lipase, blood     Status: None   Collection Time: 02/18/16 10:27 PM  Result Value Ref Range   Lipase 20 11 - 51 U/L  Comprehensive metabolic panel     Status: Abnormal   Collection Time: 02/18/16 10:27 PM  Result Value Ref Range   Sodium 133 (L) 135 - 145 mmol/L   Potassium 3.8 3.5  - 5.1 mmol/L   Chloride 99 (L) 101 - 111 mmol/L   CO2 26 22 - 32 mmol/L   Glucose, Bld 128 (H) 65 - 99 mg/dL   BUN 16 6 - 20 mg/dL   Creatinine, Ser 1.06 (H) 0.44 - 1.00 mg/dL   Calcium 9.3 8.9 - 10.3 mg/dL   Total Protein 7.9 6.5 - 8.1 g/dL   Albumin 3.9 3.5 - 5.0 g/dL   AST 23 15 - 41 U/L   ALT 14 14 - 54 U/L   Alkaline Phosphatase 76 38 - 126 U/L   Total Bilirubin 0.5 0.3 - 1.2 mg/dL   GFR calc non Af Amer >60 >60 mL/min   GFR calc Af Amer >60 >60 mL/min    Comment: (NOTE) The eGFR has been calculated using the CKD EPI equation. This calculation has not been validated in all clinical situations. eGFR's persistently <60 mL/min signify possible Chronic Kidney Disease.    Anion gap 8 5 - 15  CBC     Status: Abnormal   Collection Time: 02/18/16 10:27 PM  Result Value Ref Range   WBC 14.2 (H) 3.6 - 11.0 K/uL   RBC 4.19 3.80 - 5.20 MIL/uL   Hemoglobin 12.4 12.0 - 16.0 g/dL   HCT 36.7 35.0 - 47.0 %   MCV 87.4 80.0 - 100.0 fL   MCH 29.5 26.0 -  34.0 pg   MCHC 33.8 32.0 - 36.0 g/dL   RDW 13.9 11.5 - 14.5 %   Platelets 225 150 - 440 K/uL  Urinalysis complete, with microscopic     Status: Abnormal   Collection Time: 02/18/16 10:27 PM  Result Value Ref Range   Color, Urine YELLOW (A) YELLOW   APPearance CLOUDY (A) CLEAR   Glucose, UA NEGATIVE NEGATIVE mg/dL   Bilirubin Urine NEGATIVE NEGATIVE   Ketones, ur NEGATIVE NEGATIVE mg/dL   Specific Gravity, Urine 1.021 1.005 - 1.030   Hgb urine dipstick 2+ (A) NEGATIVE   pH 5.0 5.0 - 8.0   Protein, ur 30 (A) NEGATIVE mg/dL   Nitrite NEGATIVE NEGATIVE   Leukocytes, UA NEGATIVE NEGATIVE   RBC / HPF 6-30 0 - 5 RBC/hpf   WBC, UA 6-30 0 - 5 WBC/hpf   Bacteria, UA NONE SEEN NONE SEEN   Squamous Epithelial / LPF TOO NUMEROUS TO COUNT (A) NONE SEEN   Mucous PRESENT   POC urine preg, ED     Status: None   Collection Time: 02/18/16 10:39 PM  Result Value Ref Range   Preg Test, Ur Negative Negative    Ct Abdomen Pelvis W  Contrast  Result Date: 02/19/2016 CLINICAL DATA:  Epigastric abdominal pain radiating to the left flank for 1 month with worsening symptoms. EXAM: CT ABDOMEN AND PELVIS WITH CONTRAST TECHNIQUE: Multidetector CT imaging of the abdomen and pelvis was performed using the standard protocol following bolus administration of intravenous contrast. CONTRAST:  129m ISOVUE-300 IOPAMIDOL (ISOVUE-300) INJECTION 61% COMPARISON:  09/04/2015 unenhanced CT abdomen/ pelvis. 11/12/2015 MRI abdomen. FINDINGS: Lower chest: Basilar right lower lobe 4 mm solid pulmonary lung nodule (series 4/image 4) is stable since 01/30/2013 and considered benign. Hepatobiliary: Diffuse hepatic steatosis. No liver mass. Normal gallbladder with no radiopaque cholelithiasis. No biliary ductal dilatation. Pancreas: Normal, with no mass or duct dilation. Spleen: Normal size. No mass. Adrenals/Urinary Tract: Normal adrenals. Normal kidneys with no hydronephrosis and no renal mass. Normal bladder. Stomach/Bowel: Grossly normal stomach. Normal caliber small bowel with no small bowel wall thickening. Normal appendix . Moderate diverticulosis of the left colon, with no large bowel wall thickening or pericolonic fat stranding. Vascular/Lymphatic: Atherosclerotic nonaneurysmal abdominal aorta. Patent portal, splenic, hepatic and renal veins. No pathologically enlarged lymph nodes in the abdomen or pelvis. Reproductive: Grossly normal anteverted uterus.  No adnexal mass. Other: No pneumoperitoneum, ascites or focal fluid collection. There is a moderate left paramedian fat containing supraumbilical ventral abdominal wall hernia, which has increased in size since 09/04/2015 and demonstrates internal fat stranding with no bowel loops or focal drainable fluid collections. Hernia neck measures 2.4 cm diameter. Musculoskeletal: No aggressive appearing focal osseous lesions. Mild thoracolumbar spondylosis. IMPRESSION: 1. Interval growth of moderate left paramedian fat  containing supraumbilical ventral abdominal wall hernia, which is probably incarcerated given internal fat stranding. No bowel loops or focal drainable fluid collections within this hernia. 2. Additional findings include aortic atherosclerosis, diffuse hepatic steatosis and moderate distal colonic diverticulosis. Electronically Signed   By: JIlona SorrelM.D.   On: 02/19/2016 00:07    Review of Systems  Constitutional: Negative for chills and fever.  HENT: Negative for sore throat and tinnitus.   Eyes: Negative for blurred vision and redness.  Respiratory: Negative for cough and shortness of breath.   Cardiovascular: Negative for chest pain, palpitations, orthopnea and PND.  Gastrointestinal: Positive for abdominal pain. Negative for diarrhea, nausea and vomiting.  Genitourinary: Negative for dysuria, frequency and urgency.  Musculoskeletal: Negative for joint pain and myalgias.  Skin: Negative for rash.       No lesions  Neurological: Positive for seizures (last one in June). Negative for speech change, focal weakness and weakness.  Endo/Heme/Allergies: Does not bruise/bleed easily.       No temperature intolerance  Psychiatric/Behavioral: Negative for depression and suicidal ideas.   Blood pressure (!) 146/110, pulse 75, temperature 98.3 F (36.8 C), temperature source Oral, resp. rate 18, height 6' (1.829 m), weight 122.5 kg (270 lb), last menstrual period 02/04/2016, SpO2 99 %. Physical Exam  Vitals reviewed. Constitutional: She is oriented to person, place, and time. She appears well-developed and well-nourished. No distress.  HENT:  Head: Normocephalic and atraumatic.  Mouth/Throat: Oropharynx is clear and moist.  Eyes: Conjunctivae and EOM are normal. Pupils are equal, round, and reactive to light. No scleral icterus.  Neck: Normal range of motion. Neck supple. No JVD present. No tracheal deviation present. No thyromegaly present.  Cardiovascular: Normal rate, regular rhythm and  normal heart sounds.  Exam reveals no gallop and no friction rub.   No murmur heard. Respiratory: Effort normal and breath sounds normal.  GI: Soft. Bowel sounds are normal. She exhibits no distension. There is tenderness.  Non-reducible ventral hernia  Genitourinary:  Genitourinary Comments: Deferred  Musculoskeletal: Normal range of motion. She exhibits no edema.  Lymphadenopathy:    She has no cervical adenopathy.  Neurological: She is alert and oriented to person, place, and time. No cranial nerve deficit. She exhibits normal muscle tone.  Skin: Skin is warm and dry. No rash noted. No erythema.  Psychiatric: She has a normal mood and affect. Her behavior is normal. Judgment and thought content normal.    Assessment/Plan: This is a 45 year old female with incarcerated hernia and recent seizures admitted to surgery service for urgent repair. 1. Incarcerated hernia: Associated leukocytosis; routine preoperative antibiotics. Follow postoperatively for signs or symptoms of sepsis with source including wound infection, urine and postoperative pneumonia. Early ambulation should mitigate the risk of pneumonia. The patient's perioperative cardiovascular risk is low. 2. Seizure disorder: The patient denies childhood seizures. She has not had head trauma nor does she drink alcohol. There is no indication to seizures occur with decreased oxygenation. I suspect that the seizures have started due to physiologic stress, i.e. leukocytosis and systemic inflammatory response possibly secondary to her hernia which may have transiently been trapped since January at the onset of her seizure activity. There is not anything in particular to lower her seizure risk this time. Postoperatively we will use antiepileptics as needed if the patient were to have a seizure. Recommend formal neurology consult including EEG (it is unclear if the patient has undergone this procedure already or if it is just scheduled). The  patient has a neurology appointment for tomorrow which will obviously need to be rescheduled as she will still be in the hospital. Thank you for involving [place] in the care of this patient. We will be happy to follow if there are any further medical issues.  Harrie Foreman 02/19/2016, 2:02 AM

## 2016-02-19 NOTE — Op Note (Signed)
Preop diagnosis: Incarcerated ventral hernia  Post op diagnosis: Same  Operation: Repair of incarcerated ventral hernia  Surgeon: Kathreen CosierS. G. Sankar  Assistant:     Anesthesia: Gen.  Complications: None  EBL: 1-2 mL  Drains: None  Description: This patient had presented with a painful tender a hernia just above the umbilicus. CT is suggested that this was all fatty tissue likely omentum incarcerated in this region. The fascial defect measured approximately 2 cm. Patient was put to sleep in supine position the operating table and the abdomen prepped and draped as sterile field. Timeout was performed. A vertical incision was made overlying the hernia located just above the umbilicus about 5 cm lon in this incision was extended down and in the deep subcutaneous plane the hernial protrusion was identified which was then circumferentially freed. Part of the hernia was a bulk large portion of preperitoneal fat. This was dissected off from the hernial sac and ligated with 2-0 Vicryl and removed. The sac was opened and was noted there was no contents at this time but there was omentum right at the neck of this hernia this was pulled up to inspected and noted to be intact and free of any signs of devitalization. This was pushed back into the abdominal cavity ,the peritoneal sac was then excised out in the sac opening was closed with a running 2-0 Vicryl. The fascial edges were then adequately exposed and in a vertical orientation 3 interrupted figure-of-eight stitches of 0 Prolene were used to close the defect. Wound was irrigated. The deeper tissues closed with 201 3-0 Vicryl. Skin closed with subcuticular 3-0 Monocryl stitch and reinforced with Steri-Strips and tincture benzoin. Telfa and Tegaderm dressings were placed. Patient was subsequently extubated and returned recovery room stable condition

## 2016-02-19 NOTE — Progress Notes (Signed)
[facility] [place] at [facility]                                                                                                                                                                                            Patient Demographics   Kristina Schultz, is a 45 y.o. female, DOB - [birth year], ZOX:[id number]  Admit date - 02/18/2016   Admitting Physician Seeplaputhur Wynona Luna, MD  Outpatient Primary MD for the patient is [facility]   LOS - 0  Subjective: Patient tolerated the surgery well denies any abdominal pain or nausea tolerating a regular diet Patient has had 3 episodes of seizure within the past year. He was recommended to start Keppra which she has not. She does have a neurology consult to be done as outpatient for tomorrow.    Review of Systems:   CONSTITUTIONAL: No documented fever. No fatigue, weakness. No weight gain, no weight loss.  EYES: No blurry or double vision.  ENT: No tinnitus. No postnasal drip. No redness of the oropharynx.  RESPIRATORY: No cough, no wheeze, no hemoptysis. No dyspnea.  CARDIOVASCULAR: No chest pain. No orthopnea. No palpitations. No syncope.  GASTROINTESTINAL: No nausea, no vomiting or diarrhea. No abdominal pain. No melena or hematochezia.  GENITOURINARY: No dysuria or hematuria.  ENDOCRINE: No polyuria or nocturia. No heat or cold intolerance.  HEMATOLOGY: No anemia. No bruising. No bleeding.  INTEGUMENTARY: No rashes. No lesions.  MUSCULOSKELETAL: No arthritis. No swelling. No gout.  NEUROLOGIC: No numbness, tingling, or ataxia. Positive seizure-type activity.  PSYCHIATRIC: No anxiety. No insomnia. No ADD.    Vitals:   Vitals:   02/19/16 0424 02/19/16 0428 02/19/16 0441 02/19/16 0633  BP: (!) 141/95  (!) 146/94 (!) 143/86  Pulse: 90 89 88 92  Resp: (!) 22 (!) 21 16 20   Temp: 97 F (36.1 C)  97.7 F (36.5 C) 98.1 F (36.7 C)  TempSrc:   Oral Oral  SpO2: 98% 99% 100% 98%  Weight:   273  lb 4.8 oz (124 kg)   Height:   6' (1.829 m)     Wt Readings from Last 3 Encounters:  02/19/16 273 lb 4.8 oz (124 kg)  11/12/15 270 lb (122.5 kg)  11/12/15 270 lb (122.5 kg)     Intake/Output Summary (Last 24 hours) at 02/19/16 1241 Last data filed at 02/19/16 1220  Gross per 24 hour  Intake           1172.5 ml  Output              705 ml  Net  467.5 ml    Physical Exam:   GENERAL: Pleasant-appearing in no apparent distress.  HEAD, EYES, EARS, NOSE AND THROAT: Atraumatic, normocephalic. Extraocular muscles are intact. Pupils equal and reactive to light. Sclerae anicteric. No conjunctival injection. No oro-pharyngeal erythema.  NECK: Supple. There is no jugular venous distention. No bruits, no lymphadenopathy, no thyromegaly.  HEART: Regular rate and rhythm,. No murmurs, no rubs, no clicks.  LUNGS: Clear to auscultation bilaterally. No rales or rhonchi. No wheezes.  ABDOMEN: Soft, flat, nontender, nondistended. Has good bowel sounds. No hepatosplenomegaly appreciated.  EXTREMITIES: No evidence of any cyanosis, clubbing, or peripheral edema.  +2 pedal and radial pulses bilaterally.  NEUROLOGIC: The patient is alert, awake, and oriented x3 with no focal motor or sensory deficits appreciated bilaterally.  SKIN: Moist and warm with no rashes appreciated.  Psych: Not anxious, depressed LN: No inguinal LN enlargement    Antibiotics   Anti-infectives    None      Medications   Scheduled Meds: . levETIRAcetam  750 mg Oral BID   Continuous Infusions:  PRN Meds:.acetaminophen **OR** acetaminophen, morphine injection, oxyCODONE   Data Review:   Micro Results No results found for this or any previous visit (from the past 240 hour(s)).  Radiology Reports Ct Abdomen Pelvis W Contrast  Result Date: 02/19/2016 CLINICAL DATA:  Epigastric abdominal pain radiating to the left flank for 1 month with worsening symptoms. EXAM: CT ABDOMEN AND PELVIS WITH CONTRAST TECHNIQUE:  Multidetector CT imaging of the abdomen and pelvis was performed using the standard protocol following bolus administration of intravenous contrast. CONTRAST:  125mL ISOVUE-300 IOPAMIDOL (ISOVUE-300) INJECTION 61% COMPARISON:  09/04/2015 unenhanced CT abdomen/ pelvis. 11/12/2015 MRI abdomen. FINDINGS: Lower chest: Basilar right lower lobe 4 mm solid pulmonary lung nodule (series 4/image 4) is stable since 01/30/2013 and considered benign. Hepatobiliary: Diffuse hepatic steatosis. No liver mass. Normal gallbladder with no radiopaque cholelithiasis. No biliary ductal dilatation. Pancreas: Normal, with no mass or duct dilation. Spleen: Normal size. No mass. Adrenals/Urinary Tract: Normal adrenals. Normal kidneys with no hydronephrosis and no renal mass. Normal bladder. Stomach/Bowel: Grossly normal stomach. Normal caliber small bowel with no small bowel wall thickening. Normal appendix . Moderate diverticulosis of the left colon, with no large bowel wall thickening or pericolonic fat stranding. Vascular/Lymphatic: Atherosclerotic nonaneurysmal abdominal aorta. Patent portal, splenic, hepatic and renal veins. No pathologically enlarged lymph nodes in the abdomen or pelvis. Reproductive: Grossly normal anteverted uterus.  No adnexal mass. Other: No pneumoperitoneum, ascites or focal fluid collection. There is a moderate left paramedian fat containing supraumbilical ventral abdominal wall hernia, which has increased in size since 09/04/2015 and demonstrates internal fat stranding with no bowel loops or focal drainable fluid collections. Hernia neck measures 2.4 cm diameter. Musculoskeletal: No aggressive appearing focal osseous lesions. Mild thoracolumbar spondylosis. IMPRESSION: 1. Interval growth of moderate left paramedian fat containing supraumbilical ventral abdominal wall hernia, which is probably incarcerated given internal fat stranding. No bowel loops or focal drainable fluid collections within this hernia. 2.  Additional findings include aortic atherosclerosis, diffuse hepatic steatosis and moderate distal colonic diverticulosis. Electronically Signed   By: Delbert PhenixJason A Poff M.D.   On: 02/19/2016 00:07     CBC  Recent Labs Lab 02/18/16 2227  WBC 14.2*  HGB 12.4  HCT 36.7  PLT 225  MCV 87.4  MCH 29.5  MCHC 33.8  RDW 13.9    Chemistries   Recent Labs Lab 02/18/16 2227  NA 133*  K 3.8  CL 99*  CO2 26  GLUCOSE 128*  BUN 16  CREATININE 1.06*  CALCIUM 9.3  AST 23  ALT 14  ALKPHOS 76  BILITOT 0.5   ------------------------------------------------------------------------------------------------------------------ estimated creatinine clearance is 100 mL/min (by C-G formula based on SCr of 1.06 mg/dL (H)). ------------------------------------------------------------------------------------------------------------------ No results for input(s): HGBA1C in the last 72 hours. ------------------------------------------------------------------------------------------------------------------ No results for input(s): CHOL, HDL, LDLCALC, TRIG, CHOLHDL, LDLDIRECT in the last 72 hours. ------------------------------------------------------------------------------------------------------------------ No results for input(s): TSH, T4TOTAL, T3FREE, THYROIDAB in the last 72 hours.  Invalid input(s): FREET3 ------------------------------------------------------------------------------------------------------------------ No results for input(s): VITAMINB12, FOLATE, FERRITIN, TIBC, IRON, RETICCTPCT in the last 72 hours.  Coagulation profile No results for input(s): INR, PROTIME in the last 168 hours.  No results for input(s): DDIMER in the last 72 hours.  Cardiac Enzymes No results for input(s): CKMB, TROPONINI, MYOGLOBIN in the last 168 hours.  Invalid input(s): CK ------------------------------------------------------------------------------------------------------------------ Invalid input(s):  POCBNP    Assessment & Plan   Patient is a 45 year old with multiple episodes of seizure-like activity  1. Seizure I have discussed with the patient we will start her on Keppra 750 mg twice a day She will follow-up with Dr. Malvin Johns will need outpatient EEG Recommended that she should not drive for 6 months until she is seizure-free. Patient agreeable to the plan I have given her prescription for Keppra. Okay to be discharged from a medical standpoint.     Code Status Orders        Start     Ordered   02/19/16 0450  Full code  Continuous     02/19/16 0449    Code Status History    Date Active Date Inactive Code Status Order ID Comments User Context   This patient has a current code status but no historical code status.             DVT Prophylaxis ambulatory  Lab Results  Component Value Date   PLT 225 02/18/2016     Time Spent in minutes  Greater than 50% of time spent in care coordination and counseling patient regarding the condition and plan of care.   Auburn Bilberry M.D on 02/19/2016 at 12:41 PM  Between 7am to 6pm - Pager - [phone]  After 6pm go to [email or web address] - password [email or web address] [facility]  [facility]   Office  [phone]

## 2016-02-19 NOTE — Discharge Summary (Signed)
Patient was admitted on 9/23 with an incarcerated ventral hernia. She was taken to the operating room and underwent an open ventral hernia repair with no complications. Postoperative day 1 she was doing well tolerating a diet with appropriate pain control with oral medications and was deemed ready for discharge to home in good condition.

## 2016-02-19 NOTE — Anesthesia Procedure Notes (Signed)
Procedure Name: Intubation Date/Time: 02/19/2016 2:48 AM Performed by: Sherron FlemingsHARVEY, Toba Claudio Pre-anesthesia Checklist: Patient identified, Patient being monitored, Timeout performed, Emergency Drugs available and Suction available Patient Re-evaluated:Patient Re-evaluated prior to inductionOxygen Delivery Method: Circle system utilized Preoxygenation: Pre-oxygenation with 100% oxygen Intubation Type: IV induction and Rapid sequence Ventilation: Mask ventilation without difficulty Laryngoscope Size: Mac and 3 Grade View: Grade III Tube type: Oral Tube size: 7.0 mm Number of attempts: 1 Airway Equipment and Method: Stylet Placement Confirmation: ETT inserted through vocal cords under direct vision,  positive ETCO2 and breath sounds checked- equal and bilateral Secured at: 21 cm Tube secured with: Tape Dental Injury: Teeth and Oropharynx as per pre-operative assessment  Difficulty Due To: Difficult Airway- due to anterior larynx, Difficult Airway- due to limited oral opening and Difficult Airway- due to dentition Future Recommendations: Recommend- induction with short-acting agent, and alternative techniques readily available

## 2016-02-19 NOTE — Transfer of Care (Signed)
Immediate Anesthesia Transfer of Care Note  Patient: Kristina Schultz  Procedure(s) Performed: Procedure(s): incarcerated ventral hernia repair (N/A)  Patient Location: PACU  Anesthesia Type:General  Level of Consciousness: Alert, Awake, Oriented  Airway & Oxygen Therapy: Patient Spontanous Breathing  Post-op Assessment: Report given to RN  Post vital signs: Reviewed and stable  Last Vitals:  Vitals:   02/19/16 0130 02/19/16 0339  BP: (!) 146/110   Pulse: 75 95  Resp: 18 (!) 24  Temp:  36.2 C    Complications: No apparent anesthesia complications

## 2016-02-19 NOTE — Interval H&P Note (Signed)
History and Physical Interval Note:  02/19/2016 2:44 AM  Kristina LauthAnita R Pickering  has presented today for surgery, with the diagnosis of incarcerated ventral hernia  The various methods of treatment have been discussed with the patient and family. After consideration of risks, benefits and other options for treatment, the patient has consented to  Procedure(s): HERNIA REPAIR VENTRAL ADULT (N/A) as a surgical intervention .  The patient's history has been reviewed, patient examined, no change in status, stable for surgery.  I have reviewed the patient's chart and labs.  Questions were answered to the patient's satisfaction.   Procedure is an emergency- She last ate  about 7-7 and 1/2 hrs ago. Anesthesia aware and will proceed   Kieth BrightlySANKAR,Jaymen Fetch G

## 2016-02-20 ENCOUNTER — Encounter: Payer: Self-pay | Admitting: General Surgery

## 2016-02-21 NOTE — Anesthesia Postprocedure Evaluation (Signed)
Anesthesia Post Note  Patient: Kristina Schultz  Procedure(s) Performed: Procedure(s) (LRB): incarcerated ventral hernia repair (N/A)  Patient location during evaluation: PACU Anesthesia Type: General Level of consciousness: awake and alert Pain management: pain level controlled Vital Signs Assessment: post-procedure vital signs reviewed and stable Respiratory status: spontaneous breathing, nonlabored ventilation, respiratory function stable and patient connected to nasal cannula oxygen Cardiovascular status: blood pressure returned to baseline and stable Postop Assessment: no signs of nausea or vomiting Anesthetic complications: no    Last Vitals:  Vitals:   02/19/16 0441 02/19/16 0633  BP: (!) 146/94 (!) 143/86  Pulse: 88 92  Resp: 16 20  Temp: 36.5 C 36.7 C    Last Pain:  Vitals:   02/19/16 1207  TempSrc:   PainSc: 0-No pain                 Lenard SimmerAndrew Trenda Corliss

## 2016-04-26 ENCOUNTER — Emergency Department
Admission: EM | Admit: 2016-04-26 | Discharge: 2016-04-26 | Disposition: A | Discharge status: Home / Self Care | Payer: Self-pay | Attending: Student in an Organized Health Care Education/Training Program | Admitting: Student in an Organized Health Care Education/Training Program

## 2016-04-26 ENCOUNTER — Emergency Department: Payer: Self-pay

## 2016-04-26 DIAGNOSIS — M25561 Pain in right knee: Secondary | ICD-10-CM | POA: Insufficient documentation

## 2016-04-26 DIAGNOSIS — R569 Unspecified convulsions: Secondary | ICD-10-CM

## 2016-04-26 DIAGNOSIS — Z79899 Other long term (current) drug therapy: Secondary | ICD-10-CM | POA: Insufficient documentation

## 2016-04-26 DIAGNOSIS — Z9114 Patient's other noncompliance with medication regimen: Secondary | ICD-10-CM | POA: Insufficient documentation

## 2016-04-26 DIAGNOSIS — I10 Essential (primary) hypertension: Secondary | ICD-10-CM | POA: Insufficient documentation

## 2016-04-26 DIAGNOSIS — G40909 Epilepsy, unspecified, not intractable, without status epilepticus: Secondary | ICD-10-CM | POA: Insufficient documentation

## 2016-04-26 DIAGNOSIS — J45909 Unspecified asthma, uncomplicated: Secondary | ICD-10-CM | POA: Insufficient documentation

## 2016-04-26 DIAGNOSIS — F17290 Nicotine dependence, other tobacco product, uncomplicated: Secondary | ICD-10-CM | POA: Insufficient documentation

## 2016-04-26 LAB — BASIC METABOLIC PANEL
ANION GAP: 11 (ref 5–15)
BUN: 12 mg/dL (ref 6–20)
CALCIUM: 8.9 mg/dL (ref 8.9–10.3)
CHLORIDE: 101 mmol/L (ref 101–111)
CO2: 19 mmol/L — AB (ref 22–32)
Creatinine, Ser: 0.97 mg/dL (ref 0.44–1.00)
GFR calc non Af Amer: >60 mL/min (ref >60)
Glucose, Bld: 148 mg/dL — ABNORMAL HIGH (ref 65–99)
Potassium: 4 mmol/L (ref 3.5–5.1)
SODIUM: 131 mmol/L — AB (ref 135–145)

## 2016-04-26 MED ORDER — SODIUM CHLORIDE 0.9 % IV BOLUS (SEPSIS)
1000.0000 mL | Freq: Once | INTRAVENOUS | Status: AC
  Administered 2016-04-26: 1000 mL via INTRAVENOUS

## 2016-04-26 MED ORDER — ACETAMINOPHEN 500 MG PO TABS
1000.0000 mg | ORAL_TABLET | Freq: Once | ORAL | Status: AC
  Administered 2016-04-26: 1000 mg via ORAL
  Filled 2016-04-26: qty 2

## 2016-04-26 MED ORDER — SODIUM CHLORIDE 0.9 % IV SOLN
1000.0000 mg | Freq: Once | INTRAVENOUS | Status: AC
  Administered 2016-04-26: 1000 mg via INTRAVENOUS
  Filled 2016-04-26: qty 10

## 2016-04-26 MED ORDER — LEVETIRACETAM 750 MG PO TABS: 750.0000 mg | ORAL_TABLET | Freq: Two times a day (BID) | ORAL | 0 refills | Status: DC

## 2016-04-26 NOTE — Discharge Instructions (Signed)
Take Keppra 750 twice daily.    You have been diagnosed with seizures or are suspected of having seizures. Until you are cleared by your family Dr or a Neurologist the precautions listed should be followed:  No driving No tub bathing No operating heavy machinery No climbing to heights, such as on a ladder  Do no activity that would cause you or others injury if another seizure occurred Call your Dr. at the number provided below for a neurology appointment or call your Primary care doctor so that they can clear you or coordinate care with a neurologist who can clear you.

## 2016-04-26 NOTE — ED Triage Notes (Signed)
Patient comes from home via EMS for seizure that started this morning lasted off and on about 10 minutes. EMS reports that patient is not taking her seizure medications due to allergic reaction and she has not seen her doctor. Seizures usually happen at night.

## 2016-04-26 NOTE — ED Notes (Signed)
Family reports patient had a seizure early in the morning last night and then the one that her mother witnessed.

## 2016-04-26 NOTE — ED Provider Notes (Signed)
[facility] Emergency Department Provider Note    First MD Initiated Contact with Patient 04/26/16 1003     (approximate)  I have reviewed the triage vital signs and the nursing notes.   HISTORY  Chief Complaint Seizures    HPI Kristina Schultz is a 45 y.o. female with history of bipolar 1 disorder as well as seizure disorder on Keppra as well as Depakote presents with 2 episodes of seizure-like activity over the past 24 hours. Patient is been off of her antiepileptic drugs for the past week after developing a rash. Patient has been on Keppra for several months now. She was recently started on Lamictal and developed a pruritus and rash. She subsequently stopped both medications one week ago and is having worsening breakthrough seizures. Episode this morning lasted roughly 10 minutes with following postictal period no numbness or tingling. Patient complains of mild headache at this time but is in no acute distress. Denies any fevers. No nausea or vomiting. No chest pain. No shortness of breath.   Past Medical History:  Diagnosis Date  . Asthma   . Bipolar 1 disorder (HCC)   . Fatty liver   . Hyperlipemia   . Hypertension   . Seizures (HCC) 2017   Family History  Problem Relation Age of Onset  . Transient ischemic attack Father   . Breast cancer Sister    Past Surgical History:  Procedure Laterality Date  . CYST REMOVAL TRUNK    . VENTRAL HERNIA REPAIR  02/19/2016   Dr Evette CristalSankar  . VENTRAL HERNIA REPAIR N/A 02/19/2016   Procedure: incarcerated ventral hernia repair;  Surgeon: Kieth BrightlySeeplaputhur G Sankar, MD;  Location: [facility] ORS;  Service: General;  Laterality: N/A;   Patient Active Problem List   Diagnosis Date Noted  . Incarcerated ventral hernia 02/19/2016      Prior to Admission medications   Medication Sig Start Date End Date Taking? Authorizing Provider  acetaminophen (TYLENOL) 325 MG tablet Take 2 tablets (650 mg total) by mouth every 6 (six) hours  as needed for mild pain (or temp > 100). 02/19/16   Henrene DodgeJose Piscoya, MD  albuterol (PROVENTIL HFA;VENTOLIN HFA) 108 (90 BASE) MCG/ACT inhaler Inhale 2 puffs into the lungs every 6 (six) hours as needed for wheezing or shortness of breath. 03/06/15   Jene Everyobert Kinner, MD  levETIRAcetam (KEPPRA) 750 MG tablet Take 1 tablet (750 mg total) by mouth 2 (two) times daily. 02/19/16   Auburn BilberryShreyang Patel, MD  oxyCODONE (OXY IR/ROXICODONE) 5 MG immediate release tablet Take 1-2 tablets (5-10 mg total) by mouth every 4 (four) hours as needed for moderate pain. 02/19/16   Henrene DodgeJose Piscoya, MD  pantoprazole (PROTONIX) 40 MG tablet Take 1 tablet (40 mg total) by mouth daily. 11/09/15   Mercedes Camprubi-Soms, PA-C  sertraline (ZOLOFT) 50 MG tablet Take 50 mg by mouth at bedtime.    Historical Provider, MD  triamcinolone ointment (KENALOG) 0.5 % Apply 1 application topically 2 (two) times daily. Patient taking differently: Apply 1 application topically 2 (two) times daily as needed (skin protection).  01/23/15   Chinita Pesterari B Triplett, FNP    Allergies Atenolol; Celexa [citalopram hydrobromide]; Lisinopril; Penicillins; Prednisone; Keppra [levetiracetam]; and Sulfa antibiotics    Social History Social History  Substance Use Topics  . Smoking status: Current Every Day Smoker    Packs/day: 0.25    Types: Cigars  . Smokeless tobacco: Never Used  . Alcohol use No    Review of Systems Patient denies headaches, rhinorrhea,  blurry vision, numbness, shortness of breath, chest pain, edema, cough, abdominal pain, nausea, vomiting, diarrhea, dysuria, fevers, rashes or hallucinations unless otherwise stated above in HPI. ____________________________________________   PHYSICAL EXAM:  VITAL SIGNS: Vitals:   04/26/16 1000 04/26/16 1002  BP:  129/90  Pulse: 84   Resp: 17   Temp: 98.3 F (36.8 C)     Constitutional: Alert and oriented. in no acute distress. Eyes: Conjunctivae are normal. PERRL. EOMI. Head: Atraumatic. Nose: No  congestion/rhinnorhea. Mouth/Throat: Mucous membranes are moist.  Oropharynx non-erythematous. Neck: No stridor. Painless ROM. No cervical spine tenderness to palpation Hematological/Lymphatic/Immunilogical: No cervical lymphadenopathy. Cardiovascular: Normal rate, regular rhythm. Grossly normal heart sounds.  Good peripheral circulation. Respiratory: Normal respiratory effort.  No retractions. Lungs CTAB. Gastrointestinal: Soft and nontender. No distention. No abdominal bruits. No CVA tenderness.  Musculoskeletal: No lower extremity tenderness nor edema.  No joint effusions. Neurologic:  CN- intact.  No facial droop, Normal FNF.  Normal heel to shin.  Sensation intact bilaterally. Normal speech and language. No gross focal neurologic deficits are appreciated. No gait instability. Skin:  Skin is warm, dry and intact. No rash noted. Psychiatric: Mood and affect are normal. Speech and behavior are normal.  ____________________________________________   LABS (all labs ordered are listed, but only abnormal results are displayed)  Results for orders placed or performed during the hospital encounter of 04/26/16 (from the past 24 hour(s))  Basic metabolic panel     Status: Abnormal   Collection Time: 04/26/16  9:59 AM  Result Value Ref Range   Sodium 131 (L) 135 - 145 mmol/L   Potassium 4.0 3.5 - 5.1 mmol/L   Chloride 101 101 - 111 mmol/L   CO2 19 (L) 22 - 32 mmol/L   Glucose, Bld 148 (H) 65 - 99 mg/dL   BUN 12 6 - 20 mg/dL   Creatinine, Ser 1.610.97 0.44 - 1.00 mg/dL   Calcium 8.9 8.9 - 09.610.3 mg/dL   GFR calc non Af Amer >60 >60 mL/min   GFR calc Af Amer >60 >60 mL/min   Anion gap 11 5 - 15   ____________________________________________  EKG My review and personal interpretation at Time: 9:59   Indication: seizure  Rate: 80  Rhythm: sinus Axis: normal Other: non specific t wave changes consistent with previous EKG, no acute ischemic  changes. ____________________________________________  RADIOLOGY    ____________________________________________   PROCEDURES  Procedure(s) performed: none Procedures    Critical Care performed: no ____________________________________________   INITIAL IMPRESSION / ASSESSMENT AND PLAN / ED COURSE  Pertinent labs & imaging results that were available during my care of the patient were reviewed by me and considered in my medical decision making (see chart for details).  DDX: seizure, medication noncompliance, dehydration, electrolyte abn  Kristina Lauthnita R Coffelt is a 45 y.o. who presents to the ED with recurrent seizures in the setting of being noncompliant with her antiepileptic drugs. Patient is AFVSS in ED. Exam as above. Given current presentation have considered the above differential.  We'll check electrolytes to evaluate for any acute abnormality. We will provide IV Keppra as this seems to been effective and was not causing any rashes. Do not feel CT imaging of the brain clinically indicated at this time.  The patient will be placed on continuous pulse oximetry and telemetry for monitoring.  Laboratory evaluation will be sent to evaluate for the above complaints.     Clinical Course as of Apr 26 1246  Thu Apr 26, 2016  1222 Spoke with  Dr. Malvin Johns who recommends restarting her previous dose of Keppra at 750 twice a day follow-up in clinic early next week. Can continue to hold her Lamictal at this time.    [PR]  1244 Patient was able to tolerate PO and was able to ambulate with a steady gait.  Have discussed with the patient and available family all diagnostics and treatments performed thus far and all questions were answered to the best of my ability. The patient demonstrates understanding and agreement with plan.   [PR]    Clinical Course User Index [PR] Willy Eddy, MD     ____________________________________________   FINAL CLINICAL IMPRESSION(S) / ED DIAGNOSES  Final  diagnoses:  Seizures (HCC)  Acute pain of right knee      NEW MEDICATIONS STARTED DURING THIS VISIT:  New Prescriptions   No medications on file     Note:  This document was prepared using Dragon voice recognition software and may include unintentional dictation errors.    Willy Eddy, MD 04/26/16 [phone]

## 2016-04-26 NOTE — ED Notes (Signed)
ED Provider at bedside. 

## 2016-07-12 DIAGNOSIS — R454 Irritability and anger: Secondary | ICD-10-CM | POA: Insufficient documentation

## 2016-07-12 DIAGNOSIS — E559 Vitamin D deficiency, unspecified: Secondary | ICD-10-CM | POA: Insufficient documentation

## 2016-07-29 ENCOUNTER — Emergency Department
Admission: EM | Admit: 2016-07-29 | Discharge: 2016-07-29 | Disposition: A | Discharge status: Home / Self Care | Payer: BLUE CROSS/BLUE SHIELD | Attending: Emergency Medicine | Admitting: Emergency Medicine

## 2016-07-29 DIAGNOSIS — Z79899 Other long term (current) drug therapy: Secondary | ICD-10-CM | POA: Diagnosis not present

## 2016-07-29 DIAGNOSIS — J45909 Unspecified asthma, uncomplicated: Secondary | ICD-10-CM | POA: Diagnosis not present

## 2016-07-29 DIAGNOSIS — F17290 Nicotine dependence, other tobacco product, uncomplicated: Secondary | ICD-10-CM | POA: Diagnosis not present

## 2016-07-29 DIAGNOSIS — I10 Essential (primary) hypertension: Secondary | ICD-10-CM | POA: Diagnosis not present

## 2016-07-29 DIAGNOSIS — R21 Rash and other nonspecific skin eruption: Secondary | ICD-10-CM | POA: Insufficient documentation

## 2016-07-29 NOTE — ED Notes (Signed)
Pt upset at discharge, sts that ED provider did not touch pt or give pt useful information.  Pt upset about lack of steroid cream.  Relayed information to MD, MD Shaune PollackLord said that she spoke w/ pt about using cortisone 10 cream for face. Pt informed.

## 2016-07-29 NOTE — ED Notes (Signed)
Pt denies rash spreading to neck, back, chest or trunk.

## 2016-07-29 NOTE — Discharge Instructions (Signed)
Although no certain cause was found for your skin rash, giving your history, it may be related to the new drug Trileptal. Please call your neurologist to discuss any change in medication. For now he may discontinue.  Return to the emergency department immediately for any worsening redness, drainage, fever, or any trouble breathing, trouble swallowing, swelling of the tongue or mouth, or any blisters in the mouth or vaginal area.

## 2016-07-29 NOTE — ED Provider Notes (Signed)
[facility] Emergency Department Provider Note ____________________________________________   I have reviewed the triage vital signs and the triage nursing note.  HISTORY  Chief Complaint Allergic Reaction and Rash   Historian Patient  HPI Kristina Schultz is a 46 y.o. female presenting with itchy rash to forehead since Tuesday.  2 weeks ago started new medication Trileptal prescribed by Dr. Malvin Johns with neurology. She is currently on Keppra and had a breakthrough seizure. Because she has bipolar she was started on Trileptal. Patient states that she is fairly sensitive to medications and oftentimes will get different types of skin rashes. She believes she is having a drug eruption. States that she spoke to the on call help line who requested that she come to the ED for further evaluation and mentioned the word Lurline Hare.  Patient denies fever. Denies throat swelling or trouble breathing. She states that her lips feel a little full but do not look for. No tongue swelling. No skin rash anywhere besides the forehead inside the temples. No vision changes. No rhinitis. No nausea vomiting or diarrhea. No mucous membrane involvement.  No blistering.  She's tried benadryl which helps a little bit.    Past Medical History:  Diagnosis Date  . Asthma   . Bipolar 1 disorder (HCC)   . Fatty liver   . Hyperlipemia   . Hypertension   . Seizures (HCC) 2017    Patient Active Problem List   Diagnosis Date Noted  . Incarcerated ventral hernia 02/19/2016    Past Surgical History:  Procedure Laterality Date  . CYST REMOVAL TRUNK    . VENTRAL HERNIA REPAIR  02/19/2016   Dr Evette Cristal  . VENTRAL HERNIA REPAIR N/A 02/19/2016   Procedure: incarcerated ventral hernia repair;  Surgeon: Kieth Brightly, MD;  Location: [facility] ORS;  Service: General;  Laterality: N/A;    Prior to Admission medications   Medication Sig Start Date End Date Taking? Authorizing Provider   acetaminophen (TYLENOL) 325 MG tablet Take 2 tablets (650 mg total) by mouth every 6 (six) hours as needed for mild pain (or temp > 100). 02/19/16   Henrene Dodge, MD  albuterol (PROVENTIL HFA;VENTOLIN HFA) 108 (90 BASE) MCG/ACT inhaler Inhale 2 puffs into the lungs every 6 (six) hours as needed for wheezing or shortness of breath. 03/06/15   Jene Every, MD  LamoTRIgine (LAMICTAL PO) Take 1 tablet by mouth daily.    Historical Provider, MD  levETIRAcetam (KEPPRA) 750 MG tablet Take 1 tablet (750 mg total) by mouth 2 (two) times daily. 04/26/16 05/10/16  Willy Eddy, MD  sertraline (ZOLOFT) 50 MG tablet Take 50 mg by mouth at bedtime.    Historical Provider, MD  triamcinolone ointment (KENALOG) 0.5 % Apply 1 application topically 2 (two) times daily. Patient taking differently: Apply 1 application topically 2 (two) times daily as needed (skin protection).  01/23/15   Chinita Pester, FNP    Allergies  Allergen Reactions  . Atenolol Anaphylaxis  . Lisinopril Other (See Comments)    Pancreatitis   . Penicillins Swelling    Swelling/rash  . Prednisone Other (See Comments)    Dizzy and nausea  . Celexa [Citalopram Hydrobromide] Rash    Nosebleed/rash  . Lamictal [Lamotrigine] Rash    rash  . Sulfa Antibiotics Rash    rash    Family History  Problem Relation Age of Onset  . Transient ischemic attack Father   . Breast cancer Sister     Social History Social History  Substance Use Topics  . Smoking status: Current Every Day Smoker    Packs/day: 0.25    Types: Cigars  . Smokeless tobacco: Never Used  . Alcohol use No    Review of Systems  Constitutional: Negative for fever. Eyes: Negative for visual changes. ENT: Negative for sore throat. Cardiovascular: Negative for chest pain. Respiratory: Negative for shortness of breath. Gastrointestinal: Negative for abdominal pain, vomiting and diarrhea. Genitourinary: Negative for dysuria. Musculoskeletal: Negative for back  pain. Skin: As per hpi Neurological: Negative for headache. 10 point Review of Systems otherwise negative ____________________________________________   PHYSICAL EXAM:  VITAL SIGNS: ED Triage Vitals  Enc Vitals Group     BP 07/29/16 1127 134/84     Pulse Rate 07/29/16 1127 80     Resp 07/29/16 1127 16     Temp 07/29/16 1127 97.6 F (36.4 C)     Temp Source 07/29/16 1127 Oral     SpO2 07/29/16 1127 99 %     Weight 07/29/16 1128 262 lb (118.8 kg)     Height 07/29/16 1128 6' (1.829 m)     Head Circumference --      Peak Flow --      Pain Score 07/29/16 1134 9     Pain Loc --      Pain Edu? --      Excl. in GC? --      Constitutional: Alert and oriented. Well appearing and in no distress. HEENT   Head: Normocephalic and atraumatic.      Eyes: Conjunctivae are normal. PERRL. Normal extraocular movements.      Ears:         Nose: No congestion/rhinnorhea.   Mouth/Throat: Mucous membranes are moist.  Normal tongue and oropharynx.   Neck: No stridor. Cardiovascular/Chest: Normal rate, regular rhythm.  No murmurs, rubs, or gallops. Respiratory: Normal respiratory effort without tachypnea nor retractions. Breath sounds are clear and equal bilaterally. No wheezes/rales/rhonchi. Gastrointestinal: Nontender  Genitourinary/rectal:Deferred Musculoskeletal: Nontender with normal range of motion in all extremities. Neurologic:  Normal speech and language. No gross or focal neurologic deficits are appreciated. Skin:  Dry red bumps across forehead and a few along temples both sides.  No hives or cellulitis.   Psychiatric: Mood and affect are normal. Speech and behavior are normal. Patient exhibits appropriate insight and judgment.   ____________________________________________  LABS (pertinent positives/negatives)  Labs Reviewed - No data to display  ____________________________________________    EKG I, Governor Rooks, MD, the attending physician have personally viewed  and interpreted all ECGs.  None ____________________________________________  RADIOLOGY All Xrays were viewed by me. Imaging interpreted by Radiologist.  None __________________________________________  PROCEDURES  Procedure(s) performed: None  Critical Care performed: None  ____________________________________________   ED COURSE / ASSESSMENT AND PLAN  Pertinent labs & imaging results that were available during my care of the patient were reviewed by me and considered in my medical decision making (see chart for details).    Patient for evaluation for about 5 days of new skin rash pressure for it. She thinks it's a side effect of a new medication Trileptal. Although it's not up classic generalized or diffuse drug eruption, I also don't see any evidence for [employer].  In some ways it looks like a bit of an acne flare although again somewhat unusual distribution for that. I suspect is probably most likely drug eruption for her. She's going discontinue the Trileptal. She does not want take prednisone and I don't think it's really indicated today  given the rash is fairly minor and the symptoms are mostly controlled with Benadryl.  She did state that she felt like her lips were full, but they don't look for and she is certainly not having any tongue swelling throat swelling or trouble breathing. We discussed return precautions especially with respect to mucous membrane involvement.    CONSULTATIONS:   None  Patient / Family / Caregiver informed of clinical course, medical decision-making process, and agree with plan.   I discussed return precautions, follow-up instructions, and discharge instructions with patient and/or family.  Discharge instructions:  Although no certain cause was found for your skin rash, giving your history, it may be related to the new drug Trileptal. Please call your neurologist to discuss any change in medication. For now he may  discontinue.  Return to the emergency department immediately for any worsening redness, drainage, fever, or any trouble breathing, trouble swallowing, swelling of the tongue or mouth, or any blisters in the mouth or vaginal area. ___________________________________________   FINAL CLINICAL IMPRESSION(S) / ED DIAGNOSES   Final diagnoses:  Skin rash              Note: This dictation was prepared with Dragon dictation. Any transcriptional errors that result from this process are unintentional    Governor Rooksebecca Ezme Duch, MD 07/29/16 1221

## 2016-07-29 NOTE — ED Triage Notes (Addendum)
Pt reports to ED w/ c/o rash to face/head that started Tuesday and has gradually gotten worse . Pt sts that she took benedryl yesterday.  Pt believes she has allergy to trileptal, pt sts she's been taking x 2 weeks for seizures.  Pt denies CP, SOB, difficulty swallowing or LOC. Pt ambulatory to room w/o issue, resp even and unlabored, able to speak clearly w/o issue

## 2016-09-20 ENCOUNTER — Emergency Department: Payer: BLUE CROSS/BLUE SHIELD

## 2016-09-20 ENCOUNTER — Encounter: Payer: Self-pay | Admitting: Emergency Medicine

## 2016-09-20 ENCOUNTER — Emergency Department
Admission: EM | Admit: 2016-09-20 | Discharge: 2016-09-20 | Disposition: A | Discharge status: Home / Self Care | Payer: BLUE CROSS/BLUE SHIELD | Attending: Student in an Organized Health Care Education/Training Program | Admitting: Student in an Organized Health Care Education/Training Program

## 2016-09-20 DIAGNOSIS — R1013 Epigastric pain: Secondary | ICD-10-CM | POA: Insufficient documentation

## 2016-09-20 DIAGNOSIS — I10 Essential (primary) hypertension: Secondary | ICD-10-CM | POA: Diagnosis not present

## 2016-09-20 DIAGNOSIS — J45909 Unspecified asthma, uncomplicated: Secondary | ICD-10-CM | POA: Diagnosis not present

## 2016-09-20 DIAGNOSIS — F17290 Nicotine dependence, other tobacco product, uncomplicated: Secondary | ICD-10-CM | POA: Insufficient documentation

## 2016-09-20 LAB — COMPREHENSIVE METABOLIC PANEL
ALBUMIN: 4.1 g/dL (ref 3.5–5.0)
ALT: 14 U/L (ref 14–54)
AST: 24 U/L (ref 15–41)
Alkaline Phosphatase: 74 U/L (ref 38–126)
Anion gap: 9 (ref 5–15)
BUN: 11 mg/dL (ref 6–20)
CHLORIDE: 98 mmol/L — AB (ref 101–111)
CO2: 24 mmol/L (ref 22–32)
Calcium: 9.1 mg/dL (ref 8.9–10.3)
Creatinine, Ser: 0.81 mg/dL (ref 0.44–1.00)
GFR calc Af Amer: >60 mL/min (ref >60)
GLUCOSE: 99 mg/dL (ref 65–99)
POTASSIUM: 3.7 mmol/L (ref 3.5–5.1)
SODIUM: 131 mmol/L — AB (ref 135–145)
Total Bilirubin: 0.6 mg/dL (ref 0.3–1.2)
Total Protein: 7.7 g/dL (ref 6.5–8.1)

## 2016-09-20 LAB — PREGNANCY, URINE: PREG TEST UR: NEGATIVE

## 2016-09-20 LAB — CBC
HEMATOCRIT: 35.9 % (ref 35.0–47.0)
Hemoglobin: 12.3 g/dL (ref 12.0–16.0)
MCH: 30 pg (ref 26.0–34.0)
MCHC: 34.3 g/dL (ref 32.0–36.0)
MCV: 87.4 fL (ref 80.0–100.0)
Platelets: 268 10*3/uL (ref 150–440)
RBC: 4.11 MIL/uL (ref 3.80–5.20)
RDW: 13.5 % (ref 11.5–14.5)
WBC: 7.5 10*3/uL (ref 3.6–11.0)

## 2016-09-20 LAB — URINALYSIS, COMPLETE (UACMP) WITH MICROSCOPIC
BILIRUBIN URINE: NEGATIVE
Glucose, UA: NEGATIVE mg/dL
KETONES UR: NEGATIVE mg/dL
Nitrite: NEGATIVE
PROTEIN: 30 mg/dL — AB
Specific Gravity, Urine: 1.023 (ref 1.005–1.030)
pH: 5 (ref 5.0–8.0)

## 2016-09-20 LAB — LIPASE, BLOOD: LIPASE: 20 U/L (ref 11–51)

## 2016-09-20 MED ORDER — TRAMADOL HCL 50 MG PO TABS: 50.0000 mg | ORAL_TABLET | Freq: Four times a day (QID) | ORAL | 0 refills | Status: DC | PRN

## 2016-09-20 MED ORDER — SODIUM CHLORIDE 0.9 % IV BOLUS (SEPSIS)
1000.0000 mL | Freq: Once | INTRAVENOUS | Status: AC
  Administered 2016-09-20: 1000 mL via INTRAVENOUS

## 2016-09-20 MED ORDER — RANITIDINE HCL 150 MG PO TABS: 150.0000 mg | ORAL_TABLET | Freq: Two times a day (BID) | ORAL | 1 refills | Status: DC

## 2016-09-20 MED ORDER — IOPAMIDOL (ISOVUE-300) INJECTION 61%
100.0000 mL | Freq: Once | INTRAVENOUS | Status: AC | PRN
  Administered 2016-09-20: 100 mL via INTRAVENOUS
  Filled 2016-09-20: qty 100

## 2016-09-20 NOTE — ED Notes (Signed)
Pt discharged home after verbalizing understanding of discharge instructions; nad noted. 

## 2016-09-20 NOTE — ED Triage Notes (Signed)
Pt presents with epigastric/upper abdominal pain since January. She states she had hernia surgery in October and it started throbbing and hurting in January. When asked why she came in today and not last week, she states this is the first time she has had a break in her schedule to come in. Pain described as bloating and throbbing. Pt alert & oriented with NAD noted.

## 2016-09-20 NOTE — ED Provider Notes (Signed)
[facility] Emergency Department Provider Note    First MD Initiated Contact with Patient 09/20/16 1437     (approximate)  I have reviewed the triage vital signs and the nursing notes.   HISTORY  Chief Complaint Abdominal Pain    HPI Kristina Schultz is a 46 y.o. female presents with progressively worsening epigastric abdominal pain since January. States that she had ventral hernia repaired in the fall of last year. States the pain has been progressively worsening and is causing her to have decreased appetite due to severe pain every time she eats. The pain is sudden in onset immediately after eating. She is still moving her bowels but states that she may have had some constipation. No vomiting. No measured fevers. Has been straining to lift her mother and father as they're both disabled.  No pain radiating through to her back. No chest pain or shortness of breath.   Past Medical History:  Diagnosis Date  . Asthma   . Bipolar 1 disorder (HCC)   . Fatty liver   . Hyperlipemia   . Hypertension   . Seizures (HCC) 2017   Family History  Problem Relation Age of Onset  . Transient ischemic attack Father   . Breast cancer Sister    Past Surgical History:  Procedure Laterality Date  . CYST REMOVAL TRUNK    . VENTRAL HERNIA REPAIR  02/19/2016   Dr Evette Cristal  . VENTRAL HERNIA REPAIR N/A 02/19/2016   Procedure: incarcerated ventral hernia repair;  Surgeon: Kieth Brightly, MD;  Location: [facility] ORS;  Service: General;  Laterality: N/A;   Patient Active Problem List   Diagnosis Date Noted  . Incarcerated ventral hernia 02/19/2016      Prior to Admission medications   Medication Sig Start Date End Date Taking? Authorizing Provider  acetaminophen (TYLENOL) 325 MG tablet Take 2 tablets (650 mg total) by mouth every 6 (six) hours as needed for mild pain (or temp > 100). 02/19/16   Henrene Dodge, MD  albuterol (PROVENTIL HFA;VENTOLIN HFA) 108 (90 BASE) MCG/ACT  inhaler Inhale 2 puffs into the lungs every 6 (six) hours as needed for wheezing or shortness of breath. 03/06/15   Jene Every, MD  LamoTRIgine (LAMICTAL PO) Take 1 tablet by mouth daily.    Historical Provider, MD  levETIRAcetam (KEPPRA) 750 MG tablet Take 1 tablet (750 mg total) by mouth 2 (two) times daily. 04/26/16 05/10/16  Willy Eddy, MD  ranitidine (ZANTAC) 150 MG tablet Take 1 tablet (150 mg total) by mouth 2 (two) times daily. 09/20/16 09/20/17  Willy Eddy, MD  sertraline (ZOLOFT) 50 MG tablet Take 50 mg by mouth at bedtime.    Historical Provider, MD  traMADol (ULTRAM) 50 MG tablet Take 1 tablet (50 mg total) by mouth every 6 (six) hours as needed. 09/20/16 09/20/17  Willy Eddy, MD  triamcinolone ointment (KENALOG) 0.5 % Apply 1 application topically 2 (two) times daily. Patient taking differently: Apply 1 application topically 2 (two) times daily as needed (skin protection).  01/23/15   Chinita Pester, FNP    Allergies Atenolol; Lisinopril; Penicillins; Prednisone; Celexa [citalopram hydrobromide]; Lamictal [lamotrigine]; and Sulfa antibiotics    Social History Social History  Substance Use Topics  . Smoking status: Current Every Day Smoker    Packs/day: 0.25    Types: Cigars  . Smokeless tobacco: Never Used     Comment: 2 cigars per day  . Alcohol use No    Review of Systems Patient denies  headaches, rhinorrhea, blurry vision, numbness, shortness of breath, chest pain, edema, cough, abdominal pain, nausea, vomiting, diarrhea, dysuria, fevers, rashes or hallucinations unless otherwise stated above in HPI. ____________________________________________   PHYSICAL EXAM:  VITAL SIGNS: Vitals:   09/20/16 1319 09/20/16 1638  BP: 129/82 (!) 162/90  Pulse: 78 63  Resp: 18 18  Temp: 98.3 F (36.8 C)     Constitutional: Alert and oriented. Well appearing and in no acute distress. Eyes: Conjunctivae are normal. PERRL. EOMI. Head: Atraumatic. Nose: No  congestion/rhinnorhea. Mouth/Throat: Mucous membranes are moist.  Oropharynx non-erythematous. Neck: No stridor. Painless ROM. No cervical spine tenderness to palpation Hematological/Lymphatic/Immunilogical: No cervical lymphadenopathy. Cardiovascular: Normal rate, regular rhythm. Grossly normal heart sounds.  Good peripheral circulation. Respiratory: Normal respiratory effort.  No retractions. Lungs CTAB. Gastrointestinal: Soft , mild ttp in epigastric region.  No hernia appreciable but limited 2/2 obesity. No distention. No abdominal bruits. No CVA tenderness. Musculoskeletal: No lower extremity tenderness nor edema.  No joint effusions. Neurologic:  Normal speech and language. No gross focal neurologic deficits are appreciated. No gait instability. Skin:  Skin is warm, dry and intact. No rash noted. Psychiatric: Mood and affect are normal. Speech and behavior are normal.  ____________________________________________   LABS (all labs ordered are listed, but only abnormal results are displayed)  Results for orders placed or performed during the hospital encounter of 09/20/16 (from the past 24 hour(s))  Lipase, blood     Status: None   Collection Time: 09/20/16  1:22 PM  Result Value Ref Range   Lipase 20 11 - 51 U/L  Comprehensive metabolic panel     Status: Abnormal   Collection Time: 09/20/16  1:22 PM  Result Value Ref Range   Sodium 131 (L) 135 - 145 mmol/L   Potassium 3.7 3.5 - 5.1 mmol/L   Chloride 98 (L) 101 - 111 mmol/L   CO2 24 22 - 32 mmol/L   Glucose, Bld 99 65 - 99 mg/dL   BUN 11 6 - 20 mg/dL   Creatinine, Ser 1.61 0.44 - 1.00 mg/dL   Calcium 9.1 8.9 - 09.6 mg/dL   Total Protein 7.7 6.5 - 8.1 g/dL   Albumin 4.1 3.5 - 5.0 g/dL   AST 24 15 - 41 U/L   ALT 14 14 - 54 U/L   Alkaline Phosphatase 74 38 - 126 U/L   Total Bilirubin 0.6 0.3 - 1.2 mg/dL   GFR calc non Af Amer >60 >60 mL/min   GFR calc Af Amer >60 >60 mL/min   Anion gap 9 5 - 15  CBC     Status: None    Collection Time: 09/20/16  1:22 PM  Result Value Ref Range   WBC 7.5 3.6 - 11.0 K/uL   RBC 4.11 3.80 - 5.20 MIL/uL   Hemoglobin 12.3 12.0 - 16.0 g/dL   HCT 04.5 40.9 - 81.1 %   MCV 87.4 80.0 - 100.0 fL   MCH 30.0 26.0 - 34.0 pg   MCHC 34.3 32.0 - 36.0 g/dL   RDW 91.4 78.2 - 95.6 %   Platelets 268 150 - 440 K/uL  Urinalysis, Complete w Microscopic     Status: Abnormal   Collection Time: 09/20/16  1:22 PM  Result Value Ref Range   Color, Urine YELLOW (A) YELLOW   APPearance HAZY (A) CLEAR   Specific Gravity, Urine 1.023 1.005 - 1.030   pH 5.0 5.0 - 8.0   Glucose, UA NEGATIVE NEGATIVE mg/dL   Hgb urine dipstick SMALL (  A) NEGATIVE   Bilirubin Urine NEGATIVE NEGATIVE   Ketones, ur NEGATIVE NEGATIVE mg/dL   Protein, ur 30 (A) NEGATIVE mg/dL   Nitrite NEGATIVE NEGATIVE   Leukocytes, UA TRACE (A) NEGATIVE   RBC / HPF 6-30 0 - 5 RBC/hpf   WBC, UA 0-5 0 - 5 WBC/hpf   Bacteria, UA FEW (A) NONE SEEN   Squamous Epithelial / LPF 6-30 (A) NONE SEEN   Mucous PRESENT    Hyaline Casts, UA PRESENT   Pregnancy, urine     Status: None   Collection Time: 09/20/16  1:22 PM  Result Value Ref Range   Preg Test, Ur NEGATIVE NEGATIVE   ____________________________________________ ________________________________  RADIOLOGY  I personally reviewed all radiographic images ordered to evaluate for the above acute complaints and reviewed radiology reports and findings.  These findings were personally discussed with the patient.  Please see medical record for radiology report.  ____________________________________________   PROCEDURES  Procedure(s) performed:  Procedures    Critical Care performed: no ____________________________________________   INITIAL IMPRESSION / ASSESSMENT AND PLAN / ED COURSE  Pertinent labs & imaging results that were available during my care of the patient were reviewed by me and considered in my medical decision making (see chart for details).  DDX: hernia,  gastritis, pud, scar tissue  Kristina Schultz is a 46 y.o. who presents to the ED With chief complaint of epigastric pain as described above.  Patient is AFVSS in ED. Exam as above. Given current presentation have considered the above differential. Symptoms have been ongoing for several weeks. Blood work is otherwise reassuring. CT imaging ordered to evaluate for acute abnormality shows evidence of slightly enlarging fat containing ventral hernia. No evidence of obstruction.  Do feel that this explains her discomfort but given her early satiety will start the patient on Zantac for component of gastritis. Patient will be given referral for outpatient follow-up and instructed to follow-up with her surgeon.  Patient was able to tolerate PO and was able to ambulate with a steady gait.    ____________________________________________   FINAL CLINICAL IMPRESSION(S) / ED DIAGNOSES  Final diagnoses:  Epigastric pain      NEW MEDICATIONS STARTED DURING THIS VISIT:  New Prescriptions   RANITIDINE (ZANTAC) 150 MG TABLET    Take 1 tablet (150 mg total) by mouth 2 (two) times daily.   TRAMADOL (ULTRAM) 50 MG TABLET    Take 1 tablet (50 mg total) by mouth every 6 (six) hours as needed.     Note:  This document was prepared using Dragon voice recognition software and may include unintentional dictation errors.    Willy Eddy, MD 09/20/16 [phone]

## 2016-09-20 NOTE — Discharge Instructions (Signed)

## 2016-09-24 ENCOUNTER — Telehealth: Payer: Self-pay | Admitting: *Deleted

## 2016-09-24 NOTE — Telephone Encounter (Signed)
Message left for patient to call the office.   Patient was seen in the E.R. on 09-20-16. This patient needs an appointment with Dr. Evette Cristal regarding epigastric pain and ventral hernia.   Patient saw Dr. Evette Cristal last on 02-19-16.   Patient had labs and a CT abdomen/pelvis at [facility] on 09-20-16 at  [facility].

## 2016-09-24 NOTE — Telephone Encounter (Signed)
Appointment scheduled for 09-26-16.

## 2016-09-26 ENCOUNTER — Ambulatory Visit (INDEPENDENT_AMBULATORY_CARE_PROVIDER_SITE_OTHER): Payer: BLUE CROSS/BLUE SHIELD | Admitting: General Surgery

## 2016-09-26 ENCOUNTER — Encounter: Payer: Self-pay | Admitting: General Surgery

## 2016-09-26 VITALS — BP 160/94 | HR 82 | Resp 16 | Ht 72.0 in | Wt 257.0 lb

## 2016-09-26 DIAGNOSIS — R1013 Epigastric pain: Secondary | ICD-10-CM

## 2016-09-26 DIAGNOSIS — K432 Incisional hernia without obstruction or gangrene: Secondary | ICD-10-CM | POA: Diagnosis not present

## 2016-09-26 MED ORDER — PANTOPRAZOLE SODIUM 40 MG PO TBEC: 40.0000 mg | DELAYED_RELEASE_TABLET | Freq: Every day | ORAL | 0 refills | Status: DC

## 2016-09-26 NOTE — Progress Notes (Signed)
Patient ID: Kristina Schultz, female   DOB: Feb 15, 1971, 46 y.o.   MRN: [id number]  Chief Complaint  Patient presents with  . Abdominal Pain    seen in ED 09-20-16    HPI Kristina Schultz is a 46 y.o. female. Here today for abdominal pain evaluation. She was seen on the ED on 09-20-16. The pain is epigastric, starts after she eats and lasts 45 minutes or longer. Increased belching. She states it has gotten worse over the past 2 months. Not associated with particular foods. Taking Zantac but says it's not helping. Patient also reports she can feel a knot near the hernia repair site when she stands. Incarcerated ventral hernia was 02-19-16.   HPI  Past Medical History:  Diagnosis Date  . Asthma   . Bipolar 1 disorder (HCC)   . Fatty liver   . Hyperlipemia   . Hypertension   . Seizures (HCC) 2017    Past Surgical History:  Procedure Laterality Date  . CYST REMOVAL TRUNK    . VENTRAL HERNIA REPAIR  02/19/2016   Dr Evette Cristal  . VENTRAL HERNIA REPAIR N/A 02/19/2016   Procedure: incarcerated ventral hernia repair;  Surgeon: Kieth Brightly, MD;  Location: [facility] ORS;  Service: General;  Laterality: N/A;    Family History  Problem Relation Age of Onset  . Transient ischemic attack Father   . Breast cancer Sister     Social History Social History  Substance Use Topics  . Smoking status: Current Every Day Smoker    Packs/day: 0.25    Types: Cigars  . Smokeless tobacco: Never Used     Comment: 2 cigars per day  . Alcohol use No    Allergies  Allergen Reactions  . Atenolol Anaphylaxis  . Lisinopril Other (See Comments)    Pancreatitis   . Penicillins Swelling    Swelling/rash  . Prednisone Other (See Comments)    Dizzy and nausea  . Celexa [Citalopram Hydrobromide] Rash    Nosebleed/rash  . Lamictal [Lamotrigine] Rash    rash  . Sulfa Antibiotics Rash    rash    Current Outpatient Prescriptions  Medication Sig Dispense Refill  . acetaminophen (TYLENOL) 325 MG tablet Take  2 tablets (650 mg total) by mouth every 6 (six) hours as needed for mild pain (or temp > 100). 40 tablet 0  . albuterol (PROVENTIL HFA;VENTOLIN HFA) 108 (90 BASE) MCG/ACT inhaler Inhale 2 puffs into the lungs every 6 (six) hours as needed for wheezing or shortness of breath. 1 Inhaler 2  . levETIRAcetam (KEPPRA) 750 MG tablet Take 1 tablet (750 mg total) by mouth 2 (two) times daily. (Patient taking differently: Take 375 mg by mouth 2 (two) times daily. ) 28 tablet 0  . sertraline (ZOLOFT) 50 MG tablet Take 50 mg by mouth at bedtime.    . triamcinolone ointment (KENALOG) 0.5 % Apply 1 application topically 2 (two) times daily. (Patient taking differently: Apply 1 application topically 2 (two) times daily as needed (skin protection). ) 30 g 0  . pantoprazole (PROTONIX) 40 MG tablet Take 1 tablet (40 mg total) by mouth daily. 30 tablet 0   No current facility-administered medications for this visit.     Review of Systems Review of Systems  Constitutional: Negative.   Respiratory: Negative.   Cardiovascular: Negative.   Gastrointestinal: Positive for abdominal pain and constipation. Negative for diarrhea, nausea and vomiting.    Blood pressure (!) 160/94, pulse 82, resp. rate 16, height 6' (1.829 m),  weight 257 lb (116.6 kg), last menstrual period 09/13/2016.  Physical Exam Physical Exam  Constitutional: She is oriented to person, place, and time. She appears well-developed and well-nourished.  Eyes: Conjunctivae are normal. No scleral icterus.  Neck: Neck supple.  Cardiovascular: Normal rate, regular rhythm and normal heart sounds.   Pulmonary/Chest: Effort normal and breath sounds normal.  Abdominal: Soft. Bowel sounds are normal. A hernia is present. Hernia confirmed positive in the ventral area.    Lymphadenopathy:    She has no cervical adenopathy.  Neurological: She is alert and oriented to person, place, and time.  Skin: Skin is warm and dry.  Psychiatric: Her behavior is  normal.    Data Reviewed Prior notes reviewed CT scan from 09/19/16 - gallbladder normal. Recurrent fat-containing ventral hernia measuring 2.9 cm. Moderate colonic diverticulosis without evidence for acute diverticulitis.   Assessment    Epigastric pain, possibly due to dyspepsia or gastric ulcer, based on patient's description and pattern of pain. Though gallbladder was normal on CT, could still be a factor here. If trial of Protonix does not resolve symptoms, will do ultrasound to evaluate gallbladder and also an endoscopy Recurrent fat-containing ventral hernia, 2.9 cm on CT scan and palpable. Originally an incarcerated ventral hernia, repaired 02/19/2016 with Prolene suturing instead of mesh, due to inflamed fatty tissue in the hernia This does not appear to be part of the patient's epigastric pain, but we discussed possible laparoscopic repair in the future.     Plan   Protonix 40 mg one daily. Follow up in 2 weeks.     HPI, Physical Exam, Assessment and Plan have been scribed under the direction and in the presence of Kathreen Cosier, MD  Dorathy Daft, RN  I have completed the exam and reviewed the above documentation for accuracy and completeness.  I agree with the above.  Museum/gallery conservator has been used and any errors in dictation or transcription are unintentional.  Leonce Bale G. Evette Cristal, M.D., F.A.C.S.  Gerlene Burdock G 09/26/2016, 1:07 PM

## 2016-09-26 NOTE — Patient Instructions (Signed)
The patient is aware to call back for any questions or concerns.  

## 2016-10-01 ENCOUNTER — Telehealth: Payer: Self-pay

## 2016-10-01 NOTE — Telephone Encounter (Signed)
Patient called with concerns about taking her Protonix. She states that on the 3rd day of taking this medication she developed some shortness of breath and mild chest pain. Yesterday she had lightheadedness and felt horse in her throat. She feels like she got hit by a truck. She states that today she has not taken her Protonix due to the possibility of side effects. She would like to know what Dr Evette CristalSankar wants her to do.

## 2016-10-10 ENCOUNTER — Encounter: Payer: Self-pay | Admitting: General Surgery

## 2016-10-10 ENCOUNTER — Ambulatory Visit (INDEPENDENT_AMBULATORY_CARE_PROVIDER_SITE_OTHER): Payer: BLUE CROSS/BLUE SHIELD | Admitting: General Surgery

## 2016-10-10 VITALS — BP 146/84 | Ht 72.0 in | Wt 257.0 lb

## 2016-10-10 DIAGNOSIS — R1013 Epigastric pain: Secondary | ICD-10-CM

## 2016-10-10 DIAGNOSIS — K432 Incisional hernia without obstruction or gangrene: Secondary | ICD-10-CM

## 2016-10-10 NOTE — Progress Notes (Signed)
Patient ID: Kristina Schultz, female   DOB: 10/16/1970, 46 y.o.   MRN: [id number]  Chief Complaint  Patient presents with  . Other    epigastric pain    HPI Kristina Lauthnita R Kalil is a 46 y.o. female is here today for a 2 week epigastric pain follow up. Patient states she has not been taking the Protonix. She did take it for 3 days and then she stopped due to developing extreme hoarseness. She describes the epigastric pain as improved some. She has been feeling bloating all the time and worse after she eats. She feels full all the time which she has decreased appetite. She is moving her bowels, occasional diarrhea right after she eats.  She states her chest pain has improved. She is in the process of finding a new primary care doctor.   HPI  Past Medical History:  Diagnosis Date  . Asthma   . Bipolar 1 disorder (HCC)   . Fatty liver   . Hyperlipemia   . Hypertension   . Seizures (HCC) 2017    Past Surgical History:  Procedure Laterality Date  . CYST REMOVAL TRUNK    . VENTRAL HERNIA REPAIR  02/19/2016   Dr Evette CristalSankar  . VENTRAL HERNIA REPAIR N/A 02/19/2016   Procedure: incarcerated ventral hernia repair;  Surgeon: Kieth BrightlySeeplaputhur G Sankar, MD;  Location: [facility] ORS;  Service: General;  Laterality: N/A;    Family History  Problem Relation Age of Onset  . Transient ischemic attack Father   . Breast cancer Sister     Social History Social History  Substance Use Topics  . Smoking status: Current Every Day Smoker    Packs/day: 0.25    Types: Cigars  . Smokeless tobacco: Never Used     Comment: 2 cigars per day  . Alcohol use No    Allergies  Allergen Reactions  . Atenolol Anaphylaxis  . Lisinopril Other (See Comments)    Pancreatitis   . Penicillins Swelling    Swelling/rash  . Prednisone Other (See Comments)    Dizzy and nausea  . Celexa [Citalopram Hydrobromide] Rash    Nosebleed/rash  . Lamictal [Lamotrigine] Rash    rash  . Sulfa Antibiotics Rash    rash    Current  Outpatient Prescriptions  Medication Sig Dispense Refill  . acetaminophen (TYLENOL) 325 MG tablet Take 2 tablets (650 mg total) by mouth every 6 (six) hours as needed for mild pain (or temp > 100). 40 tablet 0  . albuterol (PROVENTIL HFA;VENTOLIN HFA) 108 (90 BASE) MCG/ACT inhaler Inhale 2 puffs into the lungs every 6 (six) hours as needed for wheezing or shortness of breath. 1 Inhaler 2  . levETIRAcetam (KEPPRA) 750 MG tablet Take 1 tablet (750 mg total) by mouth 2 (two) times daily. (Patient taking differently: Take 375 mg by mouth 2 (two) times daily. ) 28 tablet 0  . sertraline (ZOLOFT) 50 MG tablet Take 50 mg by mouth at bedtime.    . triamcinolone ointment (KENALOG) 0.5 % Apply 1 application topically 2 (two) times daily. (Patient taking differently: Apply 1 application topically 2 (two) times daily as needed (skin protection). ) 30 g 0  . pantoprazole (PROTONIX) 40 MG tablet Take 1 tablet (40 mg total) by mouth daily. (Patient not taking: Reported on 10/10/2016) 30 tablet 0   No current facility-administered medications for this visit.     Review of Systems Review of Systems  Constitutional: Positive for appetite change (decreased).  Respiratory: Negative.   Cardiovascular: Negative.  Gastrointestinal: Positive for abdominal distention.    Blood pressure (!) 146/84, height 6' (1.829 m), weight 257 lb (116.6 kg), last menstrual period 09/13/2016.  Physical Exam Physical Exam  Constitutional: She is oriented to person, place, and time. She appears well-developed and well-nourished.  Eyes: Conjunctivae are normal. No scleral icterus.  Neck: Neck supple.  Cardiovascular: Normal rate, regular rhythm and normal heart sounds.   Pulmonary/Chest: Effort normal. She has wheezes (bilateral).  Abdominal: Soft. Bowel sounds are normal.  Neurological: She is alert and oriented to person, place, and time.  Skin: Skin is warm and dry.    Data Reviewed Prior notes and Imaging  studies  Assessment    Epigastric pain Recurrent Ventral Hernia   Pain character suggests gastric source. GB was normal on [place] last yr and on recent CT Plan      Advised patient need for upper endoscopy. Explained risks and benefits. Patient agrees to proceed with procedure.   Subsequent repair of ventral hernia  HPI, Physical Exam, Assessment and Plan have been scribed under the direction and in the presence of Kathreen Cosier, MD.  Milas Kocher, CMA   I have completed the exam and reviewed the above documentation for accuracy and completeness.  I agree with the above.  Museum/gallery conservator has been used and any errors in dictation or transcription are unintentional.  Seeplaputhur G. Evette Cristal, M.D., F.A.C.S.         Gerlene Burdock G 10/10/2016, 7:25 PM   Patient has been scheduled for an upper endoscopy on 10-17-16 at [facility]. Upper endoscopy instructions have been reviewed with the patient. This patient is aware to call the office if they have further questions.   Nicholes Mango, CMA

## 2016-10-10 NOTE — Patient Instructions (Addendum)
Upper Endoscopy Upper endoscopy is a procedure to look inside the upper GI (gastrointestinal) tract. The upper GI tract is made up of:  The tube that carries food and liquid from your throat to your stomach (esophagus).  The stomach.  The first part of your small intestine (duodenum).  This procedure is also called esophagogastroduodenoscopy (EGD) or gastroscopy. In this procedure, your health care provider passes a thin, flexible tube (endoscope) through your mouth and down your esophagus into your stomach. A small camera is attached to the end of the tube. Images from the camera appear on a monitor in the exam room. During this procedure, your health care provider may also remove a small piece of tissue to be sent to a lab and examined under a microscope (biopsy). Your health care provider may do an upper endoscopy to diagnose cancers of the upper GI tract. You may also have this procedure to find the cause of other conditions, such as:  Stomach pain.  Heartburn.  Pain or problems when swallowing.  Nausea and vomiting.  Stomach bleeding.  Stomach ulcers.  Tell a health care provider about:  Any allergies you have.  All medicines you are taking, including vitamins, herbs, eye drops, creams, and over-the-counter medicines.  Any problems you or family members have had with anesthetic medicines.  Any blood disorders you have.  Any surgeries you have had.  Any medical conditions you have.  Whether you are pregnant or may be pregnant. What are the risks? Generally, this is a safe procedure. However, problems may occur, including:  Infection.  Bleeding.  Allergic reactions to medicines.  A tear or hole (perforation) in the esophagus, stomach, or duodenum.  What happens before the procedure?  Follow instructions from your health care provider about eating or drinking restrictions.  Ask your health care provider about changing or stopping your regular medicines. This is  especially important if you are taking diabetes medicines or blood thinners.  Plan to have someone take you home after the procedure.  If you go home right after the procedure, plan to have someone with you for 24 hours. What happens during the procedure?  An IV tube will be inserted into one of your veins.  Your throat may be sprayed with medicine that numbs the area (local anesthetic).  You may be given a medicine to help you relax (sedative).  You will lie on your left side.  Your health care provider will pass the endoscope through your mouth and down your esophagus.  Your provider will use the scope to check the inside of your esophagus, stomach, and duodenum. Biopsies may be taken. The procedure may vary among health care providers and hospitals. What happens after the procedure?  Do not drive for 24 hours if you received a sedative.  Your blood pressure, heart rate, breathing rate, and blood oxygen level will be monitored often until the medicines you were given have worn off.  When your throat is no longer numb, you may be given some fluids to drink.  It is your responsibility to get the results of your procedure. Ask your health care provider or the department performing the procedure when your results will be ready. This information is not intended to replace advice given to you by your health care provider. Make sure you discuss any questions you have with your health care provider. Document Released: 05/11/2000 Document Revised: 10/25/2015 Document Reviewed: 02/24/2015 Elsevier Interactive Patient Education  2017 [employer]  

## 2016-10-17 ENCOUNTER — Ambulatory Visit: Payer: BLUE CROSS/BLUE SHIELD | Admitting: Certified Registered"

## 2016-10-17 ENCOUNTER — Ambulatory Visit
Admission: RE | Admit: 2016-10-17 | Discharge: 2016-10-17 | Disposition: A | Discharge status: Home / Self Care | Payer: BLUE CROSS/BLUE SHIELD | Source: Ambulatory Visit | Attending: General Surgery | Admitting: General Surgery

## 2016-10-17 ENCOUNTER — Encounter
Admission: RE | Disposition: A | Discharge status: Home / Self Care | Payer: Self-pay | Source: Ambulatory Visit | Attending: General Surgery

## 2016-10-17 ENCOUNTER — Encounter: Payer: Self-pay | Admitting: *Deleted

## 2016-10-17 DIAGNOSIS — F319 Bipolar disorder, unspecified: Secondary | ICD-10-CM | POA: Diagnosis not present

## 2016-10-17 DIAGNOSIS — I10 Essential (primary) hypertension: Secondary | ICD-10-CM | POA: Diagnosis not present

## 2016-10-17 DIAGNOSIS — R1013 Epigastric pain: Secondary | ICD-10-CM | POA: Insufficient documentation

## 2016-10-17 DIAGNOSIS — Z8249 Family history of ischemic heart disease and other diseases of the circulatory system: Secondary | ICD-10-CM | POA: Diagnosis not present

## 2016-10-17 DIAGNOSIS — J45909 Unspecified asthma, uncomplicated: Secondary | ICD-10-CM | POA: Insufficient documentation

## 2016-10-17 DIAGNOSIS — K30 Functional dyspepsia: Secondary | ICD-10-CM | POA: Diagnosis not present

## 2016-10-17 DIAGNOSIS — R569 Unspecified convulsions: Secondary | ICD-10-CM | POA: Insufficient documentation

## 2016-10-17 DIAGNOSIS — E785 Hyperlipidemia, unspecified: Secondary | ICD-10-CM | POA: Insufficient documentation

## 2016-10-17 DIAGNOSIS — F17290 Nicotine dependence, other tobacco product, uncomplicated: Secondary | ICD-10-CM | POA: Insufficient documentation

## 2016-10-17 DIAGNOSIS — Z79899 Other long term (current) drug therapy: Secondary | ICD-10-CM | POA: Insufficient documentation

## 2016-10-17 DIAGNOSIS — Z803 Family history of malignant neoplasm of breast: Secondary | ICD-10-CM | POA: Insufficient documentation

## 2016-10-17 DIAGNOSIS — K2970 Gastritis, unspecified, without bleeding: Secondary | ICD-10-CM | POA: Diagnosis not present

## 2016-10-17 DIAGNOSIS — K2950 Unspecified chronic gastritis without bleeding: Secondary | ICD-10-CM | POA: Insufficient documentation

## 2016-10-17 DIAGNOSIS — Z888 Allergy status to other drugs, medicaments and biological substances status: Secondary | ICD-10-CM | POA: Diagnosis not present

## 2016-10-17 DIAGNOSIS — Z880 Allergy status to penicillin: Secondary | ICD-10-CM | POA: Insufficient documentation

## 2016-10-17 DIAGNOSIS — Z882 Allergy status to sulfonamides status: Secondary | ICD-10-CM | POA: Diagnosis not present

## 2016-10-17 HISTORY — PX: ESOPHAGOGASTRODUODENOSCOPY (EGD) WITH PROPOFOL: SHX5813

## 2016-10-17 LAB — POCT PREGNANCY, URINE: Preg Test, Ur: NEGATIVE

## 2016-10-17 SURGERY — ESOPHAGOGASTRODUODENOSCOPY (EGD) WITH PROPOFOL
Anesthesia: General

## 2016-10-17 MED ORDER — PROPOFOL 500 MG/50ML IV EMUL
INTRAVENOUS | Status: AC
  Filled 2016-10-17: qty 50

## 2016-10-17 MED ORDER — LIDOCAINE HCL (CARDIAC) 20 MG/ML IV SOLN
INTRAVENOUS | Status: DC | PRN
  Administered 2016-10-17: 100 mg via INTRAVENOUS

## 2016-10-17 MED ORDER — PROPOFOL 500 MG/50ML IV EMUL
INTRAVENOUS | Status: DC | PRN
  Administered 2016-10-17: 150 ug/kg/min via INTRAVENOUS

## 2016-10-17 MED ORDER — SODIUM CHLORIDE 0.9 % IV SOLN
INTRAVENOUS | Status: DC | PRN
  Administered 2016-10-17: 10:00:00 via INTRAVENOUS

## 2016-10-17 MED ORDER — GLYCOPYRROLATE 0.2 MG/ML IJ SOLN
INTRAMUSCULAR | Status: DC | PRN
  Administered 2016-10-17: 0.2 mg via INTRAVENOUS

## 2016-10-17 MED ORDER — PROPOFOL 10 MG/ML IV BOLUS
INTRAVENOUS | Status: DC | PRN
  Administered 2016-10-17: 100 mg via INTRAVENOUS

## 2016-10-17 NOTE — H&P (View-Only) (Signed)
Patient ID: Kristina Schultz, female   DOB: 10/16/1970, 46 y.o.   MRN: [id number]  Chief Complaint  Patient presents with  . Other    epigastric pain    HPI Kristina Schultz is a 46 y.o. female is here today for a 2 week epigastric pain follow up. Patient states she has not been taking the Protonix. She did take it for 3 days and then she stopped due to developing extreme hoarseness. She describes the epigastric pain as improved some. She has been feeling bloating all the time and worse after she eats. She feels full all the time which she has decreased appetite. She is moving her bowels, occasional diarrhea right after she eats.  She states her chest pain has improved. She is in the process of finding a new primary care doctor.   HPI  Past Medical History:  Diagnosis Date  . Asthma   . Bipolar 1 disorder (HCC)   . Fatty liver   . Hyperlipemia   . Hypertension   . Seizures (HCC) 2017    Past Surgical History:  Procedure Laterality Date  . CYST REMOVAL TRUNK    . VENTRAL HERNIA REPAIR  02/19/2016   Dr Evette CristalSankar  . VENTRAL HERNIA REPAIR N/A 02/19/2016   Procedure: incarcerated ventral hernia repair;  Surgeon: Kieth BrightlySeeplaputhur G Sankar, MD;  Location: [facility] ORS;  Service: General;  Laterality: N/A;    Family History  Problem Relation Age of Onset  . Transient ischemic attack Father   . Breast cancer Sister     Social History Social History  Substance Use Topics  . Smoking status: Current Every Day Smoker    Packs/day: 0.25    Types: Cigars  . Smokeless tobacco: Never Used     Comment: 2 cigars per day  . Alcohol use No    Allergies  Allergen Reactions  . Atenolol Anaphylaxis  . Lisinopril Other (See Comments)    Pancreatitis   . Penicillins Swelling    Swelling/rash  . Prednisone Other (See Comments)    Dizzy and nausea  . Celexa [Citalopram Hydrobromide] Rash    Nosebleed/rash  . Lamictal [Lamotrigine] Rash    rash  . Sulfa Antibiotics Rash    rash    Current  Outpatient Prescriptions  Medication Sig Dispense Refill  . acetaminophen (TYLENOL) 325 MG tablet Take 2 tablets (650 mg total) by mouth every 6 (six) hours as needed for mild pain (or temp > 100). 40 tablet 0  . albuterol (PROVENTIL HFA;VENTOLIN HFA) 108 (90 BASE) MCG/ACT inhaler Inhale 2 puffs into the lungs every 6 (six) hours as needed for wheezing or shortness of breath. 1 Inhaler 2  . levETIRAcetam (KEPPRA) 750 MG tablet Take 1 tablet (750 mg total) by mouth 2 (two) times daily. (Patient taking differently: Take 375 mg by mouth 2 (two) times daily. ) 28 tablet 0  . sertraline (ZOLOFT) 50 MG tablet Take 50 mg by mouth at bedtime.    . triamcinolone ointment (KENALOG) 0.5 % Apply 1 application topically 2 (two) times daily. (Patient taking differently: Apply 1 application topically 2 (two) times daily as needed (skin protection). ) 30 g 0  . pantoprazole (PROTONIX) 40 MG tablet Take 1 tablet (40 mg total) by mouth daily. (Patient not taking: Reported on 10/10/2016) 30 tablet 0   No current facility-administered medications for this visit.     Review of Systems Review of Systems  Constitutional: Positive for appetite change (decreased).  Respiratory: Negative.   Cardiovascular: Negative.  Gastrointestinal: Positive for abdominal distention.    Blood pressure (!) 146/84, height 6' (1.829 m), weight 257 lb (116.6 kg), last menstrual period 09/13/2016.  Physical Exam Physical Exam  Constitutional: She is oriented to person, place, and time. She appears well-developed and well-nourished.  Eyes: Conjunctivae are normal. No scleral icterus.  Neck: Neck supple.  Cardiovascular: Normal rate, regular rhythm and normal heart sounds.   Pulmonary/Chest: Effort normal. She has wheezes (bilateral).  Abdominal: Soft. Bowel sounds are normal.  Neurological: She is alert and oriented to person, place, and time.  Skin: Skin is warm and dry.    Data Reviewed Prior notes and Imaging  studies  Assessment    Epigastric pain Recurrent Ventral Hernia   Pain character suggests gastric source. GB was normal on [place] last yr and on recent CT Plan      Advised patient need for upper endoscopy. Explained risks and benefits. Patient agrees to proceed with procedure.   Subsequent repair of ventral hernia  HPI, Physical Exam, Assessment and Plan have been scribed under the direction and in the presence of Kathreen Cosier, MD.  Milas Kocher, CMA   I have completed the exam and reviewed the above documentation for accuracy and completeness.  I agree with the above.  Museum/gallery conservator has been used and any errors in dictation or transcription are unintentional.  Seeplaputhur G. Evette Cristal, M.D., F.A.C.S.         Gerlene Burdock G 10/10/2016, 7:25 PM   Patient has been scheduled for an upper endoscopy on 10-17-16 at [facility]. Upper endoscopy instructions have been reviewed with the patient. This patient is aware to call the office if they have further questions.   Nicholes Mango, CMA

## 2016-10-17 NOTE — Transfer of Care (Signed)
Immediate Anesthesia Transfer of Care Note  Patient: Kristina Schultz  Procedure(s) Performed: Procedure(s): ESOPHAGOGASTRODUODENOSCOPY (EGD) WITH PROPOFOL (N/A)  Patient Location: PACU  Anesthesia Type:General  Level of Consciousness: sedated and responds to stimulation  Airway & Oxygen Therapy: Patient Spontanous Breathing and Patient connected to nasal cannula oxygen  Post-op Assessment: Report given to RN and Post -op Vital signs reviewed and stable  Post vital signs: Reviewed and stable  Last Vitals:  Vitals:   10/17/16 0904 10/17/16 0958  BP: (!) 146/93 128/87  Pulse: 72 81  Resp: 20 13  Temp: 36.6 C     Last Pain:  Vitals:   10/17/16 0904  TempSrc: Tympanic      Patients Stated Pain Goal: 0 (10/17/16 0904)  Complications: No apparent anesthesia complications

## 2016-10-17 NOTE — Op Note (Signed)
[facility] Gastroenterology Patient Name: Kristina Schultz Procedure Date: 10/17/2016 9:15 AM MRN: [id number] Account #: [id number] Date of Birth: 09-Nov-1970 Admit Type: Outpatient Age: 46 Room: [facility] ENDO ROOM 1 Gender: Female Note Status: Finalized Procedure:            Upper GI endoscopy Indications:          Epigastric abdominal pain, Dyspepsia, Indigestion Providers:            Seeplaputhur G. Evette Cristal, MD Referring MD:         Karrie Doffing, MD (Referring MD) Medicines:            Total IV Anesthesia (TIVA) Complications:        No immediate complications. Procedure:            Pre-Anesthesia Assessment:                       - General anesthesia under the supervision of an                        anesthesiologist was determined to be medically                        necessary for this procedure based on review of the                        patient's medical history, medications, and prior                        anesthesia history.                       After obtaining informed consent, the endoscope was                        passed under direct vision. Throughout the procedure,                        the patient's blood pressure, pulse, and oxygen                        saturations were monitored continuously. The Endoscope                        was introduced through the mouth, and advanced to the                        second part of duodenum. The upper GI endoscopy was                        accomplished without difficulty. The patient tolerated                        the procedure fairly well. Findings:      The examined duodenum was normal.      The esophagus was normal.      Patchy moderate inflammation characterized by erythema and granularity       was found in the gastric antrum. Biopsies were taken with a cold forceps       for histology.      The exam was otherwise without abnormality. Impression:           -  Normal examined duodenum.            - Normal esophagus.                       - Gastritis. Biopsied.                       - The examination was otherwise normal. Recommendation:       - Await pathology results. Procedure Code(s):    --- Professional ---                       [phone], Esophagogastroduodenoscopy, flexible, transoral;                        with biopsy, single or multiple Diagnosis Code(s):    --- Professional ---                       K29.70, Gastritis, unspecified, without bleeding                       R10.13, Epigastric pain                       K30, Functional dyspepsia CPT copyright 2016 American Medical Association. All rights reserved. The codes documented in this report are preliminary and upon coder review may  be revised to meet current compliance requirements. Kieth BrightlySeeplaputhur G Sankar, MD 10/17/2016 9:55:59 AM This report has been signed electronically. Number of Addenda: 0 Note Initiated On: 10/17/2016 9:15 AM      [facility]

## 2016-10-17 NOTE — Anesthesia Post-op Follow-up Note (Cosign Needed)
Anesthesia QCDR form completed.        

## 2016-10-17 NOTE — Anesthesia Preprocedure Evaluation (Signed)
Anesthesia Evaluation  Patient identified by MRN, date of birth, ID band Patient awake    Reviewed: Allergy & Precautions, H&P , NPO status , Patient's Chart, lab work & pertinent test results, reviewed documented beta blocker date and time   History of Anesthesia Complications Negative for: history of anesthetic complications  Airway Mallampati: I  TM Distance: >3 FB Neck ROM: full    Dental  (+) Teeth Intact, Dental Advidsory Given   Pulmonary neg shortness of breath, asthma (with colds) , neg sleep apnea, neg COPD, Recent URI , Residual Cough, Current Smoker,           Cardiovascular Exercise Tolerance: Good hypertension, (-) angina(-) CAD, (-) Past MI, (-) Cardiac Stents and (-) CABG (-) dysrhythmias (-) Valvular Problems/Murmurs     Neuro/Psych Seizures -, Well Controlled,  PSYCHIATRIC DISORDERS (Bipolar)    GI/Hepatic GERD  ,NAFLD   Endo/Other  negative endocrine ROS  Renal/GU negative Renal ROS  negative genitourinary   Musculoskeletal   Abdominal   Peds  Hematology negative hematology ROS (+)   Anesthesia Other Findings Past Medical History: No date: Asthma No date: Bipolar 1 disorder (HCC) No date: Fatty liver No date: Hyperlipemia No date: Hypertension 2017: Seizures (HCC)   Reproductive/Obstetrics negative OB ROS                             Anesthesia Physical Anesthesia Plan  ASA: II  Anesthesia Plan: General   Post-op Pain Management:    Induction:   Airway Management Planned:   Additional Equipment:   Intra-op Plan:   Post-operative Plan:   Informed Consent: I have reviewed the patients History and Physical, chart, labs and discussed the procedure including the risks, benefits and alternatives for the proposed anesthesia with the patient or authorized representative who has indicated his/her understanding and acceptance.   Dental Advisory Given  Plan  Discussed with: Anesthesiologist, CRNA and Surgeon  Anesthesia Plan Comments:         Anesthesia Quick Evaluation

## 2016-10-17 NOTE — Interval H&P Note (Signed)
History and Physical Interval Note:  10/17/2016 9:25 AM  Kristina LauthAnita R Studstill  has presented today for surgery, with the diagnosis of EPIGASTRIC PAIN  The various methods of treatment have been discussed with the patient and family. After consideration of risks, benefits and other options for treatment, the patient has consented to  Procedure(s): ESOPHAGOGASTRODUODENOSCOPY (EGD) WITH PROPOFOL (N/A) as a surgical intervention .  The patient's history has been reviewed, patient examined, no change in status, stable for surgery.  I have reviewed the patient's chart and labs.  Questions were answered to the patient's satisfaction.     SANKAR,SEEPLAPUTHUR G

## 2016-10-17 NOTE — Anesthesia Procedure Notes (Signed)
Performed by: Archit Leger Pre-anesthesia Checklist: Patient identified, Emergency Drugs available, Suction available, Patient being monitored and Timeout performed Patient Re-evaluated:Patient Re-evaluated prior to inductionOxygen Delivery Method: Nasal cannula Preoxygenation: Pre-oxygenation with 100% oxygen Intubation Type: IV induction       

## 2016-10-18 ENCOUNTER — Encounter: Payer: Self-pay | Admitting: General Surgery

## 2016-10-18 LAB — SURGICAL PATHOLOGY

## 2016-10-18 NOTE — Anesthesia Postprocedure Evaluation (Signed)
Anesthesia Post Note  Patient: Kristina Schultz  Procedure(s) Performed: Procedure(s) (LRB): ESOPHAGOGASTRODUODENOSCOPY (EGD) WITH PROPOFOL (N/A)  Patient location during evaluation: Endoscopy Anesthesia Type: General Level of consciousness: awake and alert Pain management: pain level controlled Vital Signs Assessment: post-procedure vital signs reviewed and stable Respiratory status: spontaneous breathing, nonlabored ventilation, respiratory function stable and patient connected to nasal cannula oxygen Cardiovascular status: blood pressure returned to baseline and stable Postop Assessment: no signs of nausea or vomiting Anesthetic complications: no     Last Vitals:  Vitals:   10/17/16 1019 10/17/16 1029  BP: (!) 155/95 (!) 152/96  Pulse: 65 (!) 59  Resp: 13 14  Temp:      Last Pain:  Vitals:   10/17/16 0959  TempSrc: Tympanic                 Lenard SimmerAndrew Kumari Sculley

## 2016-11-23 ENCOUNTER — Telehealth: Payer: Self-pay | Admitting: General Surgery

## 2016-11-23 NOTE — Telephone Encounter (Signed)
Patient called re: recurrent ventral hernia.  She reports the last 3 months she's had intermittent discomfort as well as some early satiety. No vomiting. Last 2 days she's had more reflux.  She reports that the hernia has not been reducible either in the spine or standing position.  She does report moving her bowels.  Presently not making use of Protonix, but has Zantac OTC available at home.  Her clinical description does not suggest new obstruction rather than ongoing discomfort from incarcerated omentum.  CT scan of April 2018 reviewed. Less than 3 cm fascial defect above the umbilicus with no contents.   She may make use of 2-75 mg Zantac OTC tablets twice a day to see if this helps relieve her heartburn at night. She is presently scheduled to meet with Dr. Evette CristalSankar on 12/04/2016 to  discuss scheduling surgery for her recurrent hernia.

## 2016-12-04 ENCOUNTER — Encounter: Payer: Self-pay | Admitting: General Surgery

## 2016-12-04 ENCOUNTER — Ambulatory Visit (INDEPENDENT_AMBULATORY_CARE_PROVIDER_SITE_OTHER): Payer: BLUE CROSS/BLUE SHIELD | Admitting: General Surgery

## 2016-12-04 VITALS — BP 128/82 | HR 78 | Resp 14 | Ht 72.0 in | Wt 256.0 lb

## 2016-12-04 DIAGNOSIS — K432 Incisional hernia without obstruction or gangrene: Secondary | ICD-10-CM

## 2016-12-04 NOTE — Patient Instructions (Addendum)
Recommend adding miralax daily to her regimen Hernia, Adult A hernia is the bulging of an organ or tissue through a weak spot in the muscles of the abdomen (abdominal wall). Hernias develop most often near the navel or groin. There are many kinds of hernias. Common kinds include:  Femoral hernia. This kind of hernia develops under the groin in the upper thigh area.  Inguinal hernia. This kind of hernia develops in the groin or scrotum.  Umbilical hernia. This kind of hernia develops near the navel.  Hiatal hernia. This kind of hernia causes part of the stomach to be pushed up into the chest.  Incisional hernia. This kind of hernia bulges through a scar from an abdominal surgery.  What are the causes? This condition may be caused by:  Heavy lifting.  Coughing over a long period of time.  Straining to have a bowel movement.  An incision made during an abdominal surgery.  A birth defect (congenital defect).  Excess weight or obesity.  Smoking.  Poor nutrition.  Cystic fibrosis.  Excess fluid in the abdomen.  Undescended testicles.  What are the signs or symptoms? Symptoms of a hernia include:  A lump on the abdomen. This is the first sign of a hernia. The lump may become more obvious with standing, straining, or coughing. It may get bigger over time if it is not treated or if the condition causing it is not treated.  Pain. A hernia is usually painless, but it may become painful over time if treatment is delayed. The pain is usually dull and may get worse with standing or lifting heavy objects.  Sometimes a hernia gets tightly squeezed in the weak spot (strangulated) or stuck there (incarcerated) and causes additional symptoms. These symptoms may include:  Vomiting.  Nausea.  Constipation.  Irritability.  How is this diagnosed? A hernia may be diagnosed with:  A physical exam. During the exam your health care provider may ask you to cough or to make a specific  movement, because a hernia is usually more visible when you move.  Imaging tests. These can include: ? X-rays. ? Ultrasound. ? CT scan.  How is this treated? A hernia that is small and painless may not need to be treated. A hernia that is large or painful may be treated with surgery. Inguinal hernias may be treated with surgery to prevent incarceration or strangulation. Strangulated hernias are always treated with surgery, because lack of blood to the trapped organ or tissue can cause it to die. Surgery to treat a hernia involves pushing the bulge back into place and repairing the weak part of the abdomen. Follow these instructions at home:  Avoid straining.  Do not lift anything heavier than 10 lb (4.5 kg).  Lift with your leg muscles, not your back muscles. This helps avoid strain.  When coughing, try to cough gently.  Prevent constipation. Constipation leads to straining with bowel movements, which can make a hernia worse or cause a hernia repair to break down. You can prevent constipation by: ? Eating a high-fiber diet that includes plenty of fruits and vegetables. ? Drinking enough fluids to keep your urine clear or pale yellow. Aim to drink 6-8 glasses of water per day. ? Using a stool softener as directed by your health care provider.  Lose weight, if you are overweight.  Do not use any tobacco products, including cigarettes, chewing tobacco, or electronic cigarettes. If you need help quitting, ask your health care provider.  Keep all follow-up  visits as directed by your health care provider. This is important. Your health care provider may need to monitor your condition. Contact a health care provider if:  You have swelling, redness, and pain in the affected area.  Your bowel habits change. Get help right away if:  You have a fever.  You have abdominal pain that is getting worse.  You feel nauseous or you vomit.  You cannot push the hernia back in place by gently  pressing on it while you are lying down.  The hernia: ? Changes in shape or size. ? Is stuck outside the abdomen. ? Becomes discolored. ? Feels hard or tender. This information is not intended to replace advice given to you by your health care provider. Make sure you discuss any questions you have with your health care provider. Document Released: 05/14/2005 Document Revised: 10/12/2015 Document Reviewed: 03/24/2014 Elsevier Interactive Patient Education  2017 [employer]   The [facility]. She will pre admit by phone. The patient is aware of date and instructions.

## 2016-12-04 NOTE — Progress Notes (Signed)
Patient ID: Kristina Schultz, female   DOB: 1970-10-16, 46 y.o.   MRN: [id number]  Chief Complaint  Patient presents with  . Routine Post Op    HPI Kristina Schultz is a 46 y.o. female.  Here today for her follow up and postoperative visit, ventral hernia 02-19-16. She states she is doing well. She talked with Dr Lemar Livings and was advised to use Zantac and she does not seem to think it is helping. She reports the last 3 months she's had intermittent discomfort as well as some early satiety. She does admit to having bloating after eating.  She states her bowels use to move daily but now she is having some constipation. She is having some central abdominal pain toward the left. She is here with her mother and she lost her father last month. EGD on 10-17-16 showed gastritis.  HPI  Past Medical History:  Diagnosis Date  . Asthma   . Bipolar 1 disorder (HCC)   . Fatty liver   . Hyperlipemia   . Hypertension   . Seizures (HCC) 2017    Past Surgical History:  Procedure Laterality Date  . CYST REMOVAL TRUNK    . ESOPHAGOGASTRODUODENOSCOPY (EGD) WITH PROPOFOL N/A 10/17/2016   Procedure: ESOPHAGOGASTRODUODENOSCOPY (EGD) WITH PROPOFOL;  Surgeon: Kieth Brightly, MD;  Location: [facility] ENDOSCOPY;  Service: Endoscopy;  Laterality: N/A;  . VENTRAL HERNIA REPAIR  02/19/2016   Dr Evette Cristal  . VENTRAL HERNIA REPAIR N/A 02/19/2016   Procedure: incarcerated ventral hernia repair;  Surgeon: Kieth Brightly, MD;  Location: [facility] ORS;  Service: General;  Laterality: N/A;    Family History  Problem Relation Age of Onset  . Transient ischemic attack Father   . Breast cancer Sister     Social History Social History  Substance Use Topics  . Smoking status: Current Every Day Smoker    Packs/day: 0.25    Types: Cigars  . Smokeless tobacco: Never Used     Comment: 2 cigars per day  . Alcohol use No    Allergies  Allergen Reactions  . Atenolol Anaphylaxis  . Lisinopril Other (See Comments)   Pancreatitis   . Prednisone Other (See Comments)    Dizzy and nausea  . Celexa [Citalopram Hydrobromide] Rash    Nosebleed/rash  . Lamictal [Lamotrigine] Rash    rash  . Penicillins Swelling and Rash    Has patient had a PCN reaction causing immediate rash, facial/tongue/throat swelling, SOB or lightheadedness with hypotension: No Has patient had a PCN reaction causing severe rash involving mucus membranes or skin necrosis: Yes Has patient had a PCN reaction that required hospitalization: No Has patient had a PCN reaction occurring within the last 10 years: No If all of the above answers are "NO", then may proceed with Cephalosporin use.   . Sulfa Antibiotics Rash    rash    Current Outpatient Prescriptions  Medication Sig Dispense Refill  . acetaminophen (TYLENOL) 325 MG tablet Take 2 tablets (650 mg total) by mouth every 6 (six) hours as needed for mild pain (or temp > 100). (Patient not taking: Reported on 12/04/2016) 40 tablet 0  . albuterol (PROVENTIL HFA;VENTOLIN HFA) 108 (90 BASE) MCG/ACT inhaler Inhale 2 puffs into the lungs every 6 (six) hours as needed for wheezing or shortness of breath. 1 Inhaler 2  . ranitidine (ZANTAC) 150 MG capsule Take 300 mg by mouth every evening.     . sertraline (ZOLOFT) 50 MG tablet Take 50 mg by mouth at bedtime.    Marland Kitchen  triamcinolone ointment (KENALOG) 0.5 % Apply 1 application topically 2 (two) times daily. (Patient not taking: Reported on 12/04/2016) 30 g 0  . acetaminophen (TYLENOL) 500 MG tablet Take 1,000 mg by mouth every 6 (six) hours as needed for mild pain.    Marland Kitchen. levETIRAcetam (KEPPRA) 750 MG tablet Take 1 tablet (750 mg total) by mouth 2 (two) times daily. (Patient taking differently: Take 375 mg by mouth at bedtime. ) 28 tablet 0   No current facility-administered medications for this visit.     Review of Systems Review of Systems  Constitutional: Negative.   Respiratory: Negative.   Cardiovascular: Negative.   Gastrointestinal:  Positive for abdominal pain and nausea. Negative for vomiting.    Blood pressure 128/82, pulse 78, resp. rate 14, height 6' (1.829 m), weight 256 lb (116.1 kg), last menstrual period 11/10/2016.  Physical Exam Physical Exam  Constitutional: She is oriented to person, place, and time. She appears well-developed and well-nourished.  HENT:  Mouth/Throat: Oropharynx is clear and moist.  Eyes: Conjunctivae are normal. No scleral icterus.  Neck: Neck supple.  Cardiovascular: Normal rate, regular rhythm and normal heart sounds.   Pulmonary/Chest: Effort normal and breath sounds normal.  Abdominal: Soft. Normal appearance and bowel sounds are normal. There is no tenderness. A hernia is present.  Recurrent ventral hernia  Lymphadenopathy:    She has no cervical adenopathy.  Neurological: She is alert and oriented to person, place, and time.  Skin: Skin is warm and dry.  Psychiatric: Her behavior is normal.    Data Reviewed  Prior notes, EGD and CT scan.  Assessment    Recurrent ventral hernia. EGD on 10-17-16 showed gastritis-non specific    Plan   Pain may or may not be related to her hernia. Constipation seems to be more of a functional issue. Recommend adding miralax daily to her regimen.  Hernia precautions and incarceration were discussed with the patient. If they develop symptoms of an incarcerated hernia, they were encouraged to seek prompt medical attention.  I have recommended repair of the hernia laparoscopically using mesh on an outpatient basis in the near future. The risk of infection was reviewed. The role of prosthetic mesh to minimize the risk of recurrence was reviewed.    HPI, Physical Exam, Assessment and Plan have been scribed under the direction and in the presence of Kathreen CosierS. G. Sankar, MD Dorathy DaftMarsha Hatch, RN  The [facility]. She will pre admit by phone. The patient is aware of date and instructions.  Documented by Sinda Duaryl-Lyn M  Kennedy LPN I have completed the exam and reviewed the above documentation for accuracy and completeness.  I agree with the above.  Museum/gallery conserv[employer]agon [employer] has been used and any errors in dictation or transcription are unintentional.  Seeplaputhur G. Evette CristalSankar, M.D., F.A.C.S.  Gerlene BurdockSANKAR,SEEPLAPUTHUR G 12/04/2016, 1:46 PM

## 2016-12-05 ENCOUNTER — Encounter
Admission: RE | Admit: 2016-12-05 | Discharge: 2016-12-05 | Disposition: A | Discharge status: Home / Self Care | Payer: BLUE CROSS/BLUE SHIELD | Source: Ambulatory Visit | Attending: General Surgery | Admitting: General Surgery

## 2016-12-05 HISTORY — DX: Other complications of anesthesia, initial encounter: T88.59XA

## 2016-12-05 HISTORY — DX: Adverse effect of unspecified anesthetic, initial encounter: T41.45XA

## 2016-12-05 HISTORY — DX: Gastro-esophageal reflux disease without esophagitis: K21.9

## 2016-12-05 NOTE — Patient Instructions (Signed)
  Your procedure is scheduled on: 12/07/16 Report to Day Surgery.[facility]OND FLOOR To find out your arrival time please call [phone] between 1PM - 3PM on 12/06/16.  Remember: Instructions that are not followed completely may result in serious medical risk, up to and including death, or upon the discretion of your surgeon and anesthesiologist your surgery may need to be rescheduled.    _X___ 1. Do not eat food or drink liquids after midnight. No gum chewing or hard candies.     ____ 2. No Alcohol for 24 hours before or after surgery.   __X__ 3. Do Not Smoke For 24 Hours Prior to Your Surgery.   ____ 4. Bring all medications with you on the day of surgery if instructed.    _X___ 5. Notify your doctor if there is any change in your medical condition     (cold, fever, infections).       Do not wear jewelry, make-up, hairpins, clips or nail polish.  Do not wear lotions, powders, or perfumes. You may wear deodorant.  Do not shave 48 hours prior to surgery. Men may shave face and neck.  Do not bring valuables to the hospital.    [facility] is not responsible for any belongings or valuables.               Contacts, dentures or bridgework may not be worn into surgery.  Leave your suitcase in the car. After surgery it may be brought to your room.  For patients admitted to the hospital, discharge time is determined by your                treatment team.   Patients discharged the day of surgery will not be allowed to drive home.      ___X_ Take these medicines the morning of surgery with A SIP OF WATER:    1. ZANTAC AT BEDTIME 12/06/16 AND AM OF SURGERY  2.   3.   4.  5.  6.  ____ Fleet Enema (as directed)   ____ Use CHG Soap as directed  ____ Use inhalers on the day of surgery  ____ Stop metformin 2 days prior to surgery    ____ Take 1/2 of usual insulin dose the night before surgery and none on the morning of surgery.   ____ Stop Coumadin/Plavix/aspirin on    ____ Stop Anti-inflammatories on   ____ Stop supplements until after surgery.    ____ Bring C-Pap to the hospital.

## 2016-12-07 ENCOUNTER — Encounter
Admission: RE | Disposition: A | Discharge status: Home / Self Care | Payer: Self-pay | Source: Ambulatory Visit | Attending: General Surgery

## 2016-12-07 ENCOUNTER — Ambulatory Visit: Payer: BLUE CROSS/BLUE SHIELD | Admitting: Anesthesiology

## 2016-12-07 ENCOUNTER — Ambulatory Visit
Admission: RE | Admit: 2016-12-07 | Discharge: 2016-12-07 | Disposition: A | Discharge status: Home / Self Care | Payer: BLUE CROSS/BLUE SHIELD | Source: Ambulatory Visit | Attending: General Surgery | Admitting: General Surgery

## 2016-12-07 ENCOUNTER — Encounter: Payer: Self-pay | Admitting: *Deleted

## 2016-12-07 DIAGNOSIS — Z888 Allergy status to other drugs, medicaments and biological substances status: Secondary | ICD-10-CM | POA: Insufficient documentation

## 2016-12-07 DIAGNOSIS — J45909 Unspecified asthma, uncomplicated: Secondary | ICD-10-CM | POA: Insufficient documentation

## 2016-12-07 DIAGNOSIS — I10 Essential (primary) hypertension: Secondary | ICD-10-CM | POA: Insufficient documentation

## 2016-12-07 DIAGNOSIS — Z882 Allergy status to sulfonamides status: Secondary | ICD-10-CM | POA: Insufficient documentation

## 2016-12-07 DIAGNOSIS — K432 Incisional hernia without obstruction or gangrene: Secondary | ICD-10-CM | POA: Diagnosis not present

## 2016-12-07 DIAGNOSIS — Z880 Allergy status to penicillin: Secondary | ICD-10-CM | POA: Insufficient documentation

## 2016-12-07 DIAGNOSIS — Z79899 Other long term (current) drug therapy: Secondary | ICD-10-CM | POA: Insufficient documentation

## 2016-12-07 DIAGNOSIS — Z7951 Long term (current) use of inhaled steroids: Secondary | ICD-10-CM | POA: Insufficient documentation

## 2016-12-07 DIAGNOSIS — K760 Fatty (change of) liver, not elsewhere classified: Secondary | ICD-10-CM | POA: Insufficient documentation

## 2016-12-07 DIAGNOSIS — F319 Bipolar disorder, unspecified: Secondary | ICD-10-CM | POA: Insufficient documentation

## 2016-12-07 DIAGNOSIS — E785 Hyperlipidemia, unspecified: Secondary | ICD-10-CM | POA: Insufficient documentation

## 2016-12-07 DIAGNOSIS — K219 Gastro-esophageal reflux disease without esophagitis: Secondary | ICD-10-CM | POA: Insufficient documentation

## 2016-12-07 DIAGNOSIS — K5900 Constipation, unspecified: Secondary | ICD-10-CM | POA: Insufficient documentation

## 2016-12-07 DIAGNOSIS — F17290 Nicotine dependence, other tobacco product, uncomplicated: Secondary | ICD-10-CM | POA: Insufficient documentation

## 2016-12-07 DIAGNOSIS — Z87892 Personal history of anaphylaxis: Secondary | ICD-10-CM | POA: Insufficient documentation

## 2016-12-07 HISTORY — PX: VENTRAL HERNIA REPAIR: SHX424

## 2016-12-07 LAB — POCT PREGNANCY, URINE: PREG TEST UR: NEGATIVE

## 2016-12-07 SURGERY — REPAIR, HERNIA, VENTRAL, LAPAROSCOPIC
Anesthesia: General | Wound class: Clean

## 2016-12-07 MED ORDER — FENTANYL CITRATE (PF) 100 MCG/2ML IJ SOLN
25.0000 ug | INTRAMUSCULAR | Status: AC | PRN
  Administered 2016-12-07 (×6): 25 ug via INTRAVENOUS

## 2016-12-07 MED ORDER — FENTANYL CITRATE (PF) 100 MCG/2ML IJ SOLN
INTRAMUSCULAR | Status: DC | PRN
  Administered 2016-12-07: 25 ug via INTRAVENOUS
  Administered 2016-12-07: 100 ug via INTRAVENOUS
  Administered 2016-12-07: 50 ug via INTRAVENOUS
  Administered 2016-12-07: 25 ug via INTRAVENOUS

## 2016-12-07 MED ORDER — LACTATED RINGERS IV SOLN
INTRAVENOUS | Status: DC
  Administered 2016-12-07: 15:00:00 via INTRAVENOUS

## 2016-12-07 MED ORDER — EPHEDRINE SULFATE 50 MG/ML IJ SOLN
INTRAMUSCULAR | Status: DC | PRN
  Administered 2016-12-07: 20 mg via INTRAVENOUS

## 2016-12-07 MED ORDER — CEFAZOLIN SODIUM-DEXTROSE 2-4 GM/100ML-% IV SOLN
INTRAVENOUS | Status: AC
  Filled 2016-12-07: qty 100

## 2016-12-07 MED ORDER — CEFAZOLIN SODIUM-DEXTROSE 2-4 GM/100ML-% IV SOLN
2.0000 g | INTRAVENOUS | Status: AC
  Administered 2016-12-07: 2 g via INTRAVENOUS

## 2016-12-07 MED ORDER — OXYCODONE HCL 5 MG PO TABS
5.0000 mg | ORAL_TABLET | Freq: Once | ORAL | Status: AC
  Administered 2016-12-07: 5 mg via ORAL
  Filled 2016-12-07: qty 1

## 2016-12-07 MED ORDER — FENTANYL CITRATE (PF) 100 MCG/2ML IJ SOLN
INTRAMUSCULAR | Status: AC
Start: 2016-12-07 — End: ?
  Filled 2016-12-07: qty 2

## 2016-12-07 MED ORDER — EPHEDRINE SULFATE 50 MG/ML IJ SOLN
INTRAMUSCULAR | Status: AC
  Filled 2016-12-07: qty 1

## 2016-12-07 MED ORDER — KETOROLAC TROMETHAMINE 30 MG/ML IJ SOLN
INTRAMUSCULAR | Status: DC | PRN
  Administered 2016-12-07: 30 mg via INTRAVENOUS

## 2016-12-07 MED ORDER — OXYCODONE HCL 5 MG PO TABS: 5.0000 mg | ORAL_TABLET | Freq: Four times a day (QID) | ORAL | 0 refills | Status: AC | PRN

## 2016-12-07 MED ORDER — FENTANYL CITRATE (PF) 100 MCG/2ML IJ SOLN
INTRAMUSCULAR | Status: AC
  Filled 2016-12-07: qty 2

## 2016-12-07 MED ORDER — DEXAMETHASONE SODIUM PHOSPHATE 10 MG/ML IJ SOLN
INTRAMUSCULAR | Status: AC
  Filled 2016-12-07: qty 1

## 2016-12-07 MED ORDER — PROPOFOL 10 MG/ML IV BOLUS
INTRAVENOUS | Status: DC | PRN
  Administered 2016-12-07: 150 mg via INTRAVENOUS

## 2016-12-07 MED ORDER — ROCURONIUM BROMIDE 100 MG/10ML IV SOLN
INTRAVENOUS | Status: DC | PRN
  Administered 2016-12-07: 40 mg via INTRAVENOUS
  Administered 2016-12-07: 10 mg via INTRAVENOUS

## 2016-12-07 MED ORDER — SUGAMMADEX SODIUM 500 MG/5ML IV SOLN
INTRAVENOUS | Status: AC
  Filled 2016-12-07: qty 5

## 2016-12-07 MED ORDER — ONDANSETRON HCL 4 MG/2ML IJ SOLN
INTRAMUSCULAR | Status: AC
  Filled 2016-12-07: qty 2

## 2016-12-07 MED ORDER — SUGAMMADEX SODIUM 200 MG/2ML IV SOLN
INTRAVENOUS | Status: DC | PRN
  Administered 2016-12-07: 230 mg via INTRAVENOUS

## 2016-12-07 MED ORDER — MIDAZOLAM HCL 2 MG/2ML IJ SOLN
INTRAMUSCULAR | Status: AC
  Filled 2016-12-07: qty 2

## 2016-12-07 MED ORDER — MIDAZOLAM HCL 2 MG/2ML IJ SOLN
INTRAMUSCULAR | Status: DC | PRN
  Administered 2016-12-07: 2 mg via INTRAVENOUS

## 2016-12-07 MED ORDER — OXYCODONE HCL 5 MG PO TABS
ORAL_TABLET | ORAL | Status: AC
  Administered 2016-12-07: 5 mg via ORAL
  Filled 2016-12-07: qty 1

## 2016-12-07 MED ORDER — FENTANYL CITRATE (PF) 100 MCG/2ML IJ SOLN
INTRAMUSCULAR | Status: AC
  Administered 2016-12-07: 25 ug via INTRAVENOUS
  Filled 2016-12-07: qty 2

## 2016-12-07 MED ORDER — KETOROLAC TROMETHAMINE 30 MG/ML IJ SOLN
INTRAMUSCULAR | Status: AC
  Filled 2016-12-07: qty 1

## 2016-12-07 MED ORDER — ONDANSETRON HCL 4 MG/2ML IJ SOLN: 4.0000 mg | Freq: Once | INTRAMUSCULAR | Status: DC | PRN

## 2016-12-07 MED ORDER — ONDANSETRON HCL 4 MG/2ML IJ SOLN
INTRAMUSCULAR | Status: DC | PRN
  Administered 2016-12-07: 4 mg via INTRAVENOUS

## 2016-12-07 MED ORDER — PROPOFOL 10 MG/ML IV BOLUS
INTRAVENOUS | Status: AC
  Filled 2016-12-07: qty 20

## 2016-12-07 MED ORDER — ROCURONIUM BROMIDE 50 MG/5ML IV SOLN
INTRAVENOUS | Status: AC
  Filled 2016-12-07: qty 1

## 2016-12-07 MED ORDER — CHLORHEXIDINE GLUCONATE CLOTH 2 % EX PADS: 6.0000 | MEDICATED_PAD | Freq: Once | CUTANEOUS | Status: DC

## 2016-12-07 MED ORDER — PHENYLEPHRINE HCL 10 MG/ML IJ SOLN
INTRAMUSCULAR | Status: DC | PRN
  Administered 2016-12-07: 100 ug via INTRAVENOUS

## 2016-12-07 MED ORDER — DEXAMETHASONE SODIUM PHOSPHATE 10 MG/ML IJ SOLN
INTRAMUSCULAR | Status: DC | PRN
  Administered 2016-12-07: 10 mg via INTRAVENOUS

## 2016-12-07 SURGICAL SUPPLY — 33 items
BLADE SURG 11 STRL SS SAFETY (MISCELLANEOUS) ×2 IMPLANT
CANNULA DILATOR 10 W/SLV (CANNULA) IMPLANT
CATH TRAY 16F METER LATEX (MISCELLANEOUS) IMPLANT
CHLORAPREP W/TINT 26ML (MISCELLANEOUS) ×2 IMPLANT
DEFOGGER SCOPE WARMER CLEARIFY (MISCELLANEOUS) ×2 IMPLANT
DERMABOND ADVANCED (GAUZE/BANDAGES/DRESSINGS) ×1
DERMABOND ADVANCED .7 DNX12 (GAUZE/BANDAGES/DRESSINGS) ×1 IMPLANT
DEVICE SECURE STRAP 25 ABSORB (INSTRUMENTS) ×4 IMPLANT
DRAPE INCISE IOBAN 66X45 STRL (DRAPES) ×2 IMPLANT
ELECT REM PT RETURN 9FT ADLT (ELECTROSURGICAL) ×2
ELECTRODE REM PT RTRN 9FT ADLT (ELECTROSURGICAL) ×1 IMPLANT
GLOVE BIO SURGEON STRL SZ7 (GLOVE) ×16 IMPLANT
GOWN STRL REUS W/ TWL LRG LVL3 (GOWN DISPOSABLE) ×4 IMPLANT
GOWN STRL REUS W/TWL LRG LVL3 (GOWN DISPOSABLE) ×4
GRASPER SUT TROCAR 14GX15 (MISCELLANEOUS) ×2 IMPLANT
KIT RM TURNOVER STRD PROC AR (KITS) ×2 IMPLANT
LABEL OR SOLS (LABEL) ×2 IMPLANT
MESH VENT LT ST 11.4CM CRL (Mesh General) ×2 IMPLANT
NEEDLE SPNL 18GX3.5 QUINCKE PK (NEEDLE) ×4 IMPLANT
NEEDLE VERESS 14GA 120MM (NEEDLE) ×2 IMPLANT
NS IRRIG 500ML POUR BTL (IV SOLUTION) ×2 IMPLANT
PACK LAP CHOLECYSTECTOMY (MISCELLANEOUS) ×2 IMPLANT
SCISSORS METZENBAUM CVD 33 (INSTRUMENTS) ×2 IMPLANT
SLEEVE ENDOPATH XCEL 5M (ENDOMECHANICALS) ×2 IMPLANT
SUT GORTEX  CV-0 36 HD (SUTURE)
SUT GORTEX CV-0 36 HD (SUTURE) IMPLANT
SUT PROLENE 0 CT 1 30 (SUTURE) ×6 IMPLANT
SUT VIC AB 0 SH 27 (SUTURE) ×2 IMPLANT
SUT VIC AB 4-0 FS2 27 (SUTURE) ×4 IMPLANT
TROCAR XCEL 12X100 BLDLESS (ENDOMECHANICALS) IMPLANT
TROCAR XCEL NON-BLD 11X100MML (ENDOMECHANICALS) ×2 IMPLANT
TROCAR XCEL NON-BLD 5MMX100MML (ENDOMECHANICALS) ×2 IMPLANT
TUBING INSUF HEATED (TUBING) ×2 IMPLANT

## 2016-12-07 NOTE — Anesthesia Postprocedure Evaluation (Signed)
Anesthesia Post Note  Patient: Kristina Schultz  Procedure(s) Performed: Procedure(s) (LRB): LAPAROSCOPIC VENTRAL HERNIA WITH MESH (N/A)  Patient location during evaluation: PACU Anesthesia Type: General Level of consciousness: awake and alert Pain management: pain level controlled Vital Signs Assessment: post-procedure vital signs reviewed and stable Respiratory status: spontaneous breathing, nonlabored ventilation, respiratory function stable and patient connected to nasal cannula oxygen Cardiovascular status: blood pressure returned to baseline and stable Postop Assessment: no signs of nausea or vomiting Anesthetic complications: no     Last Vitals:  Vitals:   12/07/16 1709 12/07/16 1721  BP:  131/73  Pulse:  81  Resp:  16  Temp: (!) 36.4 C (!) 35.9 C    Last Pain:  Vitals:   12/07/16 1721  TempSrc: Temporal  PainSc: 6                  Cleda MccreedyJoseph K Jeraldin Fesler

## 2016-12-07 NOTE — Interval H&P Note (Signed)
History and Physical Interval Note:  12/07/2016 1:59 PM  Kristina Schultz  has presented today for surgery, with the diagnosis of RECURRENT VENTRAL HERNIA  The various methods of treatment have been discussed with the patient and family. After consideration of risks, benefits and other options for treatment, the patient has consented to  Procedure(s): LAPAROSCOPIC VENTRAL HERNIA (N/A) as a surgical intervention .  The patient's history has been reviewed, patient examined, no change in status, stable for surgery.  I have reviewed the patient's chart and labs.  Questions were answered to the patient's satisfaction.     SANKAR,SEEPLAPUTHUR G

## 2016-12-07 NOTE — Op Note (Signed)
Preop diagnosis: Incisional hernia  Post op diagnosis: Same  Operation: Laparoscopy repair incisional hernia with mesh  Surgeon: Kathreen CosierS. G. Sankar  Assistant:     Anesthesia: Gen.  Complications: None  EBL: Minimal  Drains: None  Description: Patient was put to sleep and then the abdomen was prepped and draped out as sterile field. Timeout was performed. Patient had a repair of an umbilical hernia about a year or so ago located above the umbilicus. She had a recurrence which was fairly notable for a fascial defect of the little over 2-1/2 cm and the patient had symptoms associated with. Based on CT's contents were noted to be mostly fat and no evidence of bowel. A laparoscopic approach was used. Initial port was in the left upper quadrant just below the costal margin where a L1 centimeter incision was made and a Veress needle position the peritoneal cavity verified of the hanging drop method. Pneumoperitoneum was obtained an 11 mm port was placed. The camera was introduced to reveal a circular fascial defect of about 2-1/2 cm located just above the umbilicus and slightly to the left of the previous midline incision. No contents within the hernia were noted. No other defects were identified in this area. With good visualization the 2 lateral 5 mm ports were placed on the left side. A ventral ex patch circular 11.4 cm size was then used. The mesh was positioned inside the peritoneal cavity. And the central proline was pulled up through the middle of the hernia through a stab incision. Using the inflated the balloon part of the mesh was inflated to flatten out the mesh. Secure strap was used to tack the edges all around at a centimeter interval. Using 2 spinal 80s and proline stitch the edges were transfixed to the fascia through stab incisions at 4 levels superior inferior and 2 lateral. Additional tacks were placed up within the mesh to allow for a good approximation. The pneumoperitoneum was released  the fascial opening in the left upper quadrant close 0 Vicryl. Remaining ports removed. Port site skin incisions were closed with subcuticular 4-0 Vicryl. All incisions and the small stab incisions for the transfixing sutures were closed with Dermabond. Procedure was well-tolerated and she was subsequently returned recovery room stable condition

## 2016-12-07 NOTE — Transfer of Care (Signed)
Immediate Anesthesia Transfer of Care Note  Patient: Kristina Schultz  Procedure(s) Performed: Procedure(s): LAPAROSCOPIC VENTRAL HERNIA WITH MESH (N/A)  Patient Location: PACU  Anesthesia Type:General  Level of Consciousness: awake, alert  and oriented  Airway & Oxygen Therapy: Patient Spontanous Breathing and Patient connected to face mask oxygen  Post-op Assessment: Report given to RN and Post -op Vital signs reviewed and stable  Post vital signs: Reviewed and stable  Last Vitals:  Vitals:   12/07/16 1615 12/07/16 1617  BP:  137/82  Pulse:  81  Resp:  12  Temp: (P) 36.5 C     Last Pain:  Vitals:   12/07/16 1245  TempSrc: Tympanic         Complications: No apparent anesthesia complications

## 2016-12-07 NOTE — Anesthesia Post-op Follow-up Note (Cosign Needed)
Anesthesia QCDR form completed.        

## 2016-12-07 NOTE — Anesthesia Procedure Notes (Signed)
Procedure Name: Intubation Date/Time: 12/07/2016 2:44 PM Performed by: Jonna Clark Pre-anesthesia Checklist: Patient identified, Patient being monitored, Timeout performed, Emergency Drugs available and Suction available Patient Re-evaluated:Patient Re-evaluated prior to induction Oxygen Delivery Method: Circle system utilized Preoxygenation: Pre-oxygenation with 100% oxygen Induction Type: IV induction Ventilation: Mask ventilation without difficulty Laryngoscope Size: Mac and 3 Grade View: Grade I Tube type: Oral Tube size: 7.0 mm Number of attempts: 1 Airway Equipment and Method: Stylet Placement Confirmation: ETT inserted through vocal cords under direct vision,  positive ETCO2 and breath sounds checked- equal and bilateral Secured at: 21 cm Tube secured with: Tape Dental Injury: Teeth and Oropharynx as per pre-operative assessment

## 2016-12-07 NOTE — H&P (View-Only) (Signed)
Patient ID: Kristina Schultz, female   DOB: 1970-10-16, 46 y.o.   MRN: [id number]  Chief Complaint  Patient presents with  . Routine Post Op    HPI Kristina Schultz is a 46 y.o. female.  Here today for her follow up and postoperative visit, ventral hernia 02-19-16. She states she is doing well. She talked with Dr Lemar Livings and was advised to use Zantac and she does not seem to think it is helping. She reports the last 3 months she's had intermittent discomfort as well as some early satiety. She does admit to having bloating after eating.  She states her bowels use to move daily but now she is having some constipation. She is having some central abdominal pain toward the left. She is here with her mother and she lost her father last month. EGD on 10-17-16 showed gastritis.  HPI  Past Medical History:  Diagnosis Date  . Asthma   . Bipolar 1 disorder (HCC)   . Fatty liver   . Hyperlipemia   . Hypertension   . Seizures (HCC) 2017    Past Surgical History:  Procedure Laterality Date  . CYST REMOVAL TRUNK    . ESOPHAGOGASTRODUODENOSCOPY (EGD) WITH PROPOFOL N/A 10/17/2016   Procedure: ESOPHAGOGASTRODUODENOSCOPY (EGD) WITH PROPOFOL;  Surgeon: Kieth Brightly, MD;  Location: [facility] ENDOSCOPY;  Service: Endoscopy;  Laterality: N/A;  . VENTRAL HERNIA REPAIR  02/19/2016   Dr Evette Cristal  . VENTRAL HERNIA REPAIR N/A 02/19/2016   Procedure: incarcerated ventral hernia repair;  Surgeon: Kieth Brightly, MD;  Location: [facility] ORS;  Service: General;  Laterality: N/A;    Family History  Problem Relation Age of Onset  . Transient ischemic attack Father   . Breast cancer Sister     Social History Social History  Substance Use Topics  . Smoking status: Current Every Day Smoker    Packs/day: 0.25    Types: Cigars  . Smokeless tobacco: Never Used     Comment: 2 cigars per day  . Alcohol use No    Allergies  Allergen Reactions  . Atenolol Anaphylaxis  . Lisinopril Other (See Comments)   Pancreatitis   . Prednisone Other (See Comments)    Dizzy and nausea  . Celexa [Citalopram Hydrobromide] Rash    Nosebleed/rash  . Lamictal [Lamotrigine] Rash    rash  . Penicillins Swelling and Rash    Has patient had a PCN reaction causing immediate rash, facial/tongue/throat swelling, SOB or lightheadedness with hypotension: No Has patient had a PCN reaction causing severe rash involving mucus membranes or skin necrosis: Yes Has patient had a PCN reaction that required hospitalization: No Has patient had a PCN reaction occurring within the last 10 years: No If all of the above answers are "NO", then may proceed with Cephalosporin use.   . Sulfa Antibiotics Rash    rash    Current Outpatient Prescriptions  Medication Sig Dispense Refill  . acetaminophen (TYLENOL) 325 MG tablet Take 2 tablets (650 mg total) by mouth every 6 (six) hours as needed for mild pain (or temp > 100). (Patient not taking: Reported on 12/04/2016) 40 tablet 0  . albuterol (PROVENTIL HFA;VENTOLIN HFA) 108 (90 BASE) MCG/ACT inhaler Inhale 2 puffs into the lungs every 6 (six) hours as needed for wheezing or shortness of breath. 1 Inhaler 2  . ranitidine (ZANTAC) 150 MG capsule Take 300 mg by mouth every evening.     . sertraline (ZOLOFT) 50 MG tablet Take 50 mg by mouth at bedtime.    Marland Kitchen  triamcinolone ointment (KENALOG) 0.5 % Apply 1 application topically 2 (two) times daily. (Patient not taking: Reported on 12/04/2016) 30 g 0  . acetaminophen (TYLENOL) 500 MG tablet Take 1,000 mg by mouth every 6 (six) hours as needed for mild pain.    Marland Kitchen. levETIRAcetam (KEPPRA) 750 MG tablet Take 1 tablet (750 mg total) by mouth 2 (two) times daily. (Patient taking differently: Take 375 mg by mouth at bedtime. ) 28 tablet 0   No current facility-administered medications for this visit.     Review of Systems Review of Systems  Constitutional: Negative.   Respiratory: Negative.   Cardiovascular: Negative.   Gastrointestinal:  Positive for abdominal pain and nausea. Negative for vomiting.    Blood pressure 128/82, pulse 78, resp. rate 14, height 6' (1.829 m), weight 256 lb (116.1 kg), last menstrual period 11/10/2016.  Physical Exam Physical Exam  Constitutional: She is oriented to person, place, and time. She appears well-developed and well-nourished.  HENT:  Mouth/Throat: Oropharynx is clear and moist.  Eyes: Conjunctivae are normal. No scleral icterus.  Neck: Neck supple.  Cardiovascular: Normal rate, regular rhythm and normal heart sounds.   Pulmonary/Chest: Effort normal and breath sounds normal.  Abdominal: Soft. Normal appearance and bowel sounds are normal. There is no tenderness. A hernia is present.  Recurrent ventral hernia  Lymphadenopathy:    She has no cervical adenopathy.  Neurological: She is alert and oriented to person, place, and time.  Skin: Skin is warm and dry.  Psychiatric: Her behavior is normal.    Data Reviewed  Prior notes, EGD and CT scan.  Assessment    Recurrent ventral hernia. EGD on 10-17-16 showed gastritis-non specific    Plan   Pain may or may not be related to her hernia. Constipation seems to be more of a functional issue. Recommend adding miralax daily to her regimen.  Hernia precautions and incarceration were discussed with the patient. If they develop symptoms of an incarcerated hernia, they were encouraged to seek prompt medical attention.  I have recommended repair of the hernia laparoscopically using mesh on an outpatient basis in the near future. The risk of infection was reviewed. The role of prosthetic mesh to minimize the risk of recurrence was reviewed.    HPI, Physical Exam, Assessment and Plan have been scribed under the direction and in the presence of Kathreen CosierS. G. Brisa Auth, MD Dorathy DaftMarsha Hatch, RN  The patient is scheduled [facility]07[facility]. She will pre admit by phone. The patient is aware of date and instructions.  Documented by Sinda Duaryl-Lyn M  Kennedy LPN I have completed the exam and reviewed the above documentation for accuracy and completeness.  I agree with the above.  Museum/gallery conservator[employer]agon [employer] has been used and any errors in dictation or transcription are unintentional.  Curlee Bogan G. Evette CristalSankar, M.D., F.A.C.S.  Gerlene BurdockSANKAR,Libbi Towner G 12/04/2016, 1:46 PM

## 2016-12-07 NOTE — Anesthesia Preprocedure Evaluation (Signed)
Anesthesia Evaluation  Patient identified by MRN, date of birth, ID band Patient awake    Reviewed: Allergy & Precautions, NPO status , Patient's Chart, lab work & pertinent test results  History of Anesthesia Complications Negative for: history of anesthetic complications  Airway Mallampati: II       Dental   Pulmonary asthma (no problems in several years) , Current Smoker,           Cardiovascular hypertension (pt non-compliant with meds),      Neuro/Psych Seizures - (last about 4 months ago),  Bipolar Disorder    GI/Hepatic Neg liver ROS, GERD  Medicated and Controlled,  Endo/Other  negative endocrine ROS  Renal/GU negative Renal ROS     Musculoskeletal   Abdominal   Peds  Hematology negative hematology ROS (+)   Anesthesia Other Findings   Reproductive/Obstetrics                             Anesthesia Physical Anesthesia Plan  ASA: III  Anesthesia Plan: General   Post-op Pain Management:    Induction:   PONV Risk Score and Plan: 3 and Ondansetron, Dexamethasone, Propofol and Midazolam  Airway Management Planned: Oral ETT  Additional Equipment:   Intra-op Plan:   Post-operative Plan:   Informed Consent: I have reviewed the patients History and Physical, chart, labs and discussed the procedure including the risks, benefits and alternatives for the proposed anesthesia with the patient or authorized representative who has indicated his/her understanding and acceptance.     Plan Discussed with:   Anesthesia Plan Comments:         Anesthesia Quick Evaluation

## 2016-12-09 ENCOUNTER — Encounter: Payer: Self-pay | Admitting: General Surgery

## 2016-12-17 ENCOUNTER — Ambulatory Visit (INDEPENDENT_AMBULATORY_CARE_PROVIDER_SITE_OTHER): Payer: BLUE CROSS/BLUE SHIELD | Admitting: General Surgery

## 2016-12-17 ENCOUNTER — Encounter: Payer: Self-pay | Admitting: General Surgery

## 2016-12-17 VITALS — BP 124/82 | HR 103 | Resp 14 | Ht 72.0 in | Wt 256.0 lb

## 2016-12-17 DIAGNOSIS — K432 Incisional hernia without obstruction or gangrene: Secondary | ICD-10-CM

## 2016-12-17 NOTE — Progress Notes (Signed)
Patient ID: Kristina Schultz, female   DOB: 11/06/1970, 46 y.o.   MRN: [id number]  Chief Complaint  Patient presents with  . Routine Post Op    HPI Kristina Schultz is a 46 y.o. female here today for her post op incisional hernia repair. Patient states she is doing well.  HPI  Past Medical History:  Diagnosis Date  . Asthma   . Bipolar 1 disorder (HCC)   . Complication of anesthesia    PREFERS NOT TO HAVE GAS IF POSSIBLE/STATES SHE SMELLS AND TASTE IT FOR DAYS  . Fatty liver   . GERD (gastroesophageal reflux disease)   . Hyperlipemia   . Hypertension   . Seizures (HCC) 2017   LAST 2017    Past Surgical History:  Procedure Laterality Date  . CYST REMOVAL TRUNK    . ESOPHAGOGASTRODUODENOSCOPY (EGD) WITH PROPOFOL N/A 10/17/2016   Procedure: ESOPHAGOGASTRODUODENOSCOPY (EGD) WITH PROPOFOL;  Surgeon: Kieth BrightlySankar, Seeplaputhur G, MD;  Location: [facility];  Service: Endoscopy;  Laterality: N/A;  . VENTRAL HERNIA REPAIR  02/19/2016   Dr Evette CristalSankar  . VENTRAL HERNIA REPAIR N/A 02/19/2016   Procedure: incarcerated ventral hernia repair;  Surgeon: Kieth BrightlySeeplaputhur G Sankar, MD;  Location: [facility];  Service: General;  Laterality: N/A;  . VENTRAL HERNIA REPAIR N/A 12/07/2016   Procedure: LAPAROSCOPIC VENTRAL HERNIA WITH MESH;  Surgeon: Kieth BrightlySankar, Seeplaputhur G, MD;  Location: [facility];  Service: General;  Laterality: N/A;    Family History  Problem Relation Age of Onset  . Transient ischemic attack Father   . Breast cancer Sister     Social History Social History  Substance Use Topics  . Smoking status: Current Every Day Smoker    Packs/day: 0.25    Types: Cigars  . Smokeless tobacco: Never Used     Comment: 2 cigars per day  . Alcohol use No    Allergies  Allergen Reactions  . Atenolol Anaphylaxis    SWELLING, BREATHING ISSUES  . Lamictal [Lamotrigine] Rash    rash  . Lisinopril Other (See Comments)    Pancreatitis   . Celexa [Citalopram Hydrobromide] Rash    Nosebleed/rash  .  Penicillins Rash and Other (See Comments)    Has patient had a PCN reaction causing immediate rash, facial/tongue/throat swelling, SOB or lightheadedness with hypotension: No Has patient had a PCN reaction causing severe rash involving mucus membranes or skin necrosis: Yes Has patient had a PCN reaction that required hospitalization: No Has patient had a PCN reaction occurring within the last 10 years: No If all of the above answers are "NO", then may proceed with Cephalosporin use GAVE PATIENT A UTI   . Prednisone Other (See Comments)    Dizzy and nausea. STOMACH CRAMPS  . Sulfa Antibiotics Rash    Rash, blisters    Current Outpatient Prescriptions  Medication Sig Dispense Refill  . albuterol (PROVENTIL HFA;VENTOLIN HFA) 108 (90 BASE) MCG/ACT inhaler Inhale 2 puffs into the lungs every 6 (six) hours as needed for wheezing or shortness of breath. 1 Inhaler 2  . levETIRAcetam (KEPPRA) 750 MG tablet Take 1 tablet (750 mg total) by mouth 2 (two) times daily. (Patient taking differently: Take 375 mg by mouth at bedtime. ) 28 tablet 0  . ranitidine (ZANTAC) 150 MG capsule Take 300 mg by mouth every evening.     . sertraline (ZOLOFT) 50 MG tablet Take 50 mg by mouth at bedtime.    . triamcinolone ointment (KENALOG) 0.5 % Apply 1 application topically 2 (two) times  daily. 30 g 0  . oxyCODONE (ROXICODONE) 5 MG immediate release tablet Take 1 tablet (5 mg total) by mouth every 6 (six) hours as needed. (Patient not taking: Reported on 12/17/2016) 20 tablet 0   No current facility-administered medications for this visit.     Review of Systems Review of Systems  Constitutional: Negative.   Respiratory: Negative.   Cardiovascular: Negative.     Blood pressure 124/82, pulse (!) 103, resp. rate 14, height 6' (1.829 m), weight 256 lb (116.1 kg).  Physical Exam Physical Exam  Constitutional: She is oriented to person, place, and time. She appears well-developed and well-nourished.  Abdominal:  Soft. Bowel sounds are normal. No hernia.  Well healed incision sites   Neurological: She is alert and oriented to person, place, and time.  Skin: Skin is warm and dry.    Data Reviewed Operative note  Assessment    Incisional hernia, repaired with Ventralex patch 11.4 cm     Plan    Return in one month  Increase activity as tolerated  Call with any questions or concerns   HPI, Physical Exam, Assessment and Plan have been scribed under the direction and in the presence of Kathreen Cosier, MD.  Milas Kocher, CMA    I have completed the exam and reviewed the above documentation for accuracy and completeness.  I agree with the above.  Museum/gallery conservator has been used and any errors in dictation or transcription are unintentional.  Seeplaputhur G. Evette Cristal, M.D., F.A.C.S.   Gerlene Burdock G 12/17/2016, 11:40 AM

## 2017-01-24 ENCOUNTER — Ambulatory Visit (INDEPENDENT_AMBULATORY_CARE_PROVIDER_SITE_OTHER): Payer: BLUE CROSS/BLUE SHIELD | Admitting: General Surgery

## 2017-01-24 ENCOUNTER — Encounter: Payer: Self-pay | Admitting: General Surgery

## 2017-01-24 VITALS — BP 132/74 | HR 100 | Resp 12 | Ht 72.0 in | Wt 260.0 lb

## 2017-01-24 DIAGNOSIS — K432 Incisional hernia without obstruction or gangrene: Secondary | ICD-10-CM

## 2017-01-24 NOTE — Patient Instructions (Signed)
Patient to return as needed. The patient is aware to call back for any questions or concerns. 

## 2017-01-24 NOTE — Progress Notes (Signed)
Patient ID: Kristina Schultz, female   DOB: 1971-01-17, 46 y.o.   MRN: [id number]  Chief Complaint  Patient presents with  . Routine Post Op    HPI Kristina Schultz is a 46 y.o. female today for her one month follow up incisional hernia repair. Patient states she is doing well.  HPI  Past Medical History:  Diagnosis Date  . Asthma   . Bipolar 1 disorder (HCC)   . Complication of anesthesia    PREFERS NOT TO HAVE GAS IF POSSIBLE/STATES SHE SMELLS AND TASTE IT FOR DAYS  . Fatty liver   . GERD (gastroesophageal reflux disease)   . Hyperlipemia   . Hypertension   . Seizures (HCC) 2017   LAST 2017    Past Surgical History:  Procedure Laterality Date  . CYST REMOVAL TRUNK    . ESOPHAGOGASTRODUODENOSCOPY (EGD) WITH PROPOFOL N/A 10/17/2016   Procedure: ESOPHAGOGASTRODUODENOSCOPY (EGD) WITH PROPOFOL;  Surgeon: Kieth Brightly, MD;  Location: [facility];  Service: Endoscopy;  Laterality: N/A;  . VENTRAL HERNIA REPAIR  02/19/2016   Dr Evette Cristal  . VENTRAL HERNIA REPAIR N/A 02/19/2016   Procedure: incarcerated ventral hernia repair;  Surgeon: Kieth Brightly, MD;  Location: [facility];  Service: General;  Laterality: N/A;  . VENTRAL HERNIA REPAIR N/A 12/07/2016   Procedure: LAPAROSCOPIC VENTRAL HERNIA WITH MESH;  Surgeon: Kieth Brightly, MD;  Location: [facility];  Service: General;  Laterality: N/A;    Family History  Problem Relation Age of Onset  . Transient ischemic attack Father   . Breast cancer Sister     Social History Social History  Substance Use Topics  . Smoking status: Current Every Day Smoker    Packs/day: 0.25    Types: Cigars  . Smokeless tobacco: Never Used     Comment: 2 cigars per day  . Alcohol use No    Allergies  Allergen Reactions  . Atenolol Anaphylaxis    SWELLING, BREATHING ISSUES  . Lamictal [Lamotrigine] Rash    rash  . Lisinopril Other (See Comments)    Pancreatitis   . Celexa [Citalopram Hydrobromide] Rash    Nosebleed/rash  .  Penicillins Rash and Other (See Comments)    Has patient had a PCN reaction causing immediate rash, facial/tongue/throat swelling, SOB or lightheadedness with hypotension: No Has patient had a PCN reaction causing severe rash involving mucus membranes or skin necrosis: Yes Has patient had a PCN reaction that required hospitalization: No Has patient had a PCN reaction occurring within the last 10 years: No If all of the above answers are "NO", then may proceed with Cephalosporin use GAVE PATIENT A UTI   . Prednisone Other (See Comments)    Dizzy and nausea. STOMACH CRAMPS  . Sulfa Antibiotics Rash    Rash, blisters    Current Outpatient Prescriptions  Medication Sig Dispense Refill  . albuterol (PROVENTIL HFA;VENTOLIN HFA) 108 (90 BASE) MCG/ACT inhaler Inhale 2 puffs into the lungs every 6 (six) hours as needed for wheezing or shortness of breath. 1 Inhaler 2  . levETIRAcetam (KEPPRA) 750 MG tablet Take 1 tablet (750 mg total) by mouth 2 (two) times daily. (Patient taking differently: Take 375 mg by mouth at bedtime. ) 28 tablet 0  . oxyCODONE (ROXICODONE) 5 MG immediate release tablet Take 1 tablet (5 mg total) by mouth every 6 (six) hours as needed. (Patient not taking: Reported on 12/17/2016) 20 tablet 0  . ranitidine (ZANTAC) 150 MG capsule Take 300 mg by mouth every evening.     Marland Kitchen  sertraline (ZOLOFT) 50 MG tablet Take 50 mg by mouth at bedtime.    . triamcinolone ointment (KENALOG) 0.5 % Apply 1 application topically 2 (two) times daily. 30 g 0   No current facility-administered medications for this visit.     Review of Systems Review of Systems  Constitutional: Negative.   Respiratory: Negative.   Cardiovascular: Negative.     Blood pressure 132/74, pulse 100, resp. rate 12, height 6' (1.829 m), weight 260 lb (117.9 kg).  Physical Exam Physical Exam  Constitutional: She appears well-developed and well-nourished.  Cardiovascular: Normal rate, regular rhythm and normal heart  sounds.   Pulmonary/Chest: Effort normal and breath sounds normal.  Abdominal: Soft. Normal appearance and bowel sounds are normal. There is no hepatomegaly. No hernia.  Ventral hernia repair intact and well healed.   All port sites are well healed  Data Reviewed Prior notes reviewed   Assessment    Postop laparoscopy and Ventral hernia repair with mesh. Patient is doing well and the repair is intact Plan     Patient to return as needed. No activity restrictions The patient is aware to call back for any questions or concerns.     HPI, Physical Exam, Assessment and Plan have been scribed under the direction and in the presence of Kathreen CosierS. G. Shaley Leavens, MD  Ples SpecterJessica Qualls, CMA  I have completed the exam and reviewed the above documentation for accuracy and completeness.  I agree with the above.  Museum/gallery conservatorDragon Technology has been used and any errors in dictation or transcription are unintentional.  Cage Gupton G. Evette CristalSankar, M.D., F.A.C.S.    Gerlene BurdockSANKAR,Danniel Grenz G 01/24/2017, 11:20 AM

## 2017-05-15 ENCOUNTER — Emergency Department
Admission: EM | Admit: 2017-05-15 | Discharge: 2017-05-15 | Disposition: A | Discharge status: Home / Self Care | Payer: Self-pay | Attending: Emergency Medicine | Admitting: Emergency Medicine

## 2017-05-15 ENCOUNTER — Emergency Department: Payer: Self-pay

## 2017-05-15 ENCOUNTER — Encounter: Payer: Self-pay | Admitting: Emergency Medicine

## 2017-05-15 DIAGNOSIS — F17290 Nicotine dependence, other tobacco product, uncomplicated: Secondary | ICD-10-CM | POA: Insufficient documentation

## 2017-05-15 DIAGNOSIS — R109 Unspecified abdominal pain: Secondary | ICD-10-CM | POA: Insufficient documentation

## 2017-05-15 DIAGNOSIS — I10 Essential (primary) hypertension: Secondary | ICD-10-CM | POA: Insufficient documentation

## 2017-05-15 DIAGNOSIS — J45909 Unspecified asthma, uncomplicated: Secondary | ICD-10-CM | POA: Insufficient documentation

## 2017-05-15 LAB — CBC
HEMATOCRIT: 37.9 % (ref 35.0–47.0)
HEMOGLOBIN: 12.7 g/dL (ref 12.0–16.0)
MCH: 29.9 pg (ref 26.0–34.0)
MCHC: 33.6 g/dL (ref 32.0–36.0)
MCV: 89.1 fL (ref 80.0–100.0)
Platelets: 252 10*3/uL (ref 150–440)
RBC: 4.26 MIL/uL (ref 3.80–5.20)
RDW: 13.1 % (ref 11.5–14.5)
WBC: 6.3 10*3/uL (ref 3.6–11.0)

## 2017-05-15 LAB — URINALYSIS, COMPLETE (UACMP) WITH MICROSCOPIC
BILIRUBIN URINE: NEGATIVE
GLUCOSE, UA: NEGATIVE mg/dL
Ketones, ur: NEGATIVE mg/dL
LEUKOCYTES UA: NEGATIVE
Nitrite: NEGATIVE
PROTEIN: 100 mg/dL — AB
Specific Gravity, Urine: 1.019 (ref 1.005–1.030)
pH: 5 (ref 5.0–8.0)

## 2017-05-15 LAB — COMPREHENSIVE METABOLIC PANEL
ALT: 16 U/L (ref 14–54)
ANION GAP: 8 (ref 5–15)
AST: 22 U/L (ref 15–41)
Albumin: 3.9 g/dL (ref 3.5–5.0)
Alkaline Phosphatase: 107 U/L (ref 38–126)
BUN: 9 mg/dL (ref 6–20)
CHLORIDE: 98 mmol/L — AB (ref 101–111)
CO2: 25 mmol/L (ref 22–32)
Calcium: 9.3 mg/dL (ref 8.9–10.3)
Creatinine, Ser: 0.9 mg/dL (ref 0.44–1.00)
Glucose, Bld: 110 mg/dL — ABNORMAL HIGH (ref 65–99)
POTASSIUM: 4 mmol/L (ref 3.5–5.1)
Sodium: 131 mmol/L — ABNORMAL LOW (ref 135–145)
Total Bilirubin: 0.5 mg/dL (ref 0.3–1.2)
Total Protein: 8.2 g/dL — ABNORMAL HIGH (ref 6.5–8.1)

## 2017-05-15 LAB — LIPASE, BLOOD: LIPASE: 20 U/L (ref 11–51)

## 2017-05-15 LAB — POCT PREGNANCY, URINE: PREG TEST UR: NEGATIVE

## 2017-05-15 MED ORDER — POLYETHYLENE GLYCOL 3350 17 G PO PACK: 17.0000 g | PACK | Freq: Every day | ORAL | 0 refills | Status: DC

## 2017-05-15 MED ORDER — NITROFURANTOIN MONOHYD MACRO 100 MG PO CAPS: 100.0000 mg | ORAL_CAPSULE | Freq: Two times a day (BID) | ORAL | 0 refills | Status: DC

## 2017-05-15 MED ORDER — OXYCODONE-ACETAMINOPHEN 5-325 MG PO TABS
2.0000 | ORAL_TABLET | Freq: Once | ORAL | Status: AC
  Administered 2017-05-15: 2 via ORAL
  Filled 2017-05-15: qty 2

## 2017-05-15 MED ORDER — FAMOTIDINE 20 MG PO TABS: 20.0000 mg | ORAL_TABLET | Freq: Two times a day (BID) | ORAL | 1 refills | Status: DC

## 2017-05-15 NOTE — ED Notes (Signed)
Patient transported to X-ray 

## 2017-05-15 NOTE — ED Triage Notes (Signed)
Patient presents to ED via POV from home with c/o abdominal pain x 3 weeks. Patient denies V/D. Patient reports HA as well.

## 2017-05-15 NOTE — ED Provider Notes (Signed)
[facility] Emergency Department Provider Note       Time seen: ----------------------------------------- 12:31 PM on 05/15/2017 -----------------------------------------   I have reviewed the triage vital signs and the nursing notes.  HISTORY   Chief Complaint Abdominal Pain    HPI Kristina Schultz is a 46 y.o. female with a history of asthma, bipolar disorder, hyperlipidemia and hypertension who presents to the ED for abdominal pain for the past 3 weeks.  Nothing makes her symptoms better or worse.  Patient reports little p.o. intake over the last 24 hours.  She states she used to be on an antacid but does not take anything after she had stomach surgery.  She also reports she is to take blood pressure medicine but developed too many allergies.  Past Medical History:  Diagnosis Date  . Asthma   . Bipolar 1 disorder (HCC)   . Complication of anesthesia    PREFERS NOT TO HAVE GAS IF POSSIBLE/STATES SHE SMELLS AND TASTE IT FOR DAYS  . Fatty liver   . GERD (gastroesophageal reflux disease)   . Hyperlipemia   . Hypertension   . Seizures (HCC) 2017   LAST 2017    Patient Active Problem List   Diagnosis Date Noted  . Incarcerated ventral hernia 02/19/2016    Past Surgical History:  Procedure Laterality Date  . CYST REMOVAL TRUNK    . ESOPHAGOGASTRODUODENOSCOPY (EGD) WITH PROPOFOL N/A 10/17/2016   Procedure: ESOPHAGOGASTRODUODENOSCOPY (EGD) WITH PROPOFOL;  Surgeon: Kieth BrightlySankar, Seeplaputhur G, MD;  Location: [facility];  Service: Endoscopy;  Laterality: N/A;  . VENTRAL HERNIA REPAIR  02/19/2016   Dr Evette CristalSankar  . VENTRAL HERNIA REPAIR N/A 02/19/2016   Procedure: incarcerated ventral hernia repair;  Surgeon: Kieth BrightlySeeplaputhur G Sankar, MD;  Location: [facility];  Service: General;  Laterality: N/A;  . VENTRAL HERNIA REPAIR N/A 12/07/2016   Procedure: LAPAROSCOPIC VENTRAL HERNIA WITH MESH;  Surgeon: Kieth BrightlySankar, Seeplaputhur G, MD;  Location: [facility];  Service: General;   Laterality: N/A;    Allergies Atenolol; Lamictal [lamotrigine]; Lisinopril; Celexa [citalopram hydrobromide]; Penicillins; Prednisone; and Sulfa antibiotics  Social History Social History   Tobacco Use  . Smoking status: Current Every Day Smoker    Packs/day: 0.25    Types: Cigars  . Smokeless tobacco: Never Used  . Tobacco comment: 2 cigars per day  Substance Use Topics  . Alcohol use: No  . Drug use: No    Review of Systems Constitutional: Negative for fever. Eyes: Negative for vision changes ENT:  Negative for congestion, sore throat Cardiovascular: Negative for chest pain. Respiratory: Negative for shortness of breath. Gastrointestinal: Positive for abdominal pain Genitourinary: Negative for dysuria. Musculoskeletal: Negative for back pain. Skin: Negative for rash. Neurological: Positive for weakness and fatigue  All systems negative/normal/unremarkable except as stated in the HPI  ____________________________________________   PHYSICAL EXAM:  VITAL SIGNS: ED Triage Vitals [05/15/17 1203]  Enc Vitals Group     BP (!) 155/90     Pulse Rate 92     Resp 16     Temp 98.4 F (36.9 C)     Temp Source Oral     SpO2 99 %     Weight 270 lb (122.5 kg)     Height 6' (1.829 m)     Head Circumference      Peak Flow      Pain Score      Pain Loc      Pain Edu?      Excl. in GC?  Constitutional: Alert and oriented. Well appearing and in no distress. Eyes: Conjunctivae are normal. Normal extraocular movements. ENT   Head: Normocephalic and atraumatic.   Nose: No congestion/rhinnorhea.   Mouth/Throat: Mucous membranes are moist.   Neck: No stridor. Cardiovascular: Normal rate, regular rhythm. No murmurs, rubs, or gallops. Respiratory: Normal respiratory effort without tachypnea nor retractions. Breath sounds are clear and equal bilaterally. No wheezes/rales/rhonchi. Gastrointestinal: Soft and nontender. Normal bowel sounds Musculoskeletal:  Nontender with normal range of motion in extremities. No lower extremity tenderness nor edema. Neurologic:  Normal speech and language. No gross focal neurologic deficits are appreciated.  Skin:  Skin is warm, dry and intact. No rash noted. Psychiatric: Mood and affect are normal. Speech and behavior are normal.  ____________________________________________  ED COURSE:  As part of my medical decision making, I reviewed the following data within the electronic MEDICAL RECORD NUMBER History obtained from family if available, nursing notes, old chart and ekg, as well as notes from prior ED visits. Patient presented for abdominal pain and weakness, we will assess with labs and imaging as indicated at this time.   Procedures ____________________________________________   LABS (pertinent positives/negatives)  Labs Reviewed  COMPREHENSIVE METABOLIC PANEL - Abnormal; Notable for the following components:      Result Value   Sodium 131 (*)    Chloride 98 (*)    Glucose, Bld 110 (*)    Total Protein 8.2 (*)    All other components within normal limits  URINALYSIS, COMPLETE (UACMP) WITH MICROSCOPIC - Abnormal; Notable for the following components:   Color, Urine YELLOW (*)    APPearance HAZY (*)    Hgb urine dipstick MODERATE (*)    Protein, ur 100 (*)    Bacteria, UA FEW (*)    Squamous Epithelial / LPF 0-5 (*)    All other components within normal limits  LIPASE, BLOOD  CBC  POC URINE PREG, ED  POCT PREGNANCY, URINE    RADIOLOGY Images were viewed by me  Abdomen 2 view  IMPRESSION: Moderate colonic stool burden may be normal in this patient, but it could reflect constipation in the appropriate clinical setting. No evidence of bowel obstruction in this patient with a known ventral hernia. ____________________________________________  DIFFERENTIAL DIAGNOSIS   GERD, peptic ulcer disease, bowel obstruction, constipation, anemia, electrolyte abnormality, dehydration  FINAL  ASSESSMENT AND PLAN  Abdominal pain   Plan: Patient had presented for abdominal pain. Patient's labs are unremarkable except for possible UTI. Patient's imaging constipation.  She will be discharged with MiraLAX, Pepcid, Macrobid.  Otherwise she is stable for outpatient follow-up.   Emily FilbertWilliams, Lamiah Marmol E, MD   Note: This note was generated in part or whole with voice recognition software. Voice recognition is usually quite accurate but there are transcription errors that can and very often do occur. I apologize for any typographical errors that were not detected and corrected.     Emily FilbertWilliams, Joanmarie Tsang E, MD 05/15/17 [phone]

## 2017-06-05 ENCOUNTER — Telehealth: Payer: Self-pay

## 2017-06-15 ENCOUNTER — Other Ambulatory Visit: Payer: Self-pay

## 2017-06-15 ENCOUNTER — Encounter: Payer: Self-pay | Admitting: *Deleted

## 2017-06-15 ENCOUNTER — Emergency Department
Admission: EM | Admit: 2017-06-15 | Discharge: 2017-06-15 | Disposition: A | Discharge status: Home / Self Care | Payer: Self-pay | Attending: Emergency Medicine | Admitting: Emergency Medicine

## 2017-06-15 DIAGNOSIS — Z79899 Other long term (current) drug therapy: Secondary | ICD-10-CM | POA: Insufficient documentation

## 2017-06-15 DIAGNOSIS — I10 Essential (primary) hypertension: Secondary | ICD-10-CM | POA: Insufficient documentation

## 2017-06-15 DIAGNOSIS — F319 Bipolar disorder, unspecified: Secondary | ICD-10-CM | POA: Insufficient documentation

## 2017-06-15 DIAGNOSIS — J45909 Unspecified asthma, uncomplicated: Secondary | ICD-10-CM | POA: Insufficient documentation

## 2017-06-15 DIAGNOSIS — T7840XA Allergy, unspecified, initial encounter: Secondary | ICD-10-CM | POA: Insufficient documentation

## 2017-06-15 DIAGNOSIS — F17290 Nicotine dependence, other tobacco product, uncomplicated: Secondary | ICD-10-CM | POA: Insufficient documentation

## 2017-06-15 MED ORDER — METHYLPREDNISOLONE SODIUM SUCC 125 MG IJ SOLR
125.0000 mg | Freq: Once | INTRAMUSCULAR | Status: AC
  Administered 2017-06-15: 125 mg via INTRAMUSCULAR
  Filled 2017-06-15: qty 2

## 2017-06-15 MED ORDER — FAMOTIDINE 20 MG PO TABS: 20.0000 mg | ORAL_TABLET | Freq: Two times a day (BID) | ORAL | 0 refills | Status: DC

## 2017-06-15 MED ORDER — PREDNISONE 50 MG PO TABS: ORAL_TABLET | ORAL | 0 refills | Status: DC

## 2017-06-15 MED ORDER — CEPHALEXIN 500 MG PO CAPS: 500.0000 mg | ORAL_CAPSULE | Freq: Three times a day (TID) | ORAL | 0 refills | Status: AC

## 2017-06-15 MED ORDER — FLUCONAZOLE 150 MG PO TABS: 150.0000 mg | ORAL_TABLET | Freq: Every day | ORAL | 1 refills | Status: DC

## 2017-06-15 NOTE — ED Triage Notes (Addendum)
Pt presents w/ swelling and redness periorbital to R eye. Pt states on Thursday after she used essential oil near to R eye. The oil was frankinscense w/o a carrier oil. Pt states the skin immediately started burning and oozing clear fluid from the mole near her R eye. Pt states she squeezed the mole and now it is hard and leaking clear fluid. Pt denies fever, but states the reddened area near R eye feels warm to the touch.     Pt states she used hydrocortisone and benadryl Friday night w/o relief. Pt states she used peroxide last night, w/o relief. Pt states she used neosporin today, w/o improvement or relief.

## 2017-06-15 NOTE — ED Provider Notes (Signed)
[facility] Emergency Department Provider Note  ____________________________________________  Time seen: Approximately 6:44 PM  I have reviewed the triage vital signs and the nursing notes.   HISTORY  Chief Complaint Allergic Reaction    HPI Kristina Schultz is a 47 y.o. female presents to the emergency department with right periorbital erythema with blister formation since using frankincense oil.  Patient reports that she has had scab formation and is "oozing fluid". She denies shortness of breath, chest tightness, nausea, vomiting or diarrhea.  Patient has no history of anaphylaxis.  She has been using Benadryl, which provides her temporary relief.  No other alleviating measures have been attempted.  Past Medical History:  Diagnosis Date  . Asthma   . Bipolar 1 disorder (HCC)   . Complication of anesthesia    PREFERS NOT TO HAVE GAS IF POSSIBLE/STATES SHE SMELLS AND TASTE IT FOR DAYS  . Fatty liver   . GERD (gastroesophageal reflux disease)   . Hyperlipemia   . Hypertension   . Seizures (HCC) 2017   LAST 2017    Patient Active Problem List   Diagnosis Date Noted  . Incarcerated ventral hernia 02/19/2016    Past Surgical History:  Procedure Laterality Date  . CYST REMOVAL TRUNK    . ESOPHAGOGASTRODUODENOSCOPY (EGD) WITH PROPOFOL N/A 10/17/2016   Procedure: ESOPHAGOGASTRODUODENOSCOPY (EGD) WITH PROPOFOL;  Surgeon: Kieth Brightly, MD;  Location: [facility] ENDOSCOPY;  Service: Endoscopy;  Laterality: N/A;  . VENTRAL HERNIA REPAIR  02/19/2016   Dr Evette Cristal  . VENTRAL HERNIA REPAIR N/A 02/19/2016   Procedure: incarcerated ventral hernia repair;  Surgeon: Kieth Brightly, MD;  Location: [facility] ORS;  Service: General;  Laterality: N/A;  . VENTRAL HERNIA REPAIR N/A 12/07/2016   Procedure: LAPAROSCOPIC VENTRAL HERNIA WITH MESH;  Surgeon: Kieth Brightly, MD;  Location: [facility] ORS;  Service: General;  Laterality: N/A;    Prior to Admission  medications   Medication Sig Start Date End Date Taking? Authorizing Provider  albuterol (PROVENTIL HFA;VENTOLIN HFA) 108 (90 BASE) MCG/ACT inhaler Inhale 2 puffs into the lungs every 6 (six) hours as needed for wheezing or shortness of breath. 03/06/15   Jene Every, MD  famotidine (PEPCID) 20 MG tablet Take 1 tablet (20 mg total) by mouth 2 (two) times daily. 05/15/17   Emily Filbert, MD  levETIRAcetam (KEPPRA) 750 MG tablet Take 1 tablet (750 mg total) by mouth 2 (two) times daily. Patient taking differently: Take 375 mg by mouth at bedtime.  04/26/16 05/15/17  Willy Eddy, MD  nitrofurantoin, macrocrystal-monohydrate, (MACROBID) 100 MG capsule Take 1 capsule (100 mg total) by mouth 2 (two) times daily. 05/15/17   Emily Filbert, MD  oxyCODONE (ROXICODONE) 5 MG immediate release tablet Take 1 tablet (5 mg total) by mouth every 6 (six) hours as needed. Patient not taking: Reported on 12/17/2016 12/07/16 12/07/17  Kieth Brightly, MD  polyethylene glycol (MIRALAX / GLYCOLAX) packet Take 17 g by mouth daily. 05/15/17   Emily Filbert, MD  sertraline (ZOLOFT) 50 MG tablet Take 50 mg by mouth at bedtime.    [provider]  triamcinolone ointment (KENALOG) 0.5 % Apply 1 application topically 2 (two) times daily. 01/23/15   Triplett, Rulon Eisenmenger B, FNP    Allergies Atenolol; Lamictal [lamotrigine]; Lisinopril; Losartan; Celexa [citalopram hydrobromide]; Penicillins; Prednisone; and Sulfa antibiotics  Family History  Problem Relation Age of Onset  . Transient ischemic attack Father   . Breast cancer Sister     Social History Social  History   Tobacco Use  . Smoking status: Current Every Day Smoker    Packs/day: 0.25    Types: Cigars  . Smokeless tobacco: Never Used  . Tobacco comment: 2 cigars per day  Substance Use Topics  . Alcohol use: No  . Drug use: No     Review of Systems  Constitutional: No fever/chills Eyes: No visual changes. No  discharge ENT: No upper respiratory complaints. Cardiovascular: no chest pain. Respiratory: no cough. No SOB. Gastrointestinal: No abdominal pain.  No nausea, no vomiting.  No diarrhea.  No constipation. Musculoskeletal: Negative for musculoskeletal pain. Skin: Patient has right periorbital erythema with scabbing and evidence of serosanguineous exudate. Neurological: Negative for headaches, focal weakness or numbness. ____________________________________________   PHYSICAL EXAM:  VITAL SIGNS: ED Triage Vitals  Enc Vitals Group     BP 06/15/17 1714 140/90     Pulse Rate 06/15/17 1714 93     Resp 06/15/17 1714 20     Temp 06/15/17 1714 98.5 F (36.9 C)     Temp src --      SpO2 06/15/17 1714 98 %     Weight 06/15/17 1715 268 lb (121.6 kg)     Height 06/15/17 1715 6' (1.829 m)     Head Circumference --      Peak Flow --      Pain Score 06/15/17 1714 8     Pain Loc --      Pain Edu? --      Excl. in GC? --      Constitutional: Alert and oriented. Well appearing and in no acute distress. Eyes: Conjunctivae are normal. PERRL. EOMI. Head: Atraumatic. ENT:      Ears: TMs are pearly bilaterally.      Nose: No congestion/rhinnorhea.      Mouth/Throat: Mucous membranes are moist.  Neck: No stridor. No cervical spine tenderness to palpation. Hematological/Lymphatic/Immunilogical: No cervical lymphadenopathy. Cardiovascular: Normal rate, regular rhythm. Normal S1 and S2.  Good peripheral circulation. Respiratory: Normal respiratory effort without tachypnea or retractions. Lungs CTAB. Good air entry to the bases with no decreased or absent breath sounds. Gastrointestinal: Bowel sounds 4 quadrants. Soft and nontender to palpation. No guarding or rigidity. No palpable masses. No distention. No CVA tenderness. Musculoskeletal: Full range of motion to all extremities. No gross deformities appreciated.  Skin: Patient has right periorbital erythema with associated blister formation and  scabbing. ____________________________________________   LABS (all labs ordered are listed, but only abnormal results are displayed)  Labs Reviewed - No data to display ____________________________________________  EKG   ____________________________________________  RADIOLOGY   No results found.  ____________________________________________    PROCEDURES  Procedure(s) performed:    Procedures    Medications  methylPREDNISolone sodium succinate (SOLU-MEDROL) 125 mg/2 mL injection 125 mg (not administered)     ____________________________________________   INITIAL IMPRESSION / ASSESSMENT AND PLAN / ED COURSE  Pertinent labs & imaging results that were available during my care of the patient were reviewed by me and considered in my medical decision making (see chart for details).  Review of the [place] CSRS was performed in accordance of the [facility] prior to dispensing any controlled drugs.     Assessment and plan Contact dermatitis Staph infection Patient presents to the emergency department with erythema overlying the right face with blister formation and evidence of a secondary staph infection.  Patient was given Solu-Medrol in the emergency department.  She was discharged with prednisone, famotidine and advised to continue using Benadryl.  She was also discharged with Keflex and a prescription for Diflucan was given.  Patient was advised to return to the emergency department in 3 days if symptoms do not improve.  Strict return precautions were given to return to the emergency department for new or worsening symptoms.  Patient is allergic to sulfa.  Patient was advised that if secondary staph infection does not improve with Keflex, she would be transitioned to clindamycin.  Vital signs are reassuring prior to discharge.  All patient questions were answered.    ____________________________________________  FINAL CLINICAL IMPRESSION(S) / ED DIAGNOSES  Final  diagnoses:  Allergic reaction, initial encounter      NEW MEDICATIONS STARTED DURING THIS VISIT:  ED Discharge Orders    None          This chart was dictated using voice recognition software/Dragon. Despite best efforts to proofread, errors can occur which can change the meaning. Any change was purely unintentional.    Orvil Feil, PA-C 06/15/17 1900    Sharyn Creamer, MD 06/16/17 [phone]

## 2017-06-19 NOTE — Telephone Encounter (Signed)
Message left for the patient to call back regarding her billing and Medicaid coverage.

## 2017-08-27 ENCOUNTER — Emergency Department: Payer: Self-pay

## 2017-08-27 ENCOUNTER — Emergency Department
Admission: EM | Admit: 2017-08-27 | Discharge: 2017-08-27 | Disposition: A | Discharge status: Home / Self Care | Payer: Self-pay | Attending: Emergency Medicine | Admitting: Emergency Medicine

## 2017-08-27 ENCOUNTER — Encounter: Payer: Self-pay | Admitting: *Deleted

## 2017-08-27 ENCOUNTER — Other Ambulatory Visit: Payer: Self-pay

## 2017-08-27 DIAGNOSIS — K859 Acute pancreatitis without necrosis or infection, unspecified: Secondary | ICD-10-CM | POA: Insufficient documentation

## 2017-08-27 DIAGNOSIS — J45909 Unspecified asthma, uncomplicated: Secondary | ICD-10-CM | POA: Insufficient documentation

## 2017-08-27 DIAGNOSIS — R8271 Bacteriuria: Secondary | ICD-10-CM | POA: Insufficient documentation

## 2017-08-27 DIAGNOSIS — F17290 Nicotine dependence, other tobacco product, uncomplicated: Secondary | ICD-10-CM | POA: Insufficient documentation

## 2017-08-27 DIAGNOSIS — I10 Essential (primary) hypertension: Secondary | ICD-10-CM | POA: Insufficient documentation

## 2017-08-27 DIAGNOSIS — F319 Bipolar disorder, unspecified: Secondary | ICD-10-CM | POA: Insufficient documentation

## 2017-08-27 DIAGNOSIS — Z79899 Other long term (current) drug therapy: Secondary | ICD-10-CM | POA: Insufficient documentation

## 2017-08-27 LAB — CBC
HCT: 38.4 % (ref 35.0–47.0)
Hemoglobin: 12.5 g/dL (ref 12.0–16.0)
MCH: 29 pg (ref 26.0–34.0)
MCHC: 32.6 g/dL (ref 32.0–36.0)
MCV: 88.8 fL (ref 80.0–100.0)
PLATELETS: 270 10*3/uL (ref 150–440)
RBC: 4.32 MIL/uL (ref 3.80–5.20)
RDW: 13.5 % (ref 11.5–14.5)
WBC: 6 10*3/uL (ref 3.6–11.0)

## 2017-08-27 LAB — URINALYSIS, COMPLETE (UACMP) WITH MICROSCOPIC
BILIRUBIN URINE: NEGATIVE
GLUCOSE, UA: NEGATIVE mg/dL
KETONES UR: NEGATIVE mg/dL
LEUKOCYTES UA: NEGATIVE
NITRITE: NEGATIVE
PROTEIN: 100 mg/dL — AB
Specific Gravity, Urine: 1.02 (ref 1.005–1.030)
pH: 5 (ref 5.0–8.0)

## 2017-08-27 LAB — POCT PREGNANCY, URINE: PREG TEST UR: NEGATIVE

## 2017-08-27 LAB — TROPONIN I: Troponin I: <0.03 ng/mL (ref <0.03)

## 2017-08-27 LAB — COMPREHENSIVE METABOLIC PANEL
ALK PHOS: 81 U/L (ref 38–126)
ALT: 16 U/L (ref 14–54)
AST: 23 U/L (ref 15–41)
Albumin: 4.1 g/dL (ref 3.5–5.0)
Anion gap: 8 (ref 5–15)
BUN: 11 mg/dL (ref 6–20)
CALCIUM: 9.3 mg/dL (ref 8.9–10.3)
CO2: 26 mmol/L (ref 22–32)
CREATININE: 0.96 mg/dL (ref 0.44–1.00)
Chloride: 101 mmol/L (ref 101–111)
GFR calc non Af Amer: >60 mL/min (ref >60)
GLUCOSE: 108 mg/dL — AB (ref 65–99)
Potassium: 4 mmol/L (ref 3.5–5.1)
SODIUM: 135 mmol/L (ref 135–145)
Total Bilirubin: 0.5 mg/dL (ref 0.3–1.2)
Total Protein: 7.8 g/dL (ref 6.5–8.1)

## 2017-08-27 LAB — LIPASE, BLOOD: Lipase: 72 U/L — ABNORMAL HIGH (ref 11–51)

## 2017-08-27 MED ORDER — SODIUM CHLORIDE 0.9 % IV BOLUS
1000.0000 mL | Freq: Once | INTRAVENOUS | Status: AC
  Administered 2017-08-27: 1000 mL via INTRAVENOUS

## 2017-08-27 MED ORDER — OXYCODONE-ACETAMINOPHEN 5-325 MG PO TABS: 1.0000 | ORAL_TABLET | ORAL | 0 refills | Status: DC | PRN

## 2017-08-27 MED ORDER — KETOROLAC TROMETHAMINE 30 MG/ML IJ SOLN
30.0000 mg | Freq: Once | INTRAMUSCULAR | Status: AC
  Administered 2017-08-27: 30 mg via INTRAVENOUS
  Filled 2017-08-27: qty 1

## 2017-08-27 MED ORDER — ONDANSETRON 4 MG PO TBDP: 4.0000 mg | ORAL_TABLET | Freq: Three times a day (TID) | ORAL | 0 refills | Status: DC | PRN

## 2017-08-27 MED ORDER — SENNOSIDES-DOCUSATE SODIUM 8.6-50 MG PO TABS: 1.0000 | ORAL_TABLET | Freq: Every day | ORAL | 0 refills | Status: DC | PRN

## 2017-08-27 MED ORDER — ONDANSETRON HCL 4 MG/2ML IJ SOLN
4.0000 mg | Freq: Once | INTRAMUSCULAR | Status: AC
  Administered 2017-08-27: 4 mg via INTRAVENOUS
  Filled 2017-08-27: qty 2

## 2017-08-27 MED ORDER — IOPAMIDOL (ISOVUE-300) INJECTION 61%
100.0000 mL | Freq: Once | INTRAVENOUS | Status: AC | PRN
  Administered 2017-08-27: 100 mL via INTRAVENOUS
  Filled 2017-08-27: qty 100

## 2017-08-27 NOTE — Discharge Instructions (Signed)
These take a clear liquid diet for the next 4 days, then advance to a bland diet as tolerated.  You may take Tylenol or Motrin for mild to moderate pain, and Percocet for severe pain.  Do not drive within 8 hours of taking Percocet.  If you take Percocet, it can worsen your constipation so please also take a stool softener.  Zofran is for nausea and vomiting.  Please follow-up with your primary care physician for reevaluation.  Your primary care physician can also look up the results of your urine study to make sure there is no evidence of infection.  Return to the emergency department if you develop severe pain, fever, inability to keep down fluids, or any other symptoms concerning to you.

## 2017-08-27 NOTE — ED Provider Notes (Signed)
[facility] Emergency Department Provider Note  ____________________________________________  Time seen: Approximately 3:31 PM  I have reviewed the triage vital signs and the nursing notes.   HISTORY  Chief Complaint Back Pain and Abdominal Pain    HPI Kristina Schultz is a 47 y.o. female with a history of ACE inhibitor induced pancreatitis, remote umbilical hernia repair x2, HTN, HL, presenting with epigastric pain, nausea.  The patient reports that for the past 3 days, she has had an intermittent "sharp, throbbing, achy" epigastric pain that radiates to the back associated with nausea but no vomiting.  No associated fevers or chills.  She has had constipation; last bowel movement was 1.5 days ago.  She also reports a distended abdomen.  She has not tried anything for her symptoms.  She denies any alcohol use.  She is no longer treated with ace inhibitors.  Past Medical History:  Diagnosis Date  . Asthma   . Bipolar 1 disorder (HCC)   . Complication of anesthesia    PREFERS NOT TO HAVE GAS IF POSSIBLE/STATES SHE SMELLS AND TASTE IT FOR DAYS  . Fatty liver   . GERD (gastroesophageal reflux disease)   . Hyperlipemia   . Hypertension   . Seizures (HCC) 2017   LAST 2017    Patient Active Problem List   Diagnosis Date Noted  . Incarcerated ventral hernia 02/19/2016    Past Surgical History:  Procedure Laterality Date  . CYST REMOVAL TRUNK    . ESOPHAGOGASTRODUODENOSCOPY (EGD) WITH PROPOFOL N/A 10/17/2016   Procedure: ESOPHAGOGASTRODUODENOSCOPY (EGD) WITH PROPOFOL;  Surgeon: Kieth Brightly, MD;  Location: [facility] ENDOSCOPY;  Service: Endoscopy;  Laterality: N/A;  . VENTRAL HERNIA REPAIR  02/19/2016   Dr Evette Cristal  . VENTRAL HERNIA REPAIR N/A 02/19/2016   Procedure: incarcerated ventral hernia repair;  Surgeon: Kieth Brightly, MD;  Location: [facility] ORS;  Service: General;  Laterality: N/A;  . VENTRAL HERNIA REPAIR N/A 12/07/2016   Procedure:  LAPAROSCOPIC VENTRAL HERNIA WITH MESH;  Surgeon: Kieth Brightly, MD;  Location: [facility] ORS;  Service: General;  Laterality: N/A;    Current Outpatient Rx  . Order #: [id number] Class: Print  . Order #: [id number] Class: Print  . Order #: [id number] Class: Print  . Order #: [id number] Class: Print  . Order #: [id number] Class: Print  . Order #: [id number] Class: Print  . Order #: [id number] Class: Normal  . Order #: [id number] Class: Print  . Order #: [id number] Class: Print  . Order #: [id number] Class: Print  . Order #: [id number] Class: Print  . Order #: [id number] Class: Historical Med  . Order #: [id number] Class: Print    Allergies Atenolol; Lamictal [lamotrigine]; Lisinopril; Losartan; Celexa [citalopram hydrobromide]; Penicillins; Prednisone; and Sulfa antibiotics  Family History  Problem Relation Age of Onset  . Transient ischemic attack Father   . Breast cancer Sister     Social History Social History   Tobacco Use  . Smoking status: Current Every Day Smoker    Packs/day: 0.25    Types: Cigars  . Smokeless tobacco: Never Used  . Tobacco comment: 2 cigars per day  Substance Use Topics  . Alcohol use: No  . Drug use: No    Review of Systems Constitutional: No fever/chills.  No lightheadedness or syncope. Eyes: No visual changes. ENT: No sore throat. No congestion or rhinorrhea. Cardiovascular: Denies chest pain. Denies palpitations. Respiratory: Denies shortness of breath.  No cough. Gastrointestinal: Positive epigastric abdominal pain which radiates to the back.  Positive nausea, no vomiting.  No diarrhea.  No  constipation. Genitourinary: Negative for dysuria. Musculoskeletal: Positive for back pain. Skin: Negative for rash. Neurological: Negative for headaches. No focal numbness, tingling or weakness.     ____________________________________________   PHYSICAL EXAM:  VITAL SIGNS: ED Triage Vitals [08/27/17 1312]  Enc Vitals Group     BP (!) 155/94     Pulse Rate  92     Resp 16     Temp 98.9 F (37.2 C)     Temp Source Oral     SpO2 98 %     Weight 270 lb (122.5 kg)     Height 5\' 11"  (1.803 m)     Head Circumference      Peak Flow      Pain Score 8     Pain Loc      Pain Edu?      Excl. in GC?     Constitutional: Alert and oriented. Well appearing and in no acute distress. Answers questions appropriately. Eyes: Conjunctivae are normal.  EOMI. No scleral icterus. Head: Atraumatic. Nose: No congestion/rhinnorhea. Mouth/Throat: Mucous membranes are mildly dry.  Neck: No stridor.  Supple.   Cardiovascular: Normal rate, regular rhythm. No murmurs, rubs or gallops.  Respiratory: Normal respiratory effort.  No accessory muscle use or retractions. Lungs CTAB.  No wheezes, rales or ronchi. Gastrointestinal: Soft, and nondistended.  Tender to palpation in the epigastrium.  Negative Murphy sign.  No guarding or rebound.  No peritoneal signs. Musculoskeletal: No LE edema. Neurologic:  A&Ox3.  Speech is clear.  Face and smile are symmetric.  EOMI.  Moves all extremities well. Skin:  Skin is warm, dry and intact. No rash noted. Psychiatric: Mood and affect are normal. Speech and behavior are normal.  Normal judgement.  ____________________________________________   LABS (all labs ordered are listed, but only abnormal results are displayed)  Labs Reviewed  LIPASE, BLOOD - Abnormal; Notable for the following components:      Result Value   Lipase 72 (*)    All other components within normal limits  COMPREHENSIVE METABOLIC PANEL - Abnormal; Notable for the following components:   Glucose, Bld 108 (*)    All other components within normal limits  URINALYSIS, COMPLETE (UACMP) WITH MICROSCOPIC - Abnormal; Notable for the following components:   Color, Urine YELLOW (*)    APPearance HAZY (*)    Hgb urine dipstick MODERATE (*)    Protein, ur 100 (*)    Bacteria, UA RARE (*)    Squamous Epithelial / LPF 0-5 (*)    All other components within normal  limits  URINE CULTURE  CBC  TROPONIN I  POC URINE PREG, ED  POCT PREGNANCY, URINE   ____________________________________________  EKG  ED ECG REPORT I, Rockne MenghiniNorman, Anne-Caroline, the attending physician, personally viewed and interpreted this ECG.   Date: 08/27/2017  EKG Time: 1318  Rate: 83  Rhythm: normal sinus rhythm  Axis: nl   Intervals:none  ST&T Change: No STEMI  ____________________________________________  RADIOLOGY  Ct Abdomen Pelvis W Contrast  Result Date: 08/27/2017 CLINICAL DATA:  Acute epigastric abdominal pain with nausea. History of ventral abdominal hernia repair 12/07/2016. EXAM: CT ABDOMEN AND PELVIS WITH CONTRAST TECHNIQUE: Multidetector CT imaging of the abdomen and pelvis was performed using the standard protocol following bolus administration of intravenous contrast. CONTRAST:  100mL ISOVUE-300 IOPAMIDOL (ISOVUE-300) INJECTION 61% COMPARISON:  05/15/2017 abdominal radiographs. 09/20/2016 CT abdomen/pelvis. FINDINGS: Lower chest: Solid 5 mm basilar right lower lobe pulmonary nodule (series 4/image 10), stable since 09/20/2016 CT, considered benign.  No acute abnormality at the lung bases. Hepatobiliary: Normal liver size. No liver mass. Normal gallbladder with no radiopaque cholelithiasis. No biliary ductal dilatation. Pancreas: There is new mild haziness of peripancreatic fat in the pancreatic head, neck and body. No pancreatic mass. No peripancreatic fluid collection. No pancreatic duct dilation. Spleen: Normal size. No mass. Adrenals/Urinary Tract: Normal adrenals. Normal kidneys with no hydronephrosis and no renal mass. Normal bladder. Stomach/Bowel: Small hiatal hernia. Otherwise normal nondistended stomach. Normal caliber small bowel with no small bowel wall thickening. Normal appendix. Mild left colonic diverticulosis, with no large bowel wall thickening or pericolonic fat stranding. Vascular/Lymphatic: Atherosclerotic nonaneurysmal abdominal aorta. Patent portal,  splenic, hepatic and renal veins. No pathologically enlarged lymph nodes in the abdomen or pelvis. Reproductive: Grossly normal uterus.  No adnexal mass. Other: No pneumoperitoneum, ascites or focal fluid collection. Mild diastasis at the supraumbilical ventral abdominal hernia mesh repair site, without recurrent hernia. Musculoskeletal: No aggressive appearing focal osseous lesions. Mild thoracolumbar spondylosis. IMPRESSION: 1. Mild acute uncomplicated pancreatitis. 2. Mild diastasis at the supraumbilical ventral abdominal hernia repair site, without recurrent ventral hernia. 3. Small hiatal hernia. 4. Mild left colonic diverticulosis. 5.  Aortic Atherosclerosis (ICD10-I70.0). Electronically Signed   By: Delbert Phenix M.D.   On: 08/27/2017 16:10    ____________________________________________   PROCEDURES  Procedure(s) performed: None  Procedures  Critical Care performed: No ____________________________________________   INITIAL IMPRESSION / ASSESSMENT AND PLAN / ED COURSE  Pertinent labs & imaging results that were available during my care of the patient were reviewed by me and considered in my medical decision making (see chart for details).  47 y.o. female with history of ACE inhibitor induced pancreatitis, remote hernia repair x2, presenting with epigastric abdominal pain, abdominal distention, constipation and nausea without vomiting.  Overall, the patient is hemodynamically stable and afebrile.  She does have epigastric discomfort and an elevated lipase, consistent with an acute exacerbation of pancreatitis.  We will get a CT scan to evaluate for any complications.  We will also look for ileus or small bowel obstruction although this is less likely.  I have spoken to the patient about the likelihood that she will be able to treat her pancreatitis with conservative management at home, dietary modifications and symptom control, and follow-up with her PCP if the rest of her workup in the  emergency department is reassuring.  For final disposition.   ----------------------------------------- 4:15 PM on 08/27/2017 -----------------------------------------  The patient remains afebrile and hemodynamically stable, and her symptoms have significantly improved since her symptomatic treatment.  She is able to tolerate liquids without vomiting.  Her CT scan shows uncomplicated pancreatitis.  She does have a hiatal hernia, as well as an abdominal wall diastasis without any involvement of bowel.  At this time, the patient is safe for discharge home.  I have talked to her about follow-up instructions as well as return precautions.  ____________________________________________  FINAL CLINICAL IMPRESSION(S) / ED DIAGNOSES  Final diagnoses:  Bacteriuria  Acute pancreatitis without infection or necrosis, unspecified pancreatitis type         NEW MEDICATIONS STARTED DURING THIS VISIT:  Current Discharge Medication List    START taking these medications   Details  ondansetron (ZOFRAN ODT) 4 MG disintegrating tablet Take 1 tablet (4 mg total) by mouth every 8 (eight) hours as needed for nausea or vomiting. Qty: 20 tablet, Refills: 0    oxyCODONE-acetaminophen (PERCOCET) 5-325 MG tablet Take 1 tablet by mouth every 4 (four) hours as needed for severe  pain. Qty: 10 tablet, Refills: 0    senna-docusate (SENOKOT-S) 8.6-50 MG tablet Take 1 tablet by mouth daily as needed for up to 15 doses for mild constipation or moderate constipation. Qty: 15 tablet, Refills: 0          Rockne Menghini, MD 08/27/17 1616

## 2017-08-27 NOTE — ED Triage Notes (Addendum)
Pt to ED reporting pain in the middle left side of her back and reports pain is intermittent after a hernia repair but has worsened over the past 3 days. Pt also reporting centralized upper abd pain with nausea. No vomiting or diarrhea reported and no fevers.   Pt reports worsening constipation and bloating as well.

## 2017-08-29 LAB — URINE CULTURE: Culture: 20000 — AB

## 2017-09-24 DIAGNOSIS — K432 Incisional hernia without obstruction or gangrene: Secondary | ICD-10-CM

## 2017-09-26 ENCOUNTER — Emergency Department
Admission: EM | Admit: 2017-09-26 | Discharge: 2017-09-26 | Disposition: A | Discharge status: Home / Self Care | Payer: Medicaid Other | Attending: Emergency Medicine | Admitting: Emergency Medicine

## 2017-09-26 ENCOUNTER — Encounter: Payer: Self-pay | Admitting: Emergency Medicine

## 2017-09-26 DIAGNOSIS — R109 Unspecified abdominal pain: Secondary | ICD-10-CM | POA: Insufficient documentation

## 2017-09-26 DIAGNOSIS — Z5321 Procedure and treatment not carried out due to patient leaving prior to being seen by health care provider: Secondary | ICD-10-CM | POA: Insufficient documentation

## 2017-09-26 LAB — URINALYSIS, COMPLETE (UACMP) WITH MICROSCOPIC
Bilirubin Urine: NEGATIVE
GLUCOSE, UA: NEGATIVE mg/dL
Ketones, ur: NEGATIVE mg/dL
Leukocytes, UA: NEGATIVE
Nitrite: NEGATIVE
PROTEIN: NEGATIVE mg/dL
SPECIFIC GRAVITY, URINE: 1.011 (ref 1.005–1.030)
pH: 7 (ref 5.0–8.0)

## 2017-09-26 LAB — CBC
HCT: 36.9 % (ref 35.0–47.0)
Hemoglobin: 12.7 g/dL (ref 12.0–16.0)
MCH: 30.4 pg (ref 26.0–34.0)
MCHC: 34.5 g/dL (ref 32.0–36.0)
MCV: 88 fL (ref 80.0–100.0)
PLATELETS: 263 10*3/uL (ref 150–440)
RBC: 4.19 MIL/uL (ref 3.80–5.20)
RDW: 13 % (ref 11.5–14.5)
WBC: 6.1 10*3/uL (ref 3.6–11.0)

## 2017-09-26 LAB — COMPREHENSIVE METABOLIC PANEL
ALBUMIN: 3.9 g/dL (ref 3.5–5.0)
ALK PHOS: 95 U/L (ref 38–126)
ALT: 14 U/L (ref 14–54)
AST: 21 U/L (ref 15–41)
Anion gap: 6 (ref 5–15)
BUN: 9 mg/dL (ref 6–20)
CHLORIDE: 100 mmol/L — AB (ref 101–111)
CO2: 25 mmol/L (ref 22–32)
CREATININE: 0.87 mg/dL (ref 0.44–1.00)
Calcium: 8.8 mg/dL — ABNORMAL LOW (ref 8.9–10.3)
GFR calc non Af Amer: >60 mL/min (ref >60)
GLUCOSE: 98 mg/dL (ref 65–99)
Potassium: 3.8 mmol/L (ref 3.5–5.1)
SODIUM: 131 mmol/L — AB (ref 135–145)
Total Bilirubin: 0.5 mg/dL (ref 0.3–1.2)
Total Protein: 7.5 g/dL (ref 6.5–8.1)

## 2017-09-26 LAB — LIPASE, BLOOD: LIPASE: 57 U/L — AB (ref 11–51)

## 2017-09-26 LAB — PREGNANCY, URINE: PREG TEST UR: NEGATIVE

## 2017-09-26 NOTE — ED Triage Notes (Signed)
Pt to ED with c/o of abdominal pain that has been ongoing for approx 1 month. Pt has hx of 2 hernia surgeries. Pt denies N/V/D. Pt states dizziness.

## 2017-12-14 DIAGNOSIS — F319 Bipolar disorder, unspecified: Secondary | ICD-10-CM | POA: Insufficient documentation

## 2018-03-25 ENCOUNTER — Other Ambulatory Visit: Payer: Self-pay | Admitting: Family Medicine

## 2018-03-25 DIAGNOSIS — N83209 Unspecified ovarian cyst, unspecified side: Secondary | ICD-10-CM

## 2018-04-01 ENCOUNTER — Ambulatory Visit
Admission: RE | Admit: 2018-04-01 | Discharge: 2018-04-01 | Disposition: A | Discharge status: Home / Self Care | Payer: BLUE CROSS/BLUE SHIELD | Source: Ambulatory Visit | Attending: Family Medicine | Admitting: Family Medicine

## 2018-04-01 ENCOUNTER — Other Ambulatory Visit: Payer: Self-pay | Admitting: Family Medicine

## 2018-04-01 DIAGNOSIS — N83201 Unspecified ovarian cyst, right side: Secondary | ICD-10-CM | POA: Insufficient documentation

## 2018-04-01 DIAGNOSIS — N83209 Unspecified ovarian cyst, unspecified side: Secondary | ICD-10-CM | POA: Diagnosis present

## 2018-04-02 DIAGNOSIS — M6208 Separation of muscle (nontraumatic), other site: Secondary | ICD-10-CM | POA: Insufficient documentation

## 2018-04-02 DIAGNOSIS — Z8719 Personal history of other diseases of the digestive system: Secondary | ICD-10-CM | POA: Insufficient documentation

## 2018-04-02 DIAGNOSIS — K760 Fatty (change of) liver, not elsewhere classified: Secondary | ICD-10-CM | POA: Insufficient documentation

## 2018-04-02 DIAGNOSIS — K449 Diaphragmatic hernia without obstruction or gangrene: Secondary | ICD-10-CM | POA: Insufficient documentation

## 2018-04-02 DIAGNOSIS — I700 Atherosclerosis of aorta: Secondary | ICD-10-CM | POA: Insufficient documentation

## 2018-04-02 DIAGNOSIS — K5730 Diverticulosis of large intestine without perforation or abscess without bleeding: Secondary | ICD-10-CM | POA: Insufficient documentation

## 2018-07-24 ENCOUNTER — Other Ambulatory Visit: Payer: Self-pay

## 2018-07-24 ENCOUNTER — Encounter: Payer: Self-pay | Admitting: Emergency Medicine

## 2018-07-24 ENCOUNTER — Emergency Department
Admission: EM | Admit: 2018-07-24 | Discharge: 2018-07-24 | Disposition: A | Discharge status: Home / Self Care | Payer: BLUE CROSS/BLUE SHIELD | Attending: Emergency Medicine | Admitting: Emergency Medicine

## 2018-07-24 DIAGNOSIS — Z79899 Other long term (current) drug therapy: Secondary | ICD-10-CM | POA: Insufficient documentation

## 2018-07-24 DIAGNOSIS — L0291 Cutaneous abscess, unspecified: Secondary | ICD-10-CM

## 2018-07-24 DIAGNOSIS — L02411 Cutaneous abscess of right axilla: Secondary | ICD-10-CM | POA: Insufficient documentation

## 2018-07-24 DIAGNOSIS — I10 Essential (primary) hypertension: Secondary | ICD-10-CM | POA: Insufficient documentation

## 2018-07-24 DIAGNOSIS — F17210 Nicotine dependence, cigarettes, uncomplicated: Secondary | ICD-10-CM | POA: Insufficient documentation

## 2018-07-24 MED ORDER — LIDOCAINE HCL (PF) 1 % IJ SOLN
5.0000 mL | Freq: Once | INTRAMUSCULAR | Status: AC
  Administered 2018-07-24: 5 mL via INTRADERMAL
  Filled 2018-07-24: qty 5

## 2018-07-24 NOTE — ED Provider Notes (Signed)
[facility] Emergency Department Provider Note  ____________________________________________  Time seen: Approximately 6:19 PM  I have reviewed the triage vital signs and the nursing notes.   HISTORY  Chief Complaint Abscess    HPI Kristina Schultz is a 48 y.o. female that presents to the emergency department for evaluation of abscess to right axilla for 3 days.  Patient has been applying garlic to the area in hopes to bring it to a head.  She has been taking doxycycline from [facility] for 2 days. No fever.  Past Medical History:  Diagnosis Date  . Asthma   . Bipolar 1 disorder (HCC)   . Complication of anesthesia    PREFERS NOT TO HAVE GAS IF POSSIBLE/STATES SHE SMELLS AND TASTE IT FOR DAYS  . Fatty liver   . GERD (gastroesophageal reflux disease)   . Hyperlipemia   . Hypertension   . Seizures (HCC) 2017   LAST 2017    Patient Active Problem List   Diagnosis Date Noted  . Incarcerated ventral hernia 02/19/2016    Past Surgical History:  Procedure Laterality Date  . CYST REMOVAL TRUNK    . ESOPHAGOGASTRODUODENOSCOPY (EGD) WITH PROPOFOL N/A 10/17/2016   Procedure: ESOPHAGOGASTRODUODENOSCOPY (EGD) WITH PROPOFOL;  Surgeon: Kieth Brightly, MD;  Location: [facility] ENDOSCOPY;  Service: Endoscopy;  Laterality: N/A;  . VENTRAL HERNIA REPAIR  02/19/2016   Dr Evette Cristal  . VENTRAL HERNIA REPAIR N/A 02/19/2016   Procedure: incarcerated ventral hernia repair;  Surgeon: Kieth Brightly, MD;  Location: [facility] ORS;  Service: General;  Laterality: N/A;  . VENTRAL HERNIA REPAIR N/A 12/07/2016   Procedure: LAPAROSCOPIC VENTRAL HERNIA WITH MESH;  Surgeon: Kieth Brightly, MD;  Location: [facility] ORS;  Service: General;  Laterality: N/A;    Prior to Admission medications   Medication Sig Start Date End Date Taking? Authorizing Provider  albuterol (PROVENTIL HFA;VENTOLIN HFA) 108 (90 BASE) MCG/ACT inhaler Inhale 2 puffs into the lungs every 6 (six) hours as  needed for wheezing or shortness of breath. 03/06/15   Jene Every, MD  famotidine (PEPCID) 20 MG tablet Take 1 tablet (20 mg total) by mouth 2 (two) times daily for 5 days. 06/15/17 06/20/17  Orvil Feil, PA-C  levETIRAcetam (KEPPRA) 750 MG tablet Take 1 tablet (750 mg total) by mouth 2 (two) times daily. Patient taking differently: Take 375 mg by mouth at bedtime.  04/26/16 05/15/17  Willy Eddy, MD  polyethylene glycol [facility] / Ethelene Hal) packet Take 17 g by mouth daily. 05/15/17   Emily Filbert, MD  senna-docusate (SENOKOT-S) 8.6-50 MG tablet Take 1 tablet by mouth daily as needed for up to 15 doses for mild constipation or moderate constipation. 08/27/17   Rockne Menghini, MD  sertraline (ZOLOFT) 50 MG tablet Take 50 mg by mouth at bedtime.    [provider]  triamcinolone ointment (KENALOG) 0.5 % Apply 1 application topically 2 (two) times daily. 01/23/15   Triplett, Rulon Eisenmenger B, FNP    Allergies Atenolol; Lamictal [lamotrigine]; Lisinopril; Losartan; Celexa [citalopram hydrobromide]; Penicillins; Prednisone; and Sulfa antibiotics  Family History  Problem Relation Age of Onset  . Transient ischemic attack Father   . Breast cancer Sister     Social History Social History   Tobacco Use  . Smoking status: Current Every Day Smoker    Packs/day: 0.25    Types: Cigars  . Smokeless tobacco: Never Used  . Tobacco comment: 2 cigars per day  Substance Use Topics  . Alcohol use: No  .  Drug use: No     Review of Systems  Constitutional: No fever/chills Gastrointestinal: No nausea, no vomiting.  Musculoskeletal: Negative for musculoskeletal pain. Skin: Negative for abrasions, lacerations, ecchymosis. Positive for rash.    ____________________________________________   PHYSICAL EXAM:  VITAL SIGNS: ED Triage Vitals  Enc Vitals Group     BP 07/24/18 1804 (!) 160/108     Pulse Rate 07/24/18 1804 95     Resp 07/24/18 1804 16     Temp 07/24/18 1804  98.6 F (37 C)     Temp Source 07/24/18 1804 Oral     SpO2 07/24/18 1804 98 %     Weight 07/24/18 1808 268 lb 15.4 oz (122 kg)     Height --      Head Circumference --      Peak Flow --      Pain Score 07/24/18 1805 8     Pain Loc --      Pain Edu? --      Excl. in GC? --      Constitutional: Alert and oriented. Well appearing and in no acute distress. Eyes: Conjunctivae are normal. PERRL. EOMI. Head: Atraumatic. ENT:      Ears:      Nose: No congestion/rhinnorhea.      Mouth/Throat: Mucous membranes are moist.  Neck: No stridor.  Cardiovascular: Normal rate, regular rhythm.  Good peripheral circulation. Respiratory: Normal respiratory effort without tachypnea or retractions. Lungs CTAB. Good air entry to the bases with no decreased or absent breath sounds. Musculoskeletal: Full range of motion to all extremities. No gross deformities appreciated. Neurologic:  Normal speech and language. No gross focal neurologic deficits are appreciated.  Skin:  Skin is warm, dry and intact. 2 cm by 2 cm area of swelling and fluctuance to left axilla.  Psychiatric: Mood and affect are normal. Speech and behavior are normal. Patient exhibits appropriate insight and judgement.   ____________________________________________   LABS (all labs ordered are listed, but only abnormal results are displayed)  Labs Reviewed - No data to display ____________________________________________  EKG   ____________________________________________  RADIOLOGY   No results found.  ____________________________________________    PROCEDURES  Procedure(s) performed:    Procedures  INCISION AND DRAINAGE Performed by: Enid Derry Consent: Verbal consent obtained. Risks and benefits: risks, benefits and alternatives were discussed Type: abscess  Body area: axilla  Anesthesia: local infiltration  Incision was made with a scalpel.  Local anesthetic: lidocaine 1 % without  epinephrine  Anesthetic total: 3 ml  Complexity: complex Blunt dissection to break up loculations  Drainage: purulent  Drainage amount: 2 ccs  Packing material: 1/4 in iodoform gauze  Patient tolerance: Patient tolerated the procedure well with no immediate complications.    Medications  lidocaine (PF) (XYLOCAINE) 1 % injection 5 mL (5 mLs Intradermal Given by Other 07/24/18 1905)     ____________________________________________   INITIAL IMPRESSION / ASSESSMENT AND PLAN / ED COURSE  Pertinent labs & imaging results that were available during my care of the patient were reviewed by me and considered in my medical decision making (see chart for details).  Review of the [place] CSRS was performed in accordance of the [facility] prior to dispensing any controlled drugs.     Patient's diagnosis is consistent with abscess. Abscess was drained and packed in the ED. Patient has already been started on a prescription for doxycycline and will continue prescription. Patient is to follow up with ED as directed. She will return in 2 days  for recheck. Patient is given ED precautions to return to the ED for any worsening or new symptoms.     ____________________________________________  FINAL CLINICAL IMPRESSION(S) / ED DIAGNOSES  Final diagnoses:  Abscess      NEW MEDICATIONS STARTED DURING THIS VISIT:  ED Discharge Orders    None          This chart was dictated using voice recognition software/Dragon. Despite best efforts to proofread, errors can occur which can change the meaning. Any change was purely unintentional.    Enid Derry, PA-C 07/25/18 9826    Sharyn Creamer, MD 07/26/18 2135

## 2018-07-24 NOTE — ED Triage Notes (Signed)
Pt in via POV with complaints of a cyst to right axilla x 3 days w/ worsening pain.  Pt denies any exudate from area.  Ambulatory to triage, NAD noted at this time.

## 2018-07-24 NOTE — ED Notes (Signed)
See triage note  Presents with possible abscess area under right arm    States she developed this about 3 days   Area is getting worse

## 2018-07-26 ENCOUNTER — Emergency Department
Admission: EM | Admit: 2018-07-26 | Discharge: 2018-07-26 | Disposition: A | Discharge status: Home / Self Care | Payer: Medicaid Other | Attending: Emergency Medicine | Admitting: Emergency Medicine

## 2018-07-26 DIAGNOSIS — J45909 Unspecified asthma, uncomplicated: Secondary | ICD-10-CM | POA: Insufficient documentation

## 2018-07-26 DIAGNOSIS — Z5189 Encounter for other specified aftercare: Secondary | ICD-10-CM

## 2018-07-26 DIAGNOSIS — Z4800 Encounter for change or removal of nonsurgical wound dressing: Secondary | ICD-10-CM | POA: Insufficient documentation

## 2018-07-26 DIAGNOSIS — Z79899 Other long term (current) drug therapy: Secondary | ICD-10-CM | POA: Insufficient documentation

## 2018-07-26 DIAGNOSIS — I10 Essential (primary) hypertension: Secondary | ICD-10-CM | POA: Insufficient documentation

## 2018-07-26 DIAGNOSIS — L02411 Cutaneous abscess of right axilla: Secondary | ICD-10-CM | POA: Insufficient documentation

## 2018-07-26 DIAGNOSIS — F17290 Nicotine dependence, other tobacco product, uncomplicated: Secondary | ICD-10-CM | POA: Insufficient documentation

## 2018-07-26 MED ORDER — CLINDAMYCIN HCL 150 MG PO CAPS
300.0000 mg | ORAL_CAPSULE | Freq: Once | ORAL | Status: AC
  Administered 2018-07-26: 300 mg via ORAL
  Filled 2018-07-26: qty 2

## 2018-07-26 MED ORDER — CLINDAMYCIN HCL 300 MG PO CAPS: 300.0000 mg | ORAL_CAPSULE | Freq: Three times a day (TID) | ORAL | 0 refills | Status: AC

## 2018-07-26 NOTE — ED Provider Notes (Signed)
[facility] Emergency Department Provider Note  ____________________________________________  Time seen: Approximately 12:02 PM  I have reviewed the triage vital signs and the nursing notes.   HISTORY  Chief Complaint Wound Check    HPI Kristina Schultz is a 48 y.o. female that presents to the emergency department for evaluation of wound recheck.  Patient had abscess drained by me 2 days ago.  Patient is here for packing removal and repacking.  Patient states that pain has significantly improved.  She did not have any pain in area when she woke up this morning.  Area is continuing to drain yellow discharge. She is taking doxycycline. No fevers, chills.   Past Medical History:  Diagnosis Date  . Asthma   . Bipolar 1 disorder (HCC)   . Complication of anesthesia    PREFERS NOT TO HAVE GAS IF POSSIBLE/STATES SHE SMELLS AND TASTE IT FOR DAYS  . Fatty liver   . GERD (gastroesophageal reflux disease)   . Hyperlipemia   . Hypertension   . Seizures (HCC) 2017   LAST 2017    Patient Active Problem List   Diagnosis Date Noted  . Incarcerated ventral hernia 02/19/2016    Past Surgical History:  Procedure Laterality Date  . CYST REMOVAL TRUNK    . ESOPHAGOGASTRODUODENOSCOPY (EGD) WITH PROPOFOL N/A 10/17/2016   Procedure: ESOPHAGOGASTRODUODENOSCOPY (EGD) WITH PROPOFOL;  Surgeon: Kieth Brightly, MD;  Location: [facility] ENDOSCOPY;  Service: Endoscopy;  Laterality: N/A;  . VENTRAL HERNIA REPAIR  02/19/2016   Dr Evette Cristal  . VENTRAL HERNIA REPAIR N/A 02/19/2016   Procedure: incarcerated ventral hernia repair;  Surgeon: Kieth Brightly, MD;  Location: [facility] ORS;  Service: General;  Laterality: N/A;  . VENTRAL HERNIA REPAIR N/A 12/07/2016   Procedure: LAPAROSCOPIC VENTRAL HERNIA WITH MESH;  Surgeon: Kieth Brightly, MD;  Location: [facility] ORS;  Service: General;  Laterality: N/A;    Prior to Admission medications   Medication Sig Start Date End Date Taking?  Authorizing Provider  albuterol (PROVENTIL HFA;VENTOLIN HFA) 108 (90 BASE) MCG/ACT inhaler Inhale 2 puffs into the lungs every 6 (six) hours as needed for wheezing or shortness of breath. 03/06/15   Jene Every, MD  clindamycin (CLEOCIN) 300 MG capsule Take 1 capsule (300 mg total) by mouth 3 (three) times daily for 10 days. 07/26/18 08/05/18  Enid Derry, PA-C  famotidine (PEPCID) 20 MG tablet Take 1 tablet (20 mg total) by mouth 2 (two) times daily for 5 days. 06/15/17 06/20/17  Orvil Feil, PA-C  levETIRAcetam (KEPPRA) 750 MG tablet Take 1 tablet (750 mg total) by mouth 2 (two) times daily. Patient taking differently: Take 375 mg by mouth at bedtime.  04/26/16 05/15/17  Willy Eddy, MD  polyethylene glycol [facility] / Ethelene Hal) packet Take 17 g by mouth daily. 05/15/17   Emily Filbert, MD  senna-docusate (SENOKOT-S) 8.6-50 MG tablet Take 1 tablet by mouth daily as needed for up to 15 doses for mild constipation or moderate constipation. 08/27/17   Rockne Menghini, MD  sertraline (ZOLOFT) 50 MG tablet Take 50 mg by mouth at bedtime.    [provider]  triamcinolone ointment (KENALOG) 0.5 % Apply 1 application topically 2 (two) times daily. 01/23/15   Triplett, Rulon Eisenmenger B, FNP    Allergies Atenolol; Lamictal [lamotrigine]; Lisinopril; Losartan; Celexa [citalopram hydrobromide]; Penicillins; Prednisone; and Sulfa antibiotics  Family History  Problem Relation Age of Onset  . Transient ischemic attack Father   . Breast cancer Sister  Social History Social History   Tobacco Use  . Smoking status: Current Every Day Smoker    Packs/day: 0.25    Types: Cigars  . Smokeless tobacco: Never Used  . Tobacco comment: 2 cigars per day  Substance Use Topics  . Alcohol use: No  . Drug use: No     Review of Systems  Constitutional: No fever/chills Respiratory: No SOB. Gastrointestinal: No nausea, no vomiting.  Musculoskeletal: Negative for musculoskeletal  pain. Skin: Negative for abrasions, lacerations, ecchymosis. Positive for rash.    ____________________________________________   PHYSICAL EXAM:  VITAL SIGNS: ED Triage Vitals  Enc Vitals Group     BP 07/26/18 1144 123/90     Pulse Rate 07/26/18 1144 (!) 101     Resp 07/26/18 1144 17     Temp 07/26/18 1144 97.7 F (36.5 C)     Temp Source 07/26/18 1144 Oral     SpO2 07/26/18 1144 97 %     Weight 07/26/18 1145 274 lb (124.3 kg)     Height 07/26/18 1145 5\' 11"  (1.803 m)     Head Circumference --      Peak Flow --      Pain Score 07/26/18 1146 0     Pain Loc --      Pain Edu? --      Excl. in GC? --      Constitutional: Alert and oriented. Well appearing and in no acute distress. Eyes: Conjunctivae are normal. PERRL. EOMI. Head: Atraumatic. ENT:      Ears:      Nose: No congestion/rhinnorhea.      Mouth/Throat: Mucous membranes are moist.  Neck: No stridor. Cardiovascular: Normal rate, regular rhythm.  Good peripheral circulation. Respiratory: Normal respiratory effort without tachypnea or retractions. Lungs CTAB. Good air entry to the bases with no decreased or absent breath sounds. Musculoskeletal: Full range of motion to all extremities. No gross deformities appreciated. Neurologic:  Normal speech and language. No gross focal neurologic deficits are appreciated.  Skin:  Skin is warm, dry. 2 cm by 2 cm area of induration to right axilla with packing in place. No surrounding erythema. No streaking. Psychiatric: Mood and affect are normal. Speech and behavior are normal. Patient exhibits appropriate insight and judgement.   ____________________________________________   LABS (all labs ordered are listed, but only abnormal results are displayed)  Labs Reviewed - No data to display ____________________________________________  EKG   ____________________________________________  RADIOLOGY   No results  found.  ____________________________________________    PROCEDURES  Procedure(s) performed:    Procedures    Medications  clindamycin (CLEOCIN) capsule 300 mg (300 mg Oral Given 07/26/18 1237)     ____________________________________________   INITIAL IMPRESSION / ASSESSMENT AND PLAN / ED COURSE  Pertinent labs & imaging results that were available during my care of the patient were reviewed by me and considered in my medical decision making (see chart for details).  Review of the [place] CSRS was performed in accordance of the [facility] prior to dispensing any controlled drugs.   Patient presented to the emergency department for wound recheck. Vital signs and exam are reassuring. Abscess was repacked. Abscess has improved since 2 days prior.  Area is nontender.  Abscess is still draining yellow fluid.  Patient has been taking doxycycline that she received from urgent care 4 days ago.  Her antibiotic will be switched to clindamycin.  Patient will be discharged home with prescriptions for clindamycin.  She will return to the emergency department in  2 days for recheck.  Patient is given ED precautions to return to the ED for any worsening or new symptoms.     ____________________________________________  FINAL CLINICAL IMPRESSION(S) / ED DIAGNOSES  Final diagnoses:  Wound check, abscess      NEW MEDICATIONS STARTED DURING THIS VISIT:  ED Discharge Orders         Ordered    clindamycin (CLEOCIN) 300 MG capsule  3 times daily     07/26/18 1221              This chart was dictated using voice recognition software/Dragon. Despite best efforts to proofread, errors can occur which can change the meaning. Any change was purely unintentional.    Enid Derry, PA-C 07/26/18 1259    Jene Every, MD 07/26/18 [phone]

## 2018-07-26 NOTE — ED Triage Notes (Signed)
Pt presents via POV for wound recheck and packing change.

## 2018-07-28 ENCOUNTER — Encounter: Payer: Self-pay | Admitting: Emergency Medicine

## 2018-07-28 ENCOUNTER — Emergency Department
Admission: EM | Admit: 2018-07-28 | Discharge: 2018-07-28 | Disposition: A | Discharge status: Home / Self Care | Payer: Medicaid Other | Attending: Emergency Medicine | Admitting: Emergency Medicine

## 2018-07-28 DIAGNOSIS — I10 Essential (primary) hypertension: Secondary | ICD-10-CM | POA: Diagnosis not present

## 2018-07-28 DIAGNOSIS — F17290 Nicotine dependence, other tobacco product, uncomplicated: Secondary | ICD-10-CM | POA: Diagnosis not present

## 2018-07-28 DIAGNOSIS — J45909 Unspecified asthma, uncomplicated: Secondary | ICD-10-CM | POA: Insufficient documentation

## 2018-07-28 DIAGNOSIS — Z5189 Encounter for other specified aftercare: Secondary | ICD-10-CM | POA: Insufficient documentation

## 2018-07-28 DIAGNOSIS — Z79899 Other long term (current) drug therapy: Secondary | ICD-10-CM | POA: Diagnosis not present

## 2018-07-28 DIAGNOSIS — L02411 Cutaneous abscess of right axilla: Secondary | ICD-10-CM | POA: Insufficient documentation

## 2018-07-28 NOTE — ED Provider Notes (Signed)
[facility] Emergency Department Provider Note   ____________________________________________   First MD Initiated Contact with Patient 07/28/18 1030     (approximate)  I have reviewed the triage vital signs and the nursing notes.   HISTORY  Chief Complaint Wound Check    HPI Kristina Schultz is a 48 y.o. female patient presents with for wound check secondary to an incision and drainage of right axillary.  Patient seen this facility 2 days ago and was changed from doxycycline to clindamycin.  Patient states decreased pain and swelling in the last 24 hours.  Patient states currently no pain at this time.      Past Medical History:  Diagnosis Date  . Asthma   . Bipolar 1 disorder (HCC)   . Complication of anesthesia    PREFERS NOT TO HAVE GAS IF POSSIBLE/STATES SHE SMELLS AND TASTE IT FOR DAYS  . Fatty liver   . GERD (gastroesophageal reflux disease)   . Hyperlipemia   . Hypertension   . Seizures (HCC) 2017   LAST 2017    Patient Active Problem List   Diagnosis Date Noted  . Incarcerated ventral hernia 02/19/2016    Past Surgical History:  Procedure Laterality Date  . CYST REMOVAL TRUNK    . ESOPHAGOGASTRODUODENOSCOPY (EGD) WITH PROPOFOL N/A 10/17/2016   Procedure: ESOPHAGOGASTRODUODENOSCOPY (EGD) WITH PROPOFOL;  Surgeon: Kieth Brightly, MD;  Location: [facility] ENDOSCOPY;  Service: Endoscopy;  Laterality: N/A;  . VENTRAL HERNIA REPAIR  02/19/2016   Dr Evette Cristal  . VENTRAL HERNIA REPAIR N/A 02/19/2016   Procedure: incarcerated ventral hernia repair;  Surgeon: Kieth Brightly, MD;  Location: [facility] ORS;  Service: General;  Laterality: N/A;  . VENTRAL HERNIA REPAIR N/A 12/07/2016   Procedure: LAPAROSCOPIC VENTRAL HERNIA WITH MESH;  Surgeon: Kieth Brightly, MD;  Location: [facility] ORS;  Service: General;  Laterality: N/A;    Prior to Admission medications   Medication Sig Start Date End Date Taking? Authorizing Provider  albuterol  (PROVENTIL HFA;VENTOLIN HFA) 108 (90 BASE) MCG/ACT inhaler Inhale 2 puffs into the lungs every 6 (six) hours as needed for wheezing or shortness of breath. 03/06/15   Jene Every, MD  clindamycin (CLEOCIN) 300 MG capsule Take 1 capsule (300 mg total) by mouth 3 (three) times daily for 10 days. 07/26/18 08/05/18  Enid Derry, PA-C  famotidine (PEPCID) 20 MG tablet Take 1 tablet (20 mg total) by mouth 2 (two) times daily for 5 days. 06/15/17 06/20/17  Orvil Feil, PA-C  levETIRAcetam (KEPPRA) 750 MG tablet Take 1 tablet (750 mg total) by mouth 2 (two) times daily. Patient taking differently: Take 375 mg by mouth at bedtime.  04/26/16 05/15/17  Willy Eddy, MD  polyethylene glycol [facility] / Ethelene Hal) packet Take 17 g by mouth daily. 05/15/17   Emily Filbert, MD  senna-docusate (SENOKOT-S) 8.6-50 MG tablet Take 1 tablet by mouth daily as needed for up to 15 doses for mild constipation or moderate constipation. 08/27/17   Rockne Menghini, MD  sertraline (ZOLOFT) 50 MG tablet Take 50 mg by mouth at bedtime.    [provider]  triamcinolone ointment (KENALOG) 0.5 % Apply 1 application topically 2 (two) times daily. 01/23/15   Triplett, Rulon Eisenmenger B, FNP    Allergies Atenolol; Lamictal [lamotrigine]; Lisinopril; Losartan; Celexa [citalopram hydrobromide]; Penicillins; Prednisone; and Sulfa antibiotics  Family History  Problem Relation Age of Onset  . Transient ischemic attack Father   . Breast cancer Sister     Social History Social  History   Tobacco Use  . Smoking status: Current Every Day Smoker    Packs/day: 0.25    Types: Cigars  . Smokeless tobacco: Never Used  . Tobacco comment: 2 cigars per day  Substance Use Topics  . Alcohol use: No  . Drug use: No    Review of Systems Constitutional: No fever/chills Eyes: No visual changes. ENT: No sore throat. Cardiovascular: Denies chest pain. Respiratory: Denies shortness of breath. Gastrointestinal: No abdominal  pain.  No nausea, no vomiting.  No diarrhea.  No constipation. Genitourinary: Negative for dysuria. Musculoskeletal: Negative for back pain. Skin: Negative for rash.  Healing left axillary abscess Neurological: Negative for headaches, focal weakness or numbness. Psychiatric:  Bipolar. Endocrine:  Hyperlipidemia hypertension. Allergic/Immunilogical: See extensive allergy list. ____________________________________________   PHYSICAL EXAM:  VITAL SIGNS: ED Triage Vitals  Enc Vitals Group     BP 07/28/18 0929 (!) 124/102     Pulse Rate 07/28/18 0929 87     Resp 07/28/18 0929 18     Temp 07/28/18 0929 97.8 F (36.6 C)     Temp Source 07/28/18 0929 Oral     SpO2 07/28/18 0929 98 %     Weight 07/28/18 0930 274 lb (124.3 kg)     Height 07/28/18 0930 5\' 11"  (1.803 m)     Head Circumference --      Peak Flow --      Pain Score 07/28/18 0930 0     Pain Loc --      Pain Edu? --      Excl. in GC? --     Constitutional: Alert and oriented. Well appearing and in no acute distress. Cardiovascular: Normal rate, regular rhythm. Grossly normal heart sounds.  Good peripheral circulation. Respiratory: Normal respiratory effort.  No retractions. Lungs CTAB. Musculoskeletal: No lower extremity tenderness nor edema.  No joint effusions. Neurologic:  Normal speech and language. No gross focal neurologic deficits are appreciated. No gait instability. Skin:  Skin is warm, dry and intact. No rash noted.  Decreased erythema and erythema.  No purulent drainage from incision site. Psychiatric: Mood and affect are normal. Speech and behavior are normal.  ____________________________________________   LABS (all labs ordered are listed, but only abnormal results are displayed)  Labs Reviewed - No data to display ____________________________________________  EKG   ____________________________________________  RADIOLOGY  ED MD interpretation:    Official radiology report(s): No results  found.  ____________________________________________   PROCEDURES  Procedure(s) performed (including Critical Care):  Procedures   ____________________________________________   INITIAL IMPRESSION / ASSESSMENT AND PLAN / ED COURSE  As part of my medical decision making, I reviewed the following data within the electronic MEDICAL RECORD NUMBER         Patient presents for wound check secondary to incision and drainage of the left axillary area.  Remove drain.  Area was irrigated with clear return.  Patient area is re-bandaged and patient given discharge care instructions.  Vies return back if condition worsen.  Continue previous medications.      ____________________________________________   FINAL CLINICAL IMPRESSION(S) / ED DIAGNOSES  Final diagnoses:  Wound check, abscess     ED Discharge Orders    None       Note:  This document was prepared using Dragon voice recognition software and may include unintentional dictation errors.    Joni Reining, PA-C 07/28/18 1052    Sharman Cheek, MD 08/01/18 [phone]

## 2018-07-28 NOTE — ED Triage Notes (Signed)
Patient presents to the ED with a wound check to her right axilla.  Patient states on Saturday she had a wound check and the provider changed antibiotics for her and instructed her to return today.  Patient states she has been taking the new antibiotic as prescribed.  Patient states area is still "draining".

## 2018-07-28 NOTE — Discharge Instructions (Addendum)
Leave dressing in place for 24 hours.  Follow discharge care instructions.

## 2018-07-28 NOTE — ED Notes (Signed)
See triage note  Presents for wound check to axilla abscess   States area looks better but is draining

## 2019-01-18 ENCOUNTER — Emergency Department: Payer: Medicaid Other

## 2019-01-18 ENCOUNTER — Other Ambulatory Visit: Payer: Self-pay

## 2019-01-18 ENCOUNTER — Emergency Department
Admission: EM | Admit: 2019-01-18 | Discharge: 2019-01-18 | Disposition: A | Discharge status: Home / Self Care | Payer: Medicaid Other | Attending: Emergency Medicine | Admitting: Emergency Medicine

## 2019-01-18 DIAGNOSIS — M5432 Sciatica, left side: Secondary | ICD-10-CM

## 2019-01-18 DIAGNOSIS — I10 Essential (primary) hypertension: Secondary | ICD-10-CM | POA: Insufficient documentation

## 2019-01-18 DIAGNOSIS — M5442 Lumbago with sciatica, left side: Secondary | ICD-10-CM | POA: Insufficient documentation

## 2019-01-18 DIAGNOSIS — M25552 Pain in left hip: Secondary | ICD-10-CM | POA: Diagnosis present

## 2019-01-18 DIAGNOSIS — F17290 Nicotine dependence, other tobacco product, uncomplicated: Secondary | ICD-10-CM | POA: Diagnosis not present

## 2019-01-18 DIAGNOSIS — J45909 Unspecified asthma, uncomplicated: Secondary | ICD-10-CM | POA: Insufficient documentation

## 2019-01-18 MED ORDER — OXYCODONE-ACETAMINOPHEN 5-325 MG PO TABS: 1.0000 | ORAL_TABLET | Freq: Two times a day (BID) | ORAL | 0 refills | Status: DC | PRN

## 2019-01-18 MED ORDER — KETOROLAC TROMETHAMINE 60 MG/2ML IM SOLN
60.0000 mg | Freq: Once | INTRAMUSCULAR | Status: AC
  Administered 2019-01-18: 60 mg via INTRAMUSCULAR
  Filled 2019-01-18: qty 2

## 2019-01-18 MED ORDER — DEXAMETHASONE 1.5 MG (21) PO TBPK: 1.5000 mg | ORAL_TABLET | Freq: Once | ORAL | 0 refills | Status: AC

## 2019-01-18 MED ORDER — DEXAMETHASONE SODIUM PHOSPHATE 10 MG/ML IJ SOLN
10.0000 mg | Freq: Once | INTRAMUSCULAR | Status: AC
  Administered 2019-01-18: 10 mg via INTRAMUSCULAR
  Filled 2019-01-18: qty 1

## 2019-01-18 NOTE — ED Provider Notes (Signed)
[facility] Emergency Department Provider Note       Time seen: ----------------------------------------- 7:40 AM on 01/18/2019 -----------------------------------------   I have reviewed the triage vital signs and the nursing notes.  HISTORY   Chief Complaint Groin Pain    HPI Kristina Schultz is a 48 y.o. female with a history of asthma, bipolar disorder, hyperlipidemia, hypertension, seizures who presents to the ED for left hip pain.  Patient describes pain around her left hip that radiates into her low back.  She states she injured it several years ago when she was attempting to lift her father.  She states for the past week the pain is returned and this morning was much worse.  She denies any other complaints.  Past Medical History:  Diagnosis Date  . Asthma   . Bipolar 1 disorder ([place])   . Complication of anesthesia    PREFERS NOT TO HAVE GAS IF POSSIBLE/STATES SHE SMELLS AND TASTE IT FOR DAYS  . Fatty liver   . GERD (gastroesophageal reflux disease)   . Hyperlipemia   . Hypertension   . Seizures ([place]) 2017   LAST 2017    Patient Active Problem List   Diagnosis Date Noted  . Incarcerated ventral hernia 02/19/2016    Past Surgical History:  Procedure Laterality Date  . CYST REMOVAL TRUNK    . ESOPHAGOGASTRODUODENOSCOPY (EGD) WITH PROPOFOL N/A 10/17/2016   Procedure: ESOPHAGOGASTRODUODENOSCOPY (EGD) WITH PROPOFOL;  Surgeon: Christene Lye, MD;  Location: [facility] ENDOSCOPY;  Service: Endoscopy;  Laterality: N/A;  . VENTRAL HERNIA REPAIR  02/19/2016   Dr Jamal Collin  . VENTRAL HERNIA REPAIR N/A 02/19/2016   Procedure: incarcerated ventral hernia repair;  Surgeon: Christene Lye, MD;  Location: [facility] ORS;  Service: General;  Laterality: N/A;  . VENTRAL HERNIA REPAIR N/A 12/07/2016   Procedure: LAPAROSCOPIC VENTRAL HERNIA WITH MESH;  Surgeon: Christene Lye, MD;  Location: [facility] ORS;  Service: General;  Laterality: N/A;     Allergies Atenolol, Lamictal [lamotrigine], Lisinopril, Losartan, Celexa [citalopram hydrobromide], Penicillins, Prednisone, and Sulfa antibiotics  Social History Social History   Tobacco Use  . Smoking status: Current Every Day Smoker    Packs/day: 0.25    Types: Cigars  . Smokeless tobacco: Never Used  . Tobacco comment: 2 cigars per day  Substance Use Topics  . Alcohol use: No  . Drug use: No   Review of Systems Constitutional: Negative for fever. Cardiovascular: Negative for chest pain. Respiratory: Negative for shortness of breath. Gastrointestinal: Negative for abdominal pain, vomiting and diarrhea. Musculoskeletal: Positive for left hip and low back pain Skin: Negative for rash. Neurological: Negative for headaches, focal weakness or numbness.  All systems negative/normal/unremarkable except as stated in the HPI  ____________________________________________   PHYSICAL EXAM:  VITAL SIGNS: ED Triage Vitals  Enc Vitals Group     BP 01/18/19 0715 (!) 156/83     Pulse Rate 01/18/19 0715 96     Resp 01/18/19 0715 16     Temp 01/18/19 0715 98 F (36.7 C)     Temp Source 01/18/19 0715 Oral     SpO2 01/18/19 0715 93 %     Weight 01/18/19 0715 270 lb (122.5 kg)     Height 01/18/19 0715 5\' 11"  (1.803 m)     Head Circumference --      Peak Flow --      Pain Score 01/18/19 0720 10     Pain Loc --      Pain Edu? --  Excl. in GC? --     Constitutional: Alert and oriented. Well appearing and in no distress. Eyes: Conjunctivae are normal. Normal extraocular movements. Cardiovascular: Normal rate, regular rhythm. No murmurs, rubs, or gallops. Respiratory: Normal respiratory effort without tachypnea nor retractions. Breath sounds are clear and equal bilaterally. No wheezes/rales/rhonchi. Gastrointestinal: Soft and nontender. Normal bowel sounds Musculoskeletal: Mild pain with range of motion of the left hip, left lower extremity is neurovascular  intact Neurologic:  Normal speech and language. No gross focal neurologic deficits are appreciated.  Skin:  Skin is warm, dry and intact. No rash noted. Psychiatric: Mood and affect are normal. Speech and behavior are normal.  ____________________________________________  ED COURSE:  As part of my medical decision making, I reviewed the following data within the electronic MEDICAL RECORD NUMBER History obtained from family if available, nursing notes, old chart and ekg, as well as notes from prior ED visits. Patient presented for left hip pain, we will assess with labs and imaging as indicated at this time.   Procedures  Kristina Schultz was evaluated in Emergency Department on 01/18/2019 for the symptoms described in the history of present illness. She was evaluated in the context of the global COVID-19 pandemic, which necessitated consideration that the patient might be at risk for infection with the SARS-CoV-2 virus that causes COVID-19. Institutional protocols and algorithms that pertain to the evaluation of patients at risk for COVID-19 are in a state of rapid change based on information released by regulatory bodies including the CDC and federal and state organizations. These policies and algorithms were followed during the patient's care in the ED.  ____________________________________________   RADIOLOGY Images were viewed by me  Lumbar spine, left hip x-ray IMPRESSION:  Mild degenerative changes. No evidence for acute abnormality.  ____________________________________________   DIFFERENTIAL DIAGNOSIS   Sciatica, strain, spasm  FINAL ASSESSMENT AND PLAN  Sciatica   Plan: The patient had presented for left hip pain.  Patient's imaging did not reveal any acute process.  She be discharged with steroids and pain medicine for what is presumed to be sciatica.  She is cleared for outpatient follow-up.   Ulice DashJohnathan E Williams, MD    Note: This note was generated in part or whole with  voice recognition software. Voice recognition is usually quite accurate but there are transcription errors that can and very often do occur. I apologize for any typographical errors that were not detected and corrected.     Emily FilbertWilliams, Jonathan E, MD 01/18/19 [phone]

## 2019-01-18 NOTE — ED Notes (Signed)
Pt refused pregnancy test.

## 2019-01-18 NOTE — ED Triage Notes (Signed)
Pt states a few years back injured the left hip attempting to lift her dad. Pt states that for the past week the pain has returned and this am is much worse. Pt is pointing to her left groin and states that the pain radiates into her left hip area and into her left flank area as well as the left thigh, denies pain with urination

## 2019-01-19 NOTE — ED Notes (Signed)
[facility] called this am as they do not have dexamethasone packs.  Per dr Charna Archer can change to medrol dose pack.  We do have prednisone as an allergy--GI upset--[facility] did not have the allergy listed.  They will check with patient and inform that medrol is related drug.

## 2019-08-28 ENCOUNTER — Ambulatory Visit: Payer: Medicaid Other | Attending: Internal Medicine

## 2019-08-28 DIAGNOSIS — Z23 Encounter for immunization: Secondary | ICD-10-CM

## 2019-08-28 NOTE — Progress Notes (Signed)
   Covid-19 Vaccination Clinic  Name:  Kristina Schultz    MRN: [id number] DOB: [birth year]  08/28/2019  [name] was observed post Covid-19 immunization for 15 minutes without incident. She was provided with Vaccine Information Sheet and instruction to access the V-Safe system.   [name] was instructed to call 911 with any severe reactions post vaccine: Marland Kitchen Difficulty breathing  . Swelling of face and throat  . A fast heartbeat  . A bad rash all over body  . Dizziness and weakness   Immunizations Administered    Name Date Dose VIS Date Route   Pfizer COVID-19 Vaccine 08/28/2019  6:36 PM 0.3 mL 05/08/2019 Intramuscular   Manufacturer: [employer], [employer]   Lot: [id number]   NDC: [address]-1

## 2019-09-18 ENCOUNTER — Ambulatory Visit: Payer: Medicaid Other | Attending: Internal Medicine

## 2019-09-18 DIAGNOSIS — Z23 Encounter for immunization: Secondary | ICD-10-CM

## 2019-09-18 NOTE — Progress Notes (Signed)
   Covid-19 Vaccination Clinic  Name:  LIDA BERKERY    MRN: [id number] DOB: 03-28-1971  09/18/2019  Ms. Joerger was observed post Covid-19 immunization for 15 minutes without incident. She was provided with Vaccine Information Sheet and instruction to access the V-Safe system.   Ms. Ozawa was instructed to call 911 with any severe reactions post vaccine: Marland Kitchen Difficulty breathing  . Swelling of face and throat  . A fast heartbeat  . A bad rash all over body  . Dizziness and weakness   Immunizations Administered    Name Date Dose VIS Date Route   Pfizer COVID-19 Vaccine 09/18/2019  5:20 PM 0.3 mL 07/22/2018 Intramuscular   Manufacturer: [employer], [employer]   Lot: [id number]   NDC: [address]-1

## 2019-10-16 ENCOUNTER — Emergency Department: Payer: Medicare Other

## 2019-10-16 ENCOUNTER — Emergency Department
Admission: EM | Admit: 2019-10-16 | Discharge: 2019-10-16 | Disposition: A | Discharge status: Home / Self Care | Payer: Medicare Other | Attending: Emergency Medicine | Admitting: Emergency Medicine

## 2019-10-16 ENCOUNTER — Other Ambulatory Visit: Payer: Self-pay

## 2019-10-16 DIAGNOSIS — Z79899 Other long term (current) drug therapy: Secondary | ICD-10-CM | POA: Insufficient documentation

## 2019-10-16 DIAGNOSIS — K859 Acute pancreatitis without necrosis or infection, unspecified: Secondary | ICD-10-CM

## 2019-10-16 DIAGNOSIS — F17210 Nicotine dependence, cigarettes, uncomplicated: Secondary | ICD-10-CM | POA: Insufficient documentation

## 2019-10-16 DIAGNOSIS — I10 Essential (primary) hypertension: Secondary | ICD-10-CM | POA: Diagnosis not present

## 2019-10-16 DIAGNOSIS — R1013 Epigastric pain: Secondary | ICD-10-CM | POA: Diagnosis present

## 2019-10-16 LAB — COMPREHENSIVE METABOLIC PANEL
ALT: 14 U/L (ref 0–44)
AST: 19 U/L (ref 15–41)
Albumin: 4.2 g/dL (ref 3.5–5.0)
Alkaline Phosphatase: 82 U/L (ref 38–126)
Anion gap: 10 (ref 5–15)
BUN: 11 mg/dL (ref 6–20)
CO2: 23 mmol/L (ref 22–32)
Calcium: 9.4 mg/dL (ref 8.9–10.3)
Chloride: 100 mmol/L (ref 98–111)
Creatinine, Ser: 0.94 mg/dL (ref 0.44–1.00)
GFR calc Af Amer: >60 mL/min (ref >60)
GFR calc non Af Amer: >60 mL/min (ref >60)
Glucose, Bld: 93 mg/dL (ref 70–99)
Potassium: 4.3 mmol/L (ref 3.5–5.1)
Sodium: 133 mmol/L — ABNORMAL LOW (ref 135–145)
Total Bilirubin: 0.8 mg/dL (ref 0.3–1.2)
Total Protein: 8.2 g/dL — ABNORMAL HIGH (ref 6.5–8.1)

## 2019-10-16 LAB — LIPASE, BLOOD: Lipase: 69 U/L — ABNORMAL HIGH (ref 11–51)

## 2019-10-16 LAB — CBC
HCT: 37.8 % (ref 36.0–46.0)
Hemoglobin: 12.9 g/dL (ref 12.0–15.0)
MCH: 29.7 pg (ref 26.0–34.0)
MCHC: 34.1 g/dL (ref 30.0–36.0)
MCV: 86.9 fL (ref 80.0–100.0)
Platelets: 267 10*3/uL (ref 150–400)
RBC: 4.35 MIL/uL (ref 3.87–5.11)
RDW: 12.8 % (ref 11.5–15.5)
WBC: 8.6 10*3/uL (ref 4.0–10.5)
nRBC: 0 % (ref 0.0–0.2)

## 2019-10-16 MED ORDER — IOHEXOL 300 MG/ML  SOLN
125.0000 mL | Freq: Once | INTRAMUSCULAR | Status: AC | PRN
  Administered 2019-10-16: 125 mL via INTRAVENOUS

## 2019-10-16 MED ORDER — LIDOCAINE VISCOUS HCL 2 % MT SOLN
15.0000 mL | Freq: Once | OROMUCOSAL | Status: AC
  Administered 2019-10-16: 15 mL via OROMUCOSAL
  Filled 2019-10-16: qty 15

## 2019-10-16 MED ORDER — MORPHINE SULFATE (PF) 4 MG/ML IV SOLN
4.0000 mg | Freq: Once | INTRAVENOUS | Status: DC
  Filled 2019-10-16: qty 1

## 2019-10-16 MED ORDER — SODIUM CHLORIDE 0.9 % IV BOLUS: 1000.0000 mL | Freq: Once | INTRAVENOUS | Status: DC

## 2019-10-16 MED ORDER — ONDANSETRON HCL 4 MG PO TABS: 4.0000 mg | ORAL_TABLET | Freq: Three times a day (TID) | ORAL | 0 refills | Status: DC | PRN

## 2019-10-16 MED ORDER — ONDANSETRON HCL 4 MG/2ML IJ SOLN
4.0000 mg | Freq: Once | INTRAMUSCULAR | Status: DC
  Filled 2019-10-16: qty 2

## 2019-10-16 MED ORDER — OXYCODONE HCL 5 MG PO TABS: 5.0000 mg | ORAL_TABLET | Freq: Three times a day (TID) | ORAL | 0 refills | Status: DC | PRN

## 2019-10-16 NOTE — ED Notes (Addendum)
See triage note, pt reports mid epigastric pain and left flank pain that started on Tuesday as well as SHOB. Denies CP. Reports Nausea, denies vomiting. Denies fevers.  Hx gastritis, pancreatitis.  Denies burning with urination, reports some difficulty urinating.  Pt in NAD at this time  RR even and unlabored

## 2019-10-16 NOTE — ED Notes (Signed)
Pt refused all medications, states ready for d/c papers and to go home. Dr Derrill Kay made aware

## 2019-10-16 NOTE — ED Provider Notes (Signed)
[facility] Emergency Department Provider Note   ____________________________________________   I have reviewed the triage vital signs and the nursing notes.   HISTORY  Chief Complaint Abdominal Pain and Shortness of Breath   History limited by: Not Limited   HPI Kristina Schultz is a 49 y.o. female who presents to the emergency department today because of concerns for abdominal pain.  The patient states that the pain started 3 days ago.  It gradually got worse.  Is located in the epigastric area and radiates to her left upper abdomen.  Has been associated with some nausea although she has not had any vomiting.  She did notice a slight decrease in her stooling recently so tried taking a laxative with very minimal output.  Patient denies any fevers.  States she has a history of pancreatitis.     Records reviewed. Per medical record review patient has a history of asthma, GERD, HLD, HTN.   Past Medical History:  Diagnosis Date  . Asthma   . Bipolar 1 disorder ([place])   . Complication of anesthesia    PREFERS NOT TO HAVE GAS IF POSSIBLE/STATES SHE SMELLS AND TASTE IT FOR DAYS  . Fatty liver   . GERD (gastroesophageal reflux disease)   . Hyperlipemia   . Hypertension   . Seizures ([place]) 2017   LAST 2017    Patient Active Problem List   Diagnosis Date Noted  . Incarcerated ventral hernia 02/19/2016    Past Surgical History:  Procedure Laterality Date  . CYST REMOVAL TRUNK    . ESOPHAGOGASTRODUODENOSCOPY (EGD) WITH PROPOFOL N/A 10/17/2016   Procedure: ESOPHAGOGASTRODUODENOSCOPY (EGD) WITH PROPOFOL;  Surgeon: Christene Lye, MD;  Location: [facility] ENDOSCOPY;  Service: Endoscopy;  Laterality: N/A;  . VENTRAL HERNIA REPAIR  02/19/2016   Dr Jamal Collin  . VENTRAL HERNIA REPAIR N/A 02/19/2016   Procedure: incarcerated ventral hernia repair;  Surgeon: Christene Lye, MD;  Location: [facility] ORS;  Service: General;  Laterality: N/A;  . VENTRAL HERNIA REPAIR N/A  12/07/2016   Procedure: LAPAROSCOPIC VENTRAL HERNIA WITH MESH;  Surgeon: Christene Lye, MD;  Location: [facility] ORS;  Service: General;  Laterality: N/A;    Prior to Admission medications   Medication Sig Start Date End Date Taking? Authorizing Provider  acetaminophen (TYLENOL) 500 MG tablet Take 500 mg by mouth every 6 (six) hours as needed for pain.    [provider]  albuterol (PROVENTIL HFA;VENTOLIN HFA) 108 (90 BASE) MCG/ACT inhaler Inhale 2 puffs into the lungs every 6 (six) hours as needed for wheezing or shortness of breath. 03/06/15   Lavonia Drafts, MD  famotidine (PEPCID) 20 MG tablet Take 1 tablet (20 mg total) by mouth 2 (two) times daily for 5 days. 06/15/17 06/20/17  Lannie Fields, PA-C  levETIRAcetam (KEPPRA) 750 MG tablet Take 1 tablet (750 mg total) by mouth 2 (two) times daily. Patient taking differently: Take 375 mg by mouth at bedtime.  04/26/16 05/15/17  Merlyn Lot, MD  oxyCODONE-acetaminophen (PERCOCET) 5-325 MG tablet Take 1 tablet by mouth 2 (two) times daily as needed. 01/18/19   Earleen Newport, MD  polyethylene glycol [facility] / Floria Raveling) packet Take 17 g by mouth daily. 05/15/17   Earleen Newport, MD  senna-docusate (SENOKOT-S) 8.6-50 MG tablet Take 1 tablet by mouth daily as needed for up to 15 doses for mild constipation or moderate constipation. 08/27/17   Eula Listen, MD  sertraline (ZOLOFT) 50 MG tablet Take 50 mg by mouth at bedtime.  [provider]  triamcinolone ointment (KENALOG) 0.5 % Apply 1 application topically 2 (two) times daily. 01/23/15   Triplett, Rulon Eisenmenger B, FNP  vitamin B-12 (CYANOCOBALAMIN) 1000 MCG tablet Take 1,000 mcg by mouth daily. 12/10/18 12/10/19  [provider]    Allergies Atenolol, Lamictal [lamotrigine], Lisinopril, Losartan, Celexa [citalopram hydrobromide], Penicillins, Prednisone, and Sulfa antibiotics  Family History  Problem Relation Age of Onset  . Transient ischemic attack  Father   . Breast cancer Sister     Social History Social History   Tobacco Use  . Smoking status: Current Every Day Smoker    Packs/day: 0.25    Types: Cigars  . Smokeless tobacco: Never Used  . Tobacco comment: 2 cigars per day  Substance Use Topics  . Alcohol use: No  . Drug use: No    Review of Systems Constitutional: No fever/chills Eyes: No visual changes. ENT: No sore throat. Cardiovascular: Denies chest pain. Respiratory: Denies shortness of breath. Gastrointestinal: Positive for abdominal pain.  Genitourinary: Negative for dysuria. Musculoskeletal: Negative for back pain. Skin: Negative for rash. Neurological: Negative for headaches, focal weakness or numbness.  ____________________________________________   PHYSICAL EXAM:  VITAL SIGNS: ED Triage Vitals  Enc Vitals Group     BP 10/16/19 1627 121/81     Pulse Rate 10/16/19 1627 85     Resp 10/16/19 1627 16     Temp 10/16/19 1627 98.8 F (37.1 C)     Temp Source 10/16/19 1627 Oral     SpO2 10/16/19 1627 99 %     Weight 10/16/19 1623 276 lb (125.2 kg)     Height 10/16/19 1623 5\' 11"  (1.803 m)     Head Circumference --      Peak Flow --      Pain Score 10/16/19 1623 6   Constitutional: Alert and oriented.  Eyes: Conjunctivae are normal.  ENT      Head: Normocephalic and atraumatic.      Nose: No congestion/rhinnorhea.      Mouth/Throat: Mucous membranes are moist.      Neck: No stridor. Hematological/Lymphatic/Immunilogical: No cervical lymphadenopathy. Cardiovascular: Normal rate, regular rhythm.  No murmurs, rubs, or gallops.  Respiratory: Normal respiratory effort without tachypnea nor retractions. Breath sounds are clear and equal bilaterally. No wheezes/rales/rhonchi. Gastrointestinal: Soft and tender to palpation in the epigastric region. Genitourinary: Deferred Musculoskeletal: Normal range of motion in all extremities. No lower extremity edema. Neurologic:  Normal speech and language. No  gross focal neurologic deficits are appreciated.  Skin:  Skin is warm, dry and intact. No rash noted. Psychiatric: Mood and affect are normal. Speech and behavior are normal. Patient exhibits appropriate insight and judgment.  ____________________________________________    LABS (pertinent positives/negatives)  Lipase 69 CMP na 133, t pro 8.2, otherwise wnl CBC wbc 8.6, hgb 12.9, plt 267  ____________________________________________   EKG  I, 10/18/19, attending physician, personally viewed and interpreted this EKG  EKG Time: 1622 Rate: 86 Rhythm: normal sinus rhythm Axis: normal Intervals: qtc 435 QRS: narrow ST changes: no st elevation Impression: normal ekg   ____________________________________________    RADIOLOGY  CT abd.pel Consistent with acute pancreatitis  ____________________________________________   PROCEDURES  Procedures  ____________________________________________   INITIAL IMPRESSION / ASSESSMENT AND PLAN / ED COURSE  Pertinent labs & imaging results that were available during my care of the patient were reviewed by me and considered in my medical decision making (see chart for details).   Patient presented to the emergency department today because  of concern for epigastric pain. Initial blood work showed mildly elevated lipase. Given very minimal elevation of lipase unclear initially if patient was suffering from pancreatitis. Did try viscous lidocaine without any significant relief. Did obtain CT scan which was consistent with pancreatitis. Discussed this finding with the patient. Did write for IV fluids/pain meds and antiemetics however patient declined stating she wanted to be discharged. Will discharge with medication to help her symptoms. .   _______________________________________   FINAL CLINICAL IMPRESSION(S) / ED DIAGNOSES  Final diagnoses:  Acute pancreatitis, unspecified complication status, unspecified pancreatitis type      Note: This dictation was prepared with Dragon dictation. Any transcriptional errors that result from this process are unintentional     Phineas Semen, MD 10/17/19 1513

## 2019-10-16 NOTE — ED Notes (Signed)
Multiple RN attempt IV for CT contrast with no success. Danelle Earthly RN at bedside to attempt [place] IV

## 2019-10-16 NOTE — ED Triage Notes (Signed)
Epigastric pain radiating around left side and into mid back, sob X 3-4 days. Nausea. Small BM last night, feels bloated.

## 2019-10-16 NOTE — ED Notes (Signed)
Pt in CT.

## 2019-10-16 NOTE — Discharge Instructions (Addendum)
Please seek medical attention for any high fevers, chest pain, shortness of breath, change in behavior, persistent vomiting, bloody stool or any other new or concerning symptoms.  

## 2019-11-06 ENCOUNTER — Other Ambulatory Visit: Payer: Self-pay | Admitting: Family Medicine

## 2019-11-06 DIAGNOSIS — Z1231 Encounter for screening mammogram for malignant neoplasm of breast: Secondary | ICD-10-CM

## 2019-11-11 ENCOUNTER — Ambulatory Visit
Admission: RE | Admit: 2019-11-11 | Discharge: 2019-11-11 | Disposition: A | Discharge status: Home / Self Care | Payer: Medicare Other | Source: Ambulatory Visit | Attending: Family Medicine | Admitting: Family Medicine

## 2019-11-11 ENCOUNTER — Other Ambulatory Visit: Payer: Self-pay

## 2019-11-11 DIAGNOSIS — Z1231 Encounter for screening mammogram for malignant neoplasm of breast: Secondary | ICD-10-CM

## 2019-11-26 ENCOUNTER — Ambulatory Visit (INDEPENDENT_AMBULATORY_CARE_PROVIDER_SITE_OTHER): Payer: Medicare Other | Admitting: Gastroenterology

## 2019-11-26 ENCOUNTER — Encounter: Payer: Self-pay | Admitting: Gastroenterology

## 2019-11-26 ENCOUNTER — Other Ambulatory Visit: Payer: Self-pay

## 2019-11-26 VITALS — BP 113/76 | HR 86 | Temp 97.6°F | Ht 71.5 in | Wt 263.6 lb

## 2019-11-26 DIAGNOSIS — Z8719 Personal history of other diseases of the digestive system: Secondary | ICD-10-CM | POA: Diagnosis not present

## 2019-11-26 DIAGNOSIS — K862 Cyst of pancreas: Secondary | ICD-10-CM

## 2019-11-26 DIAGNOSIS — K859 Acute pancreatitis without necrosis or infection, unspecified: Secondary | ICD-10-CM | POA: Diagnosis not present

## 2019-11-26 NOTE — Progress Notes (Signed)
Gastroenterology Consultation  Referring Provider:     Rayetta Humphrey, MD Primary Care Physician:  Rayetta Humphrey, MD Primary Gastroenterologist:  Dr. Servando Snare     Reason for Consultation:     Recurrent pancreatitis        HPI:   TANIJA GERMANI is a 49 y.o. y/o female referred for consultation & management of recurrent pancreatitis by Dr. Greggory Stallion, Marylin Crosby, MD.  This patient comes in today after being seen over at the [facility] clinic for the same issues back in 2017.  The patient most recently went to the emergency department on May 21 and was reporting 3 days of abdominal pain with some nausea without any vomiting.  The patient had been evaluated back in 2017 by GI at [facility] and at that time had reported previous hospitalizations for pancreatitis and greater than 10 attacks in her past.  Originally the patient was on lisinopril and has previously treated her attacks with a low-fat diet and Jell-O.  The patient was admitted recently and her lipase was checked as well as a CT scan done.  The trending of her lipase over the years has shown:  Component     Latest Ref Rng & Units 02/01/2015 09/03/2015 10/30/2015 11/09/2015  Lipase     11 - 51 U/L 83 (H) 107 (H) 100 (H) 39   Component     Latest Ref Rng & Units 02/18/2016 09/20/2016 05/15/2017 08/27/2017  Lipase     11 - 51 U/L 20 20 20  72 (H)   Component     Latest Ref Rng & Units 09/26/2017 10/16/2019  Lipase     11 - 51 U/L 57 (H) 69 (H)   The patient CT scan of the abdomen in the ER this May showed:  IMPRESSION: 1. Mild haziness about the pancreatic head is suggestive of uncomplicated pancreatitis. Correlation lipase is recommended. 2. Rectosigmoid diverticulosis without acute inflammation.  Aortic Atherosclerosis (ICD10-I70.0).  The patient has had multiple CT scans of the abdomen dating back to 2013 with all of the ones done during abdominal pain episodes showing uncomplicated pancreatitis.  The patient denies any family history of  pancreatitis.  The patient also denies ever having an MRI to evaluate her for her recurrent pancreatitis.  Past Medical History:  Diagnosis Date  . Asthma   . Bipolar 1 disorder (HCC)   . Complication of anesthesia    PREFERS NOT TO HAVE GAS IF POSSIBLE/STATES SHE SMELLS AND TASTE IT FOR DAYS  . Fatty liver   . GERD (gastroesophageal reflux disease)   . Hyperlipemia   . Hypertension   . Seizures (HCC) 2017   LAST 2017    Past Surgical History:  Procedure Laterality Date  . CYST REMOVAL TRUNK    . ESOPHAGOGASTRODUODENOSCOPY (EGD) WITH PROPOFOL N/A 10/17/2016   Procedure: ESOPHAGOGASTRODUODENOSCOPY (EGD) WITH PROPOFOL;  Surgeon: 10/19/2016, MD;  Location: [facility] ENDOSCOPY;  Service: Endoscopy;  Laterality: N/A;  . VENTRAL HERNIA REPAIR  02/19/2016   Dr 02/21/2016  . VENTRAL HERNIA REPAIR N/A 02/19/2016   Procedure: incarcerated ventral hernia repair;  Surgeon: 02/21/2016, MD;  Location: [facility] ORS;  Service: General;  Laterality: N/A;  . VENTRAL HERNIA REPAIR N/A 12/07/2016   Procedure: LAPAROSCOPIC VENTRAL HERNIA WITH MESH;  Surgeon: 12/09/2016, MD;  Location: [facility] ORS;  Service: General;  Laterality: N/A;    Prior to Admission medications   Medication Sig Start Date End Date Taking? Authorizing Provider  acetaminophen (TYLENOL) 500  MG tablet Take 500 mg by mouth every 6 (six) hours as needed for pain.    [provider]  albuterol (PROVENTIL HFA;VENTOLIN HFA) 108 (90 BASE) MCG/ACT inhaler Inhale 2 puffs into the lungs every 6 (six) hours as needed for wheezing or shortness of breath. 03/06/15   Jene Every, MD  Alum & Mag Hydroxide-Simeth (MYLANTA MAXIMUM STRENGTH PO)  10/28/19   [provider]  ergocalciferol (VITAMIN D2) 1.25 MG (50000 UT) capsule Take by mouth. 10/24/19 01/22/20  [provider]  esomeprazole (NEXIUM) 40 MG capsule Take 40 mg by mouth every morning. 10/19/19   [provider]  famotidine (PEPCID) 20 MG  tablet Take 1 tablet (20 mg total) by mouth 2 (two) times daily for 5 days. 06/15/17 06/20/17  Orvil Feil, PA-C  levETIRAcetam (KEPPRA) 750 MG tablet Take 1 tablet (750 mg total) by mouth 2 (two) times daily. Patient taking differently: Take 375 mg by mouth at bedtime.  04/26/16 05/15/17  Willy Eddy, MD  meloxicam (MOBIC) 7.5 MG tablet Take 7.5 mg by mouth 2 (two) times daily. 11/17/19   [provider]  ondansetron (ZOFRAN) 4 MG tablet Take 1 tablet (4 mg total) by mouth every 8 (eight) hours as needed. 10/16/19   Phineas Semen, MD  oxyCODONE (ROXICODONE) 5 MG immediate release tablet Take 1 tablet (5 mg total) by mouth every 8 (eight) hours as needed. 10/16/19 10/15/20  Phineas Semen, MD  oxyCODONE-acetaminophen (PERCOCET) 5-325 MG tablet Take 1 tablet by mouth 2 (two) times daily as needed. 01/18/19   Emily Filbert, MD  polyethylene glycol [facility] / Ethelene Hal) packet Take 17 g by mouth daily. 05/15/17   Emily Filbert, MD  senna-docusate (SENOKOT-S) 8.6-50 MG tablet Take 1 tablet by mouth daily as needed for up to 15 doses for mild constipation or moderate constipation. 08/27/17   Rockne Menghini, MD  sertraline (ZOLOFT) 50 MG tablet Take 50 mg by mouth at bedtime.    [provider]  triamcinolone ointment (KENALOG) 0.5 % Apply 1 application topically 2 (two) times daily. 01/23/15   Triplett, Rulon Eisenmenger B, FNP  vitamin B-12 (CYANOCOBALAMIN) 1000 MCG tablet Take 1,000 mcg by mouth daily. 12/10/18 12/10/19  [provider]    Family History  Problem Relation Age of Onset  . Transient ischemic attack Father   . Breast cancer Sister 71     Social History   Tobacco Use  . Smoking status: Current Every Day Smoker    Packs/day: 0.25    Types: Cigars  . Smokeless tobacco: Never Used  . Tobacco comment: 2 cigars per day  Vaping Use  . Vaping Use: Former  Substance Use Topics  . Alcohol use: No  . Drug use: No    Allergies as of 11/26/2019 -  Review Complete 10/16/2019  Allergen Reaction Noted  . Atenolol Anaphylaxis 09/03/2015  . Lamictal [lamotrigine] Rash 04/26/2016  . Lisinopril Other (See Comments) 02/18/2016  . Losartan  06/15/2017  . Celexa [citalopram hydrobromide] Rash 05/17/2015  . Penicillins Rash and Other (See Comments) 01/23/2015  . Prednisone Other (See Comments) 09/04/2015  . Sulfa antibiotics Rash 01/23/2015    Review of Systems:    All systems reviewed and negative except where noted in HPI.   Physical Exam:  LMP 11/01/2019  Patient's last menstrual period was 11/01/2019. General:   Alert,  Well-developed, well-nourished, pleasant and cooperative in NAD Head:  Normocephalic and atraumatic. Eyes:  Sclera clear, no icterus.   Conjunctiva pink. Ears:  Normal  auditory acuity. Neck:  Supple; no masses or thyromegaly. Lungs:  Respirations even and unlabored.  Clear throughout to auscultation.   No wheezes, crackles, or rhonchi. No acute distress. Heart:  Regular rate and rhythm; no murmurs, clicks, rubs, or gallops. Abdomen:  Normal bowel sounds.  No bruits.  Soft, non-tender and non-distended without masses, hepatosplenomegaly or hernias noted.  No guarding or rebound tenderness.  Negative Carnett sign.   Rectal:  Deferred.  Pulses:  Normal pulses noted. Extremities:  No clubbing or edema.  No cyanosis. Neurologic:  Alert and oriented x3;  grossly normal neurologically. Skin:  Intact without significant lesions or rashes.  No jaundice. Lymph Nodes:  No significant cervical adenopathy. Psych:  Alert and cooperative. Normal mood and affect.  Imaging Studies: MM 3D SCREEN BREAST BILATERAL  Result Date: 11/12/2019 CLINICAL DATA:  Screening. EXAM: DIGITAL SCREENING BILATERAL MAMMOGRAM WITH TOMO AND CAD COMPARISON:  None. ACR Breast Density Category b: There are scattered areas of fibroglandular density. FINDINGS: There are no findings suspicious for malignancy. Images were processed with CAD. IMPRESSION: No  mammographic evidence of malignancy. A result letter of this screening mammogram will be mailed directly to the patient. RECOMMENDATION: Screening mammogram in one year. (Code:[id number]-B-01Y) BI-RADS CATEGORY  1: Negative. Electronically Signed   By: Frederico Hamman M.D.   On: 11/12/2019 12:13    Assessment and Plan:   RAIGAN BARIA is a 49 y.o. y/o female who comes in with a history of recurrent pancreatitis.  The patient will have her blood sent off for ANA and IgG4.  It is unlikely that the patient has autoimmune pancreatitis but believe it should be ruled out anyway.  The patient will also have an MRI with MRCP to rule out pancreatic divisum.  The patient has also been explained that microlithiasis can also be a cause of recurrent pancreatitis and if pancreatic divisum and autoimmune pancreatitis are not the issue then the patient may consider having her gallbladder removed.  The patient and the patient's mother have been explained the plan and agree with it.    Midge Minium, MD. Clementeen Graham    Note: This dictation was prepared with Dragon dictation along with smaller phrase technology. Any transcriptional errors that result from this process are unintentional.

## 2019-11-27 LAB — ANA: Anti Nuclear Antibody (ANA): NEGATIVE

## 2019-11-27 LAB — IGG 4: IgG, Subclass 4: 18 mg/dL (ref 2–96)

## 2019-12-01 ENCOUNTER — Telehealth: Payer: Self-pay

## 2019-12-01 NOTE — Telephone Encounter (Signed)
Pt notified of lab results via mychart. 

## 2019-12-01 NOTE — Telephone Encounter (Signed)
-----   Message from Midge Minium, MD sent at 11/28/2019  9:09 AM EDT ----- Let the patient know that the blood test for a autoimmune cause of her pancreatitis were negative showing that this is unlikely an autoimmune disease.

## 2019-12-08 ENCOUNTER — Other Ambulatory Visit: Payer: Self-pay

## 2019-12-08 ENCOUNTER — Encounter: Payer: Self-pay | Admitting: Emergency Medicine

## 2019-12-08 ENCOUNTER — Emergency Department
Admission: EM | Admit: 2019-12-08 | Discharge: 2019-12-08 | Disposition: A | Discharge status: Home / Self Care | Payer: Medicare Other | Attending: Student in an Organized Health Care Education/Training Program | Admitting: Student in an Organized Health Care Education/Training Program

## 2019-12-08 DIAGNOSIS — F17290 Nicotine dependence, other tobacco product, uncomplicated: Secondary | ICD-10-CM | POA: Diagnosis not present

## 2019-12-08 DIAGNOSIS — L02412 Cutaneous abscess of left axilla: Secondary | ICD-10-CM | POA: Diagnosis not present

## 2019-12-08 DIAGNOSIS — Z7951 Long term (current) use of inhaled steroids: Secondary | ICD-10-CM | POA: Insufficient documentation

## 2019-12-08 DIAGNOSIS — I10 Essential (primary) hypertension: Secondary | ICD-10-CM | POA: Insufficient documentation

## 2019-12-08 DIAGNOSIS — J45909 Unspecified asthma, uncomplicated: Secondary | ICD-10-CM | POA: Diagnosis not present

## 2019-12-08 DIAGNOSIS — Z79899 Other long term (current) drug therapy: Secondary | ICD-10-CM | POA: Diagnosis not present

## 2019-12-08 MED ORDER — LIDOCAINE HCL (PF) 1 % IJ SOLN
5.0000 mL | Freq: Once | INTRAMUSCULAR | Status: AC
  Administered 2019-12-08: 5 mL via INTRADERMAL
  Filled 2019-12-08: qty 5

## 2019-12-08 MED ORDER — CLINDAMYCIN HCL 300 MG PO CAPS: 300.0000 mg | ORAL_CAPSULE | Freq: Three times a day (TID) | ORAL | 0 refills | Status: AC

## 2019-12-08 MED ORDER — HYDROCODONE-ACETAMINOPHEN 5-325 MG PO TABS: 1.0000 | ORAL_TABLET | Freq: Four times a day (QID) | ORAL | 0 refills | Status: AC | PRN

## 2019-12-08 NOTE — ED Triage Notes (Signed)
Pt here for abscess to left axilla. Hx of same on right that needed I&D and abx.  No fever. NAD, VSS

## 2019-12-08 NOTE — Discharge Instructions (Signed)
Please pull the packing out in 2 to 3 days. Take the antibiotics until finished. See primary care, urgent care, or return to the emergency department for concerns.

## 2019-12-08 NOTE — ED Notes (Signed)
See triage note  Presents with possible abscess area under left arm  States she noticed the area about 4 days ago

## 2019-12-09 ENCOUNTER — Telehealth: Payer: Self-pay | Admitting: Family Medicine

## 2019-12-09 MED ORDER — DOXYCYCLINE HYCLATE 100 MG PO CAPS: 100.0000 mg | ORAL_CAPSULE | Freq: Two times a day (BID) | ORAL | 0 refills | Status: DC

## 2019-12-09 NOTE — Telephone Encounter (Cosign Needed)
Patient called with concern for allergic reaction to Clindamycin. Perceived reaction is fatigue and "feeling crazy." No shortness of breath, rash, or other anaphylaxis symptoms.  Will send doxycycline to her pharmacy.   Patient advised to come to the ER for symptoms of concern.

## 2019-12-12 ENCOUNTER — Other Ambulatory Visit: Payer: Self-pay

## 2019-12-12 ENCOUNTER — Other Ambulatory Visit: Payer: Self-pay | Admitting: Gastroenterology

## 2019-12-12 ENCOUNTER — Ambulatory Visit
Admission: RE | Admit: 2019-12-12 | Discharge: 2019-12-12 | Disposition: A | Discharge status: Home / Self Care | Payer: Medicare Other | Source: Ambulatory Visit | Attending: Gastroenterology | Admitting: Gastroenterology

## 2019-12-12 DIAGNOSIS — K862 Cyst of pancreas: Secondary | ICD-10-CM

## 2019-12-12 MED ORDER — GADOBUTROL 1 MMOL/ML IV SOLN
10.0000 mL | Freq: Once | INTRAVENOUS | Status: AC | PRN
  Administered 2019-12-12: 10 mL via INTRAVENOUS

## 2019-12-15 ENCOUNTER — Telehealth: Payer: Self-pay

## 2019-12-15 NOTE — Telephone Encounter (Signed)
Pt notified of MRI results via mychart.  

## 2019-12-15 NOTE — Telephone Encounter (Signed)
-----   Message from Midge Minium, MD sent at 12/14/2019 10:06 AM EDT ----- Let the patient know that the MRI did not show anything wrong with her pancreas.  There was no pancreatic masses or cysts.  There is also no abnormal anatomy of the pancreas.  The MRI did not show any signs of pancreatic inflammation noticed pancreatitis.  The liver did show signs of fatty liver which can be caused by being alcohol, being overweight, diabetes, high cholesterol, and triglycerides and is best treated by controlling the causes.

## 2019-12-15 NOTE — ED Provider Notes (Signed)
[facility] Emergency Department Provider Note  ____________________________________________  Time seen: Approximately 12:31 AM  I have reviewed the triage vital signs and the nursing notes.   HISTORY  Chief Complaint Abscess   HPI Kristina Schultz is a 49 y.o. female presents to the emergency department for treatment and evaluation of left axilla abscess.  She has had abscess in the past.  She denies fever.  No alleviating measures attempted prior to arrival.   Past Medical History:  Diagnosis Date  . Asthma   . Bipolar 1 disorder (HCC)   . Complication of anesthesia    PREFERS NOT TO HAVE GAS IF POSSIBLE/STATES SHE SMELLS AND TASTE IT FOR DAYS  . Fatty liver   . GERD (gastroesophageal reflux disease)   . Hyperlipemia   . Hypertension   . Seizures (HCC) 2017   LAST 2017    Patient Active Problem List   Diagnosis Date Noted  . Aortic atherosclerosis (HCC) 04/02/2018  . Diastasis recti 04/02/2018  . Diverticulosis of colon 04/02/2018  . Fatty liver disease, nonalcoholic 04/02/2018  . Hiatal hernia 04/02/2018  . History of pancreatitis 04/02/2018  . Bipolar disorder with depression (HCC) 12/14/2017  . Behavior-irritability 07/12/2016  . Vitamin D deficiency 07/12/2016  . Incarcerated ventral hernia 02/19/2016  . Seizure (HCC) 05/18/2015    Past Surgical History:  Procedure Laterality Date  . CYST REMOVAL TRUNK    . ESOPHAGOGASTRODUODENOSCOPY (EGD) WITH PROPOFOL N/A 10/17/2016   Procedure: ESOPHAGOGASTRODUODENOSCOPY (EGD) WITH PROPOFOL;  Surgeon: Kieth Brightly, MD;  Location: [facility] ENDOSCOPY;  Service: Endoscopy;  Laterality: N/A;  . VENTRAL HERNIA REPAIR  02/19/2016   Dr Evette Cristal  . VENTRAL HERNIA REPAIR N/A 02/19/2016   Procedure: incarcerated ventral hernia repair;  Surgeon: Kieth Brightly, MD;  Location: [facility] ORS;  Service: General;  Laterality: N/A;  . VENTRAL HERNIA REPAIR N/A 12/07/2016   Procedure: LAPAROSCOPIC VENTRAL HERNIA  WITH MESH;  Surgeon: Kieth Brightly, MD;  Location: [facility] ORS;  Service: General;  Laterality: N/A;    Prior to Admission medications   Medication Sig Start Date End Date Taking? Authorizing Provider  acetaminophen (TYLENOL) 500 MG tablet Take 500 mg by mouth every 6 (six) hours as needed for pain.    [provider]  albuterol (PROVENTIL HFA;VENTOLIN HFA) 108 (90 BASE) MCG/ACT inhaler Inhale 2 puffs into the lungs every 6 (six) hours as needed for wheezing or shortness of breath. 03/06/15   Jene Every, MD  Alum & Mag Hydroxide-Simeth (MYLANTA MAXIMUM STRENGTH PO)  10/28/19   [provider]  clindamycin (CLEOCIN) 300 MG capsule Take 1 capsule (300 mg total) by mouth 3 (three) times daily for 10 days. 12/08/19 12/18/19  Nikolina Simerson, Rulon Eisenmenger B, FNP  doxycycline (VIBRAMYCIN) 100 MG capsule Take 1 capsule (100 mg total) by mouth 2 (two) times daily. 12/09/19   Equilla Que, Rulon Eisenmenger B, FNP  ergocalciferol (VITAMIN D2) 1.25 MG (50000 UT) capsule Take by mouth. 10/24/19 01/22/20  [provider]  esomeprazole (NEXIUM) 40 MG capsule Take 40 mg by mouth every morning. 10/19/19   [provider]  meloxicam (MOBIC) 7.5 MG tablet Take 7.5 mg by mouth 2 (two) times daily. 11/17/19   [provider]  oxyCODONE-acetaminophen (PERCOCET) 5-325 MG tablet Take 1 tablet by mouth 2 (two) times daily as needed. 01/18/19   Emily Filbert, MD  polyethylene glycol [facility] / Ethelene Hal) packet Take 17 g by mouth daily. 05/15/17   Emily Filbert, MD  senna-docusate (SENOKOT-S) 8.6-50 MG tablet  Take 1 tablet by mouth daily as needed for up to 15 doses for mild constipation or moderate constipation. 08/27/17   Rockne Menghini, MD  sertraline (ZOLOFT) 50 MG tablet Take 50 mg by mouth at bedtime.    [provider]  triamcinolone ointment (KENALOG) 0.5 % Apply 1 application topically 2 (two) times daily. 01/23/15   Kem Boroughs B, FNP    Allergies Atenolol, Lamictal  [lamotrigine], Lisinopril, Losartan, Celexa [citalopram hydrobromide], Penicillins, Prednisone, and Sulfa antibiotics  Family History  Problem Relation Age of Onset  . Transient ischemic attack Father   . Breast cancer Sister 17    Social History Social History   Tobacco Use  . Smoking status: Current Every Day Smoker    Packs/day: 0.25    Types: Cigars  . Smokeless tobacco: Never Used  . Tobacco comment: 2 cigars per day  Vaping Use  . Vaping Use: Former  Substance Use Topics  . Alcohol use: No  . Drug use: No    Review of Systems  Constitutional: Negative for fever. Respiratory: Negative for cough or shortness of breath.  Musculoskeletal: Negative for myalgias Skin: Positive for left axillary abscess. Neurological: Negative for numbness or paresthesias. ____________________________________________   PHYSICAL EXAM:  VITAL SIGNS: ED Triage Vitals [12/08/19 1007]  Enc Vitals Group     BP (!) 147/68     Pulse Rate 89     Resp 18     Temp 98.7 F (37.1 C)     Temp Source Oral     SpO2 98 %     Weight 267 lb (121.1 kg)     Height 5\' 11"  (1.803 m)     Head Circumference      Peak Flow      Pain Score 5     Pain Loc      Pain Edu?      Excl. in GC?      Constitutional: Overall well appearing. Eyes: Conjunctivae are clear without discharge or drainage. Nose: No rhinorrhea noted. Mouth/Throat: Airway is patent.  Neck: No stridor. Unrestricted range of motion observed. Cardiovascular: Capillary refill is <3 seconds.  Respiratory: Respirations are even and unlabored.. Musculoskeletal: Unrestricted range of motion observed. Neurologic: Awake, alert, and oriented x 4.  Skin: Left axillary abscess with fluctuance and induration and surrounding erythema.  ____________________________________________   LABS (all labs ordered are listed, but only abnormal results are displayed)  Labs Reviewed - No data to  display ____________________________________________  EKG  Not indicated. ____________________________________________  RADIOLOGY  Not indicated ____________________________________________   PROCEDURES  . Incision and Drainage  Date/Time: 12/15/2019 12:33 AM Performed by: 12/17/2019, FNP Authorized by: Chinita Pester, FNP   Consent:    Consent obtained:  Verbal   Consent given by:  Patient   Risks discussed:  Incomplete drainage and infection Location:    Type:  Abscess   Location:  Upper extremity   Upper extremity location:  Arm   Arm location:  R upper arm Pre-procedure details:    Skin preparation:  Betadine Anesthesia (see MAR for exact dosages):    Anesthesia method:  Local infiltration   Local anesthetic:  Lidocaine 1% w/o epi Procedure type:    Complexity:  Complex Procedure details:    Incision types:  Single straight   Incision depth:  Dermal   Scalpel blade:  11   Wound management:  Probed and deloculated   Drainage:  Purulent   Drainage amount:  Copious   Wound treatment:  Drain placed   Packing materials:  1/4 in iodoform gauze Post-procedure details:    Patient tolerance of procedure:  Tolerated well, no immediate complications   ____________________________________________   INITIAL IMPRESSION / ASSESSMENT AND PLAN / ED COURSE  Kristina Schultz is a 49 y.o. female presents to the emergency department for treatment and evaluation of abscess of the left axillary area that has been present for the last 2 days.  See HPI for further details.  Incision and drainage performed as listed above.  Patient tolerated procedure well.  She will remove the packing in 2 days unless she feels that the area is still draining.  If still draining she will return to the emergency department.   Prescriptions for clindamycin and hydrocodone written and sent to her pharmacy.  Medications  lidocaine (PF) (XYLOCAINE) 1 % injection 5 mL (5 mLs Intradermal Given by  Other 12/08/19 1121)     Pertinent labs & imaging results that were available during my care of the patient were reviewed by me and considered in my medical decision making (see chart for details).  ____________________________________________   FINAL CLINICAL IMPRESSION(S) / ED DIAGNOSES  Final diagnoses:  Abscess of axilla, left    ED Discharge Orders         Ordered    clindamycin (CLEOCIN) 300 MG capsule  3 times daily     Discontinue  Reprint     12/08/19 1138    HYDROcodone-acetaminophen (NORCO/VICODIN) 5-325 MG tablet  Every 6 hours PRN     Reprint     12/08/19 1138           Note:  This document was prepared using Dragon voice recognition software and may include unintentional dictation errors.   Chinita Pester, FNP 12/15/19 0865    Willy Eddy, MD 12/15/19 1201

## 2020-01-21 ENCOUNTER — Other Ambulatory Visit: Payer: Self-pay | Admitting: Physician Assistant

## 2020-01-21 DIAGNOSIS — M2391 Unspecified internal derangement of right knee: Secondary | ICD-10-CM

## 2020-02-10 ENCOUNTER — Other Ambulatory Visit: Payer: Self-pay

## 2020-02-10 ENCOUNTER — Ambulatory Visit
Admission: RE | Admit: 2020-02-10 | Discharge: 2020-02-10 | Disposition: A | Discharge status: Home / Self Care | Payer: Medicare Other | Source: Ambulatory Visit | Attending: Physician Assistant | Admitting: Physician Assistant

## 2020-02-10 DIAGNOSIS — M2391 Unspecified internal derangement of right knee: Secondary | ICD-10-CM | POA: Diagnosis present

## 2020-03-23 ENCOUNTER — Other Ambulatory Visit: Payer: Self-pay

## 2020-03-23 MED ORDER — ZENPEP 5000-24000 UNITS PO CPEP: ORAL_CAPSULE | ORAL | 3 refills | Status: DC

## 2020-08-24 ENCOUNTER — Emergency Department: Payer: Medicare Other

## 2020-08-24 ENCOUNTER — Encounter: Payer: Self-pay | Admitting: *Deleted

## 2020-08-24 ENCOUNTER — Observation Stay
Admission: EM | Admit: 2020-08-24 | Discharge: 2020-08-24 | Disposition: A | Discharge status: Home / Self Care | Payer: Medicare Other | Attending: Surgery | Admitting: Surgery

## 2020-08-24 ENCOUNTER — Emergency Department: Payer: Medicare Other | Admitting: Certified Registered Nurse Anesthetist

## 2020-08-24 ENCOUNTER — Other Ambulatory Visit: Payer: Self-pay

## 2020-08-24 ENCOUNTER — Encounter
Admission: EM | Disposition: A | Discharge status: Home / Self Care | Payer: Self-pay | Source: Home / Self Care | Attending: Emergency Medicine

## 2020-08-24 DIAGNOSIS — Z20822 Contact with and (suspected) exposure to covid-19: Secondary | ICD-10-CM | POA: Insufficient documentation

## 2020-08-24 DIAGNOSIS — R1010 Upper abdominal pain, unspecified: Secondary | ICD-10-CM

## 2020-08-24 DIAGNOSIS — K353 Acute appendicitis with localized peritonitis, without perforation or gangrene: Secondary | ICD-10-CM | POA: Diagnosis not present

## 2020-08-24 DIAGNOSIS — Z79899 Other long term (current) drug therapy: Secondary | ICD-10-CM | POA: Insufficient documentation

## 2020-08-24 DIAGNOSIS — Y900 Blood alcohol level of less than 20 mg/100 ml: Secondary | ICD-10-CM | POA: Diagnosis not present

## 2020-08-24 DIAGNOSIS — E860 Dehydration: Secondary | ICD-10-CM

## 2020-08-24 DIAGNOSIS — I10 Essential (primary) hypertension: Secondary | ICD-10-CM | POA: Insufficient documentation

## 2020-08-24 DIAGNOSIS — R112 Nausea with vomiting, unspecified: Secondary | ICD-10-CM

## 2020-08-24 DIAGNOSIS — J45909 Unspecified asthma, uncomplicated: Secondary | ICD-10-CM | POA: Diagnosis not present

## 2020-08-24 DIAGNOSIS — K3580 Unspecified acute appendicitis: Principal | ICD-10-CM

## 2020-08-24 DIAGNOSIS — R197 Diarrhea, unspecified: Secondary | ICD-10-CM

## 2020-08-24 DIAGNOSIS — F17290 Nicotine dependence, other tobacco product, uncomplicated: Secondary | ICD-10-CM | POA: Diagnosis not present

## 2020-08-24 DIAGNOSIS — E871 Hypo-osmolality and hyponatremia: Secondary | ICD-10-CM

## 2020-08-24 HISTORY — PX: LAPAROSCOPIC APPENDECTOMY: SHX408

## 2020-08-24 LAB — COMPREHENSIVE METABOLIC PANEL
ALT: 13 U/L (ref 0–44)
AST: 21 U/L (ref 15–41)
Albumin: 4 g/dL (ref 3.5–5.0)
Alkaline Phosphatase: 87 U/L (ref 38–126)
Anion gap: 9 (ref 5–15)
BUN: 9 mg/dL (ref 6–20)
CO2: 23 mmol/L (ref 22–32)
Calcium: 9.1 mg/dL (ref 8.9–10.3)
Chloride: 98 mmol/L (ref 98–111)
Creatinine, Ser: 0.85 mg/dL (ref 0.44–1.00)
GFR, Estimated: >60 mL/min (ref >60)
Glucose, Bld: 160 mg/dL — ABNORMAL HIGH (ref 70–99)
Potassium: 3.9 mmol/L (ref 3.5–5.1)
Sodium: 130 mmol/L — ABNORMAL LOW (ref 135–145)
Total Bilirubin: 0.5 mg/dL (ref 0.3–1.2)
Total Protein: 8.2 g/dL — ABNORMAL HIGH (ref 6.5–8.1)

## 2020-08-24 LAB — RESP PANEL BY RT-PCR (FLU A&B, COVID) ARPGX2
Influenza A by PCR: NEGATIVE
Influenza B by PCR: NEGATIVE
SARS Coronavirus 2 by RT PCR: NEGATIVE

## 2020-08-24 LAB — URINALYSIS, COMPLETE (UACMP) WITH MICROSCOPIC
Bilirubin Urine: NEGATIVE
Glucose, UA: 150 mg/dL — AB
Ketones, ur: 5 mg/dL — AB
Leukocytes,Ua: NEGATIVE
Nitrite: NEGATIVE
Protein, ur: 100 mg/dL — AB
Specific Gravity, Urine: 1.016 (ref 1.005–1.030)
pH: 9 — ABNORMAL HIGH (ref 5.0–8.0)

## 2020-08-24 LAB — CBC WITH DIFFERENTIAL/PLATELET
Abs Immature Granulocytes: 0.06 10*3/uL (ref 0.00–0.07)
Basophils Absolute: 0 10*3/uL (ref 0.0–0.1)
Basophils Relative: 0 %
Eosinophils Absolute: 0 10*3/uL (ref 0.0–0.5)
Eosinophils Relative: 0 %
HCT: 37.8 % (ref 36.0–46.0)
Hemoglobin: 12 g/dL (ref 12.0–15.0)
Immature Granulocytes: 1 %
Lymphocytes Relative: 9 %
Lymphs Abs: 1.1 10*3/uL (ref 0.7–4.0)
MCH: 28.7 pg (ref 26.0–34.0)
MCHC: 31.7 g/dL (ref 30.0–36.0)
MCV: 90.4 fL (ref 80.0–100.0)
Monocytes Absolute: 0.6 10*3/uL (ref 0.1–1.0)
Monocytes Relative: 4 %
Neutro Abs: 11 10*3/uL — ABNORMAL HIGH (ref 1.7–7.7)
Neutrophils Relative %: 86 %
Platelets: 235 10*3/uL (ref 150–400)
RBC: 4.18 MIL/uL (ref 3.87–5.11)
RDW: 13.2 % (ref 11.5–15.5)
WBC: 12.8 10*3/uL — ABNORMAL HIGH (ref 4.0–10.5)
nRBC: 0 % (ref 0.0–0.2)

## 2020-08-24 LAB — TROPONIN I (HIGH SENSITIVITY)
Troponin I (High Sensitivity): 4 ng/L (ref <18)
Troponin I (High Sensitivity): 3 ng/L (ref <18)

## 2020-08-24 LAB — LIPASE, BLOOD: Lipase: 26 U/L (ref 11–51)

## 2020-08-24 LAB — ETHANOL: Alcohol, Ethyl (B): <10 mg/dL (ref <10)

## 2020-08-24 LAB — POC URINE PREG, ED: Preg Test, Ur: NEGATIVE

## 2020-08-24 SURGERY — APPENDECTOMY, LAPAROSCOPIC
Anesthesia: General

## 2020-08-24 MED ORDER — ALBUTEROL SULFATE HFA 108 (90 BASE) MCG/ACT IN AERS
INHALATION_SPRAY | RESPIRATORY_TRACT | Status: DC | PRN
  Administered 2020-08-24: 4 via RESPIRATORY_TRACT

## 2020-08-24 MED ORDER — KETOROLAC TROMETHAMINE 30 MG/ML IJ SOLN
30.0000 mg | Freq: Four times a day (QID) | INTRAMUSCULAR | Status: DC
  Administered 2020-08-24: 30 mg via INTRAVENOUS
  Filled 2020-08-24: qty 1

## 2020-08-24 MED ORDER — LIDOCAINE HCL (PF) 2 % IJ SOLN
INTRAMUSCULAR | Status: AC
  Filled 2020-08-24: qty 5

## 2020-08-24 MED ORDER — ONDANSETRON HCL 4 MG/2ML IJ SOLN
INTRAMUSCULAR | Status: AC
  Filled 2020-08-24: qty 2

## 2020-08-24 MED ORDER — ACETAMINOPHEN 10 MG/ML IV SOLN
INTRAVENOUS | Status: DC | PRN
  Administered 2020-08-24: 1000 mg via INTRAVENOUS

## 2020-08-24 MED ORDER — SUCCINYLCHOLINE CHLORIDE 200 MG/10ML IV SOSY
PREFILLED_SYRINGE | INTRAVENOUS | Status: AC
  Filled 2020-08-24: qty 10

## 2020-08-24 MED ORDER — LIDOCAINE HCL (CARDIAC) PF 100 MG/5ML IV SOSY
PREFILLED_SYRINGE | INTRAVENOUS | Status: DC | PRN
  Administered 2020-08-24: 100 mg via INTRAVENOUS

## 2020-08-24 MED ORDER — FENTANYL CITRATE (PF) 100 MCG/2ML IJ SOLN
INTRAMUSCULAR | Status: AC
  Filled 2020-08-24: qty 2

## 2020-08-24 MED ORDER — EPHEDRINE 5 MG/ML INJ
INTRAVENOUS | Status: AC
  Filled 2020-08-24: qty 10

## 2020-08-24 MED ORDER — ROCURONIUM BROMIDE 10 MG/ML (PF) SYRINGE
PREFILLED_SYRINGE | INTRAVENOUS | Status: AC
  Filled 2020-08-24: qty 10

## 2020-08-24 MED ORDER — SUCCINYLCHOLINE CHLORIDE 20 MG/ML IJ SOLN
INTRAMUSCULAR | Status: DC | PRN
  Administered 2020-08-24: 120 mg via INTRAVENOUS

## 2020-08-24 MED ORDER — ROCURONIUM BROMIDE 100 MG/10ML IV SOLN
INTRAVENOUS | Status: DC | PRN
  Administered 2020-08-24: 40 mg via INTRAVENOUS
  Administered 2020-08-24: 10 mg via INTRAVENOUS

## 2020-08-24 MED ORDER — HYDROMORPHONE HCL 1 MG/ML IJ SOLN: 0.5000 mg | INTRAMUSCULAR | Status: DC | PRN

## 2020-08-24 MED ORDER — MIDAZOLAM HCL 2 MG/2ML IJ SOLN
INTRAMUSCULAR | Status: DC | PRN
  Administered 2020-08-24: 2 mg via INTRAVENOUS

## 2020-08-24 MED ORDER — SODIUM CHLORIDE 0.9 % IV SOLN: INTRAVENOUS | Status: DC

## 2020-08-24 MED ORDER — HYDROMORPHONE HCL 1 MG/ML IJ SOLN
0.5000 mg | INTRAMUSCULAR | Status: DC | PRN
Start: 2020-08-24 — End: 2020-08-24

## 2020-08-24 MED ORDER — EPHEDRINE SULFATE 50 MG/ML IJ SOLN
INTRAMUSCULAR | Status: DC | PRN
  Administered 2020-08-24 (×3): 10 mg via INTRAVENOUS

## 2020-08-24 MED ORDER — FENTANYL CITRATE (PF) 100 MCG/2ML IJ SOLN
INTRAMUSCULAR | Status: DC | PRN
  Administered 2020-08-24 (×3): 50 ug via INTRAVENOUS

## 2020-08-24 MED ORDER — BUPIVACAINE-EPINEPHRINE (PF) 0.5% -1:200000 IJ SOLN
INTRAMUSCULAR | Status: AC
  Filled 2020-08-24: qty 30

## 2020-08-24 MED ORDER — ONDANSETRON HCL 4 MG/2ML IJ SOLN
4.0000 mg | Freq: Once | INTRAMUSCULAR | Status: AC
  Administered 2020-08-24: 4 mg via INTRAVENOUS
  Filled 2020-08-24: qty 2

## 2020-08-24 MED ORDER — PIPERACILLIN-TAZOBACTAM 3.375 G IVPB
3.3750 g | Freq: Three times a day (TID) | INTRAVENOUS | Status: DC
  Administered 2020-08-24: 3.375 g via INTRAVENOUS
  Filled 2020-08-24: qty 50

## 2020-08-24 MED ORDER — ONDANSETRON HCL 4 MG/2ML IJ SOLN: 4.0000 mg | Freq: Four times a day (QID) | INTRAMUSCULAR | Status: DC | PRN

## 2020-08-24 MED ORDER — ONDANSETRON HCL 4 MG/2ML IJ SOLN: 4.0000 mg | Freq: Once | INTRAMUSCULAR | Status: DC | PRN

## 2020-08-24 MED ORDER — LACTATED RINGERS IV SOLN: INTRAVENOUS | Status: DC | PRN

## 2020-08-24 MED ORDER — HYDROMORPHONE HCL 1 MG/ML IJ SOLN
1.0000 mg | Freq: Once | INTRAMUSCULAR | Status: AC
  Administered 2020-08-24: 1 mg via INTRAVENOUS
  Filled 2020-08-24: qty 1

## 2020-08-24 MED ORDER — SUGAMMADEX SODIUM 200 MG/2ML IV SOLN
INTRAVENOUS | Status: DC | PRN
  Administered 2020-08-24: 249.4 mg via INTRAVENOUS

## 2020-08-24 MED ORDER — IOHEXOL 300 MG/ML  SOLN
125.0000 mL | Freq: Once | INTRAMUSCULAR | Status: AC | PRN
  Administered 2020-08-24: 125 mL via INTRAVENOUS

## 2020-08-24 MED ORDER — KETOROLAC TROMETHAMINE 30 MG/ML IJ SOLN
INTRAMUSCULAR | Status: DC | PRN
  Administered 2020-08-24: 30 mg via INTRAVENOUS

## 2020-08-24 MED ORDER — DEXAMETHASONE SODIUM PHOSPHATE 10 MG/ML IJ SOLN
INTRAMUSCULAR | Status: AC
  Filled 2020-08-24: qty 1

## 2020-08-24 MED ORDER — SODIUM CHLORIDE 0.9 % IV BOLUS
1000.0000 mL | Freq: Once | INTRAVENOUS | Status: AC
  Administered 2020-08-24: 1000 mL via INTRAVENOUS

## 2020-08-24 MED ORDER — FAMOTIDINE IN NACL 20-0.9 MG/50ML-% IV SOLN
20.0000 mg | Freq: Once | INTRAVENOUS | Status: AC
  Administered 2020-08-24: 20 mg via INTRAVENOUS
  Filled 2020-08-24: qty 50

## 2020-08-24 MED ORDER — ONDANSETRON 4 MG PO TBDP: 4.0000 mg | ORAL_TABLET | Freq: Four times a day (QID) | ORAL | Status: DC | PRN

## 2020-08-24 MED ORDER — PROPOFOL 10 MG/ML IV BOLUS
INTRAVENOUS | Status: DC | PRN
Start: 2020-08-24 — End: 2020-08-24
  Administered 2020-08-24: 200 mg via INTRAVENOUS

## 2020-08-24 MED ORDER — ACETAMINOPHEN 10 MG/ML IV SOLN
INTRAVENOUS | Status: AC
  Filled 2020-08-24: qty 100

## 2020-08-24 MED ORDER — ACETAMINOPHEN 500 MG PO TABS
1000.0000 mg | ORAL_TABLET | Freq: Four times a day (QID) | ORAL | Status: DC
  Administered 2020-08-24 (×2): 1000 mg via ORAL
  Filled 2020-08-24 (×2): qty 2

## 2020-08-24 MED ORDER — OXYCODONE HCL 5 MG PO TABS: 5.0000 mg | ORAL_TABLET | ORAL | 0 refills | Status: DC | PRN

## 2020-08-24 MED ORDER — ONDANSETRON HCL 4 MG/2ML IJ SOLN
INTRAMUSCULAR | Status: DC | PRN
  Administered 2020-08-24: 4 mg via INTRAVENOUS

## 2020-08-24 MED ORDER — METRONIDAZOLE 500 MG PO TABS: 500.0000 mg | ORAL_TABLET | Freq: Three times a day (TID) | ORAL | 0 refills | Status: AC

## 2020-08-24 MED ORDER — PHENYLEPHRINE HCL (PRESSORS) 10 MG/ML IV SOLN
INTRAVENOUS | Status: DC | PRN
  Administered 2020-08-24 (×4): 100 ug via INTRAVENOUS

## 2020-08-24 MED ORDER — OXYCODONE HCL 5 MG PO TABS: 5.0000 mg | ORAL_TABLET | ORAL | Status: DC | PRN

## 2020-08-24 MED ORDER — IOHEXOL 9 MG/ML PO SOLN
500.0000 mL | ORAL | Status: AC
  Administered 2020-08-24 (×2): 500 mL via ORAL

## 2020-08-24 MED ORDER — BUPIVACAINE-EPINEPHRINE (PF) 0.5% -1:200000 IJ SOLN
INTRAMUSCULAR | Status: DC | PRN
  Administered 2020-08-24: 30 mL via PERINEURAL

## 2020-08-24 MED ORDER — DEXAMETHASONE SODIUM PHOSPHATE 10 MG/ML IJ SOLN
INTRAMUSCULAR | Status: DC | PRN
  Administered 2020-08-24: 10 mg via INTRAVENOUS

## 2020-08-24 MED ORDER — SUGAMMADEX SODIUM 500 MG/5ML IV SOLN
INTRAVENOUS | Status: AC
  Filled 2020-08-24: qty 5

## 2020-08-24 MED ORDER — MIDAZOLAM HCL 2 MG/2ML IJ SOLN
INTRAMUSCULAR | Status: AC
  Filled 2020-08-24: qty 2

## 2020-08-24 MED ORDER — CIPROFLOXACIN HCL 500 MG PO TABS: 500.0000 mg | ORAL_TABLET | Freq: Two times a day (BID) | ORAL | 0 refills | Status: AC

## 2020-08-24 MED ORDER — FENTANYL CITRATE (PF) 100 MCG/2ML IJ SOLN: 25.0000 ug | INTRAMUSCULAR | Status: DC | PRN

## 2020-08-24 MED ORDER — FENTANYL CITRATE (PF) 100 MCG/2ML IJ SOLN
50.0000 ug | Freq: Once | INTRAMUSCULAR | Status: AC
  Administered 2020-08-24: 50 ug via INTRAVENOUS
  Filled 2020-08-24: qty 2

## 2020-08-24 MED ORDER — IBUPROFEN 600 MG PO TABS: 600.0000 mg | ORAL_TABLET | Freq: Three times a day (TID) | ORAL | 1 refills | Status: DC | PRN

## 2020-08-24 MED ORDER — PIPERACILLIN-TAZOBACTAM 3.375 G IVPB 30 MIN
3.3750 g | Freq: Once | INTRAVENOUS | Status: AC
  Administered 2020-08-24: 3.375 g via INTRAVENOUS
  Filled 2020-08-24: qty 50

## 2020-08-24 MED ORDER — PIPERACILLIN-TAZOBACTAM 3.375 G IVPB: 3.3750 g | Freq: Three times a day (TID) | INTRAVENOUS | Status: DC

## 2020-08-24 SURGICAL SUPPLY — 38 items
CHLORAPREP W/TINT 26 (MISCELLANEOUS) ×2 IMPLANT
COVER WAND RF STERILE (DRAPES) ×2 IMPLANT
CUTTER FLEX LINEAR 45M (STAPLE) ×2 IMPLANT
DERMABOND ADVANCED (GAUZE/BANDAGES/DRESSINGS) ×1
DERMABOND ADVANCED .7 DNX12 (GAUZE/BANDAGES/DRESSINGS) ×1 IMPLANT
ELECT CAUTERY BLADE 6.4 (BLADE) ×2 IMPLANT
ELECT REM PT RETURN 9FT ADLT (ELECTROSURGICAL) ×2
ELECTRODE REM PT RTRN 9FT ADLT (ELECTROSURGICAL) ×1 IMPLANT
GLOVE SURG SYN 7.0 (GLOVE) ×2 IMPLANT
GLOVE SURG SYN 7.5  E (GLOVE) ×1
GLOVE SURG SYN 7.5 E (GLOVE) ×1 IMPLANT
GOWN STRL REUS W/ TWL LRG LVL3 (GOWN DISPOSABLE) ×2 IMPLANT
GOWN STRL REUS W/TWL LRG LVL3 (GOWN DISPOSABLE) ×2
IRRIGATION STRYKERFLOW (MISCELLANEOUS) ×1 IMPLANT
IRRIGATOR STRYKERFLOW (MISCELLANEOUS) ×2
IV NS 1000ML (IV SOLUTION) ×1
IV NS 1000ML BAXH (IV SOLUTION) ×1 IMPLANT
KIT TURNOVER KIT A (KITS) ×2 IMPLANT
LABEL OR SOLS (LABEL) ×2 IMPLANT
LIGASURE LAP MARYLAND 5MM 37CM (ELECTROSURGICAL) ×2 IMPLANT
MANIFOLD NEPTUNE II (INSTRUMENTS) ×2 IMPLANT
NEEDLE HYPO 22GX1.5 SAFETY (NEEDLE) ×2 IMPLANT
NS IRRIG 500ML POUR BTL (IV SOLUTION) ×2 IMPLANT
PACK LAP CHOLECYSTECTOMY (MISCELLANEOUS) ×2 IMPLANT
PENCIL ELECTRO HAND CTR (MISCELLANEOUS) ×2 IMPLANT
POUCH SPECIMEN RETRIEVAL 10MM (ENDOMECHANICALS) ×2 IMPLANT
RELOAD STAPLE TA45 3.5 REG BLU (ENDOMECHANICALS) ×2 IMPLANT
SCISSORS METZENBAUM CVD 33 (INSTRUMENTS) ×2 IMPLANT
SLEEVE ADV FIXATION 5X100MM (TROCAR) ×2 IMPLANT
SUT MNCRL 4-0 (SUTURE) ×1
SUT MNCRL 4-0 27XMFL (SUTURE) ×1
SUT VICRYL 0 AB UR-6 (SUTURE) ×2 IMPLANT
SUTURE MNCRL 4-0 27XMF (SUTURE) ×1 IMPLANT
SYS KII FIOS ACCESS ABD 5X100 (TROCAR) ×2
SYSTEM KII FIOS ACES ABD 5X100 (TROCAR) ×1 IMPLANT
TRAY FOLEY MTR SLVR 16FR STAT (SET/KITS/TRAYS/PACK) ×2 IMPLANT
TROCAR BALLN GELPORT 12X130M (ENDOMECHANICALS) ×2 IMPLANT
TUBING EVAC SMOKE HEATED PNEUM (TUBING) ×2 IMPLANT

## 2020-08-24 NOTE — Anesthesia Postprocedure Evaluation (Signed)
Anesthesia Post Note  Patient: CREOLA KROTZ  Procedure(s) Performed: APPENDECTOMY LAPAROSCOPIC (N/A )  Patient location during evaluation: PACU Anesthesia Type: General Level of consciousness: awake and alert Pain management: pain level controlled Vital Signs Assessment: post-procedure vital signs reviewed and stable Respiratory status: spontaneous breathing, nonlabored ventilation, respiratory function stable and patient connected to nasal cannula oxygen Cardiovascular status: blood pressure returned to baseline and stable Postop Assessment: no apparent nausea or vomiting Anesthetic complications: no   No complications documented.   Last Vitals:  Vitals:   08/24/20 1230 08/24/20 1245  BP: (!) 149/81 133/72  Pulse: 92 87  Resp: (!) 22 20  Temp:    SpO2: 100% 93%    Last Pain:  Vitals:   08/24/20 1245  TempSrc:   PainSc: 0-No pain                 Yevette Edwards

## 2020-08-24 NOTE — Anesthesia Preprocedure Evaluation (Signed)
Anesthesia Evaluation  Patient identified by MRN, date of birth, ID band Patient awake    Reviewed: Allergy & Precautions, H&P , NPO status , Patient's Chart, lab work & pertinent test results, reviewed documented beta blocker date and time   History of Anesthesia Complications (+) history of anesthetic complications  Airway Mallampati: II  TM Distance: >3 FB Neck ROM: full    Dental  (+) Teeth Intact   Pulmonary asthma , Current Smoker,    Pulmonary exam normal        Cardiovascular Exercise Tolerance: Good hypertension, On Medications negative cardio ROS Normal cardiovascular exam Rhythm:regular Rate:Normal     Neuro/Psych Seizures -,  PSYCHIATRIC DISORDERS Bipolar Disorder  Neuromuscular disease    GI/Hepatic Neg liver ROS, hiatal hernia, GERD  Medicated,  Endo/Other  negative endocrine ROS  Renal/GU negative Renal ROS  negative genitourinary   Musculoskeletal   Abdominal   Peds  Hematology negative hematology ROS (+)   Anesthesia Other Findings Past Medical History: No date: Asthma No date: Bipolar 1 disorder (HCC) No date: Complication of anesthesia     Comment:  PREFERS NOT TO HAVE GAS IF POSSIBLE/STATES SHE SMELLS               AND TASTE IT FOR DAYS No date: Fatty liver No date: GERD (gastroesophageal reflux disease) No date: Hyperlipemia No date: Hypertension 2017: Seizures (HCC)     Comment:  LAST 2017 Past Surgical History: No date: CYST REMOVAL TRUNK 10/17/2016: ESOPHAGOGASTRODUODENOSCOPY (EGD) WITH PROPOFOL; N/A     Comment:  Procedure: ESOPHAGOGASTRODUODENOSCOPY (EGD) WITH               PROPOFOL;  Surgeon: Kieth Brightly, MD;                Location: [facility] ENDOSCOPY;  Service: Endoscopy;                Laterality: N/A; 02/19/2016: VENTRAL HERNIA REPAIR     Comment:  Dr Evette Cristal 02/19/2016: VENTRAL HERNIA REPAIR; N/A     Comment:  Procedure: incarcerated ventral hernia repair;   Surgeon:              Kieth Brightly, MD;  Location: [facility] ORS;  Service:              General;  Laterality: N/A; 12/07/2016: VENTRAL HERNIA REPAIR; N/A     Comment:  Procedure: LAPAROSCOPIC VENTRAL HERNIA WITH MESH;                Surgeon: Kieth Brightly, MD;  Location: [facility] ORS;              Service: General;  Laterality: N/A; BMI    Body Mass Index: 38.35 kg/m     Reproductive/Obstetrics negative OB ROS                             Anesthesia Physical Anesthesia Plan  ASA: II and emergent  Anesthesia Plan: General ETT   Post-op Pain Management:    Induction:   PONV Risk Score and Plan: 3  Airway Management Planned:   Additional Equipment:   Intra-op Plan:   Post-operative Plan:   Informed Consent: I have reviewed the patients History and Physical, chart, labs and discussed the procedure including the risks, benefits and alternatives for the proposed anesthesia with the patient or authorized representative who has indicated his/her understanding and acceptance.     Dental Advisory Given  Plan Discussed with: CRNA  Anesthesia Plan Comments:         Anesthesia Quick Evaluation

## 2020-08-24 NOTE — ED Notes (Signed)
Called pharmacy to re time next zosyn dose.

## 2020-08-24 NOTE — Anesthesia Procedure Notes (Signed)
Procedure Name: Intubation Performed by: Janila Arrazola, CRNA Pre-anesthesia Checklist: Patient identified, Patient being monitored, Timeout performed, Emergency Drugs available and Suction available Patient Re-evaluated:Patient Re-evaluated prior to induction Oxygen Delivery Method: Circle system utilized Preoxygenation: Pre-oxygenation with 100% oxygen Induction Type: IV induction Ventilation: Mask ventilation without difficulty Laryngoscope Size: 3 and McGraph Grade View: Grade I Tube type: Oral Tube size: 7.0 mm Number of attempts: 1 Airway Equipment and Method: Stylet and Video-laryngoscopy Placement Confirmation: ETT inserted through vocal cords under direct vision,  positive ETCO2 and breath sounds checked- equal and bilateral Secured at: 22 cm Tube secured with: Tape Dental Injury: Teeth and Oropharynx as per pre-operative assessment        

## 2020-08-24 NOTE — ED Provider Notes (Signed)
[facility] Emergency Department Provider Note   ____________________________________________   Event Date/Time   First MD Initiated Contact with Patient 08/24/20 0216     (approximate)  I have reviewed the triage vital signs and the nursing notes.   HISTORY  Chief Complaint Abdominal Pain    HPI Kristina Schultz is a 50 y.o. female who presents to the ED from home with a chief complaint of upper abdominal pain, vomiting and diarrhea.  Patient reports onset of upper abdominal pain approximately 4 PM.  Ate a grilled chicken sandwich for lunch.  Symptoms accompanied by nausea and vomiting.  She took Mylanta which then caused some loose stools.  Denies fever, cough, chest pain, shortness of breath, dysuria.  History of pancreatitis which patient states was attributed to lisinopril use.     Past Medical History:  Diagnosis Date  . Asthma   . Bipolar 1 disorder (HCC)   . Complication of anesthesia    PREFERS NOT TO HAVE GAS IF POSSIBLE/STATES SHE SMELLS AND TASTE IT FOR DAYS  . Fatty liver   . GERD (gastroesophageal reflux disease)   . Hyperlipemia   . Hypertension   . Seizures (HCC) 2017   LAST 2017    Patient Active Problem List   Diagnosis Date Noted  . Aortic atherosclerosis (HCC) 04/02/2018  . Diastasis recti 04/02/2018  . Diverticulosis of colon 04/02/2018  . Fatty liver disease, nonalcoholic 04/02/2018  . Hiatal hernia 04/02/2018  . History of pancreatitis 04/02/2018  . Bipolar disorder with depression (HCC) 12/14/2017  . Behavior-irritability 07/12/2016  . Vitamin D deficiency 07/12/2016  . Incarcerated ventral hernia 02/19/2016  . Seizure (HCC) 05/18/2015    Past Surgical History:  Procedure Laterality Date  . CYST REMOVAL TRUNK    . ESOPHAGOGASTRODUODENOSCOPY (EGD) WITH PROPOFOL N/A 10/17/2016   Procedure: ESOPHAGOGASTRODUODENOSCOPY (EGD) WITH PROPOFOL;  Surgeon: Kieth BrightlySankar, Seeplaputhur G, MD;  Location: [facility] ENDOSCOPY;  Service:  Endoscopy;  Laterality: N/A;  . VENTRAL HERNIA REPAIR  02/19/2016   Dr Evette CristalSankar  . VENTRAL HERNIA REPAIR N/A 02/19/2016   Procedure: incarcerated ventral hernia repair;  Surgeon: Kieth BrightlySeeplaputhur G Sankar, MD;  Location: [facility] ORS;  Service: General;  Laterality: N/A;  . VENTRAL HERNIA REPAIR N/A 12/07/2016   Procedure: LAPAROSCOPIC VENTRAL HERNIA WITH MESH;  Surgeon: Kieth BrightlySankar, Seeplaputhur G, MD;  Location: [facility] ORS;  Service: General;  Laterality: N/A;    Prior to Admission medications   Medication Sig Start Date End Date Taking? Authorizing Provider  acetaminophen (TYLENOL) 500 MG tablet Take 500 mg by mouth every 6 (six) hours as needed for pain.   Yes [provider]  albuterol (PROVENTIL HFA;VENTOLIN HFA) 108 (90 BASE) MCG/ACT inhaler Inhale 2 puffs into the lungs every 6 (six) hours as needed for wheezing or shortness of breath. 03/06/15  Yes Jene EveryKinner, Robert, MD  alum & mag hydroxide-simeth (MAALOX PLUS) 400-400-40 MG/5ML suspension Take 5 mLs by mouth as needed. 10/28/19  Yes [provider]  DULoxetine (CYMBALTA) 30 MG capsule Take 30 mg by mouth daily. 08/15/20  Yes [provider]  levETIRAcetam (KEPPRA) 750 MG tablet Take 750 mg by mouth daily. 07/19/20  Yes [provider]  doxycycline (VIBRAMYCIN) 100 MG capsule Take 1 capsule (100 mg total) by mouth 2 (two) times daily. Patient not taking: No sig reported 12/09/19   Kem Boroughsriplett, Cari B, FNP  esomeprazole (NEXIUM) 40 MG capsule Take 40 mg by mouth every morning. Patient not taking: No sig reported 10/19/19   [provider]  meloxicam (MOBIC) 7.5 MG tablet Take 7.5 mg by mouth 2 (two) times daily. Patient not taking: No sig reported 11/17/19   [provider]  oxyCODONE-acetaminophen (PERCOCET) 5-325 MG tablet Take 1 tablet by mouth 2 (two) times daily as needed. Patient not taking: No sig reported 01/18/19   Emily Filbert, MD  Pancrelipase, Lip-Prot-Amyl, (ZENPEP) 5000-24000 units CPEP Take  1 capsule with each meal and 1 capsule with a snack Patient not taking: Reported on 08/24/2020 03/23/20   Midge Minium, MD  polyethylene glycol (MIRALAX / Ethelene Hal) packet Take 17 g by mouth daily. Patient not taking: Reported on 08/24/2020 05/15/17   Emily Filbert, MD  senna-docusate (SENOKOT-S) 8.6-50 MG tablet Take 1 tablet by mouth daily as needed for up to 15 doses for mild constipation or moderate constipation. Patient not taking: Reported on 08/24/2020 08/27/17   Rockne Menghini, MD  sertraline (ZOLOFT) 50 MG tablet Take 50 mg by mouth at bedtime. Patient not taking: Reported on 08/24/2020    [provider]  triamcinolone ointment (KENALOG) 0.5 % Apply 1 application topically 2 (two) times daily. Patient not taking: Reported on 08/24/2020 01/23/15   Kem Boroughs B, FNP    Allergies Atenolol, Lamictal [lamotrigine], Lisinopril, Losartan, Celexa [citalopram hydrobromide], Penicillins, Prednisone, and Sulfa antibiotics  Family History  Problem Relation Age of Onset  . Transient ischemic attack Father   . Breast cancer Sister 61    Social History Social History   Tobacco Use  . Smoking status: Current Every Day Smoker    Packs/day: 0.25    Types: Cigars  . Smokeless tobacco: Never Used  . Tobacco comment: 2 cigars per day  Vaping Use  . Vaping Use: Former  Substance Use Topics  . Alcohol use: No  . Drug use: No    Review of Systems  Constitutional: No fever/chills Eyes: No visual changes. ENT: No sore throat. Cardiovascular: Denies chest pain. Respiratory: Denies shortness of breath. Gastrointestinal: Positive for abdominal pain, nausea, vomiting and diarrhea.  No constipation. Genitourinary: Negative for dysuria. Musculoskeletal: Negative for back pain. Skin: Negative for rash. Neurological: Negative for headaches, focal weakness or numbness.   ____________________________________________   PHYSICAL EXAM:  VITAL SIGNS: ED Triage Vitals   Enc Vitals Group     BP 08/24/20 0211 (!) 150/89     Pulse Rate 08/24/20 0211 81     Resp 08/24/20 0211 (!) 22     Temp 08/24/20 0211 (!) 97.5 F (36.4 C)     Temp Source 08/24/20 0211 Oral     SpO2 08/24/20 0211 98 %     Weight 08/24/20 0210 275 lb (124.7 kg)     Height 08/24/20 0210 5\' 11"  (1.803 m)     Head Circumference --      Peak Flow --      Pain Score 08/24/20 0210 10     Pain Loc --      Pain Edu? --      Excl. in GC? --     Constitutional: Alert and oriented. Well appearing and in mild acute distress. Eyes: Conjunctivae are normal. PERRL. EOMI. Head: Atraumatic. Nose: No congestion/rhinnorhea. Mouth/Throat: Mucous membranes are moist.   Neck: No stridor.   Cardiovascular: Normal rate, regular rhythm. Grossly normal heart sounds.  Good peripheral circulation. Respiratory: Normal respiratory effort.  No retractions. Lungs CTAB. Gastrointestinal: Soft and mildly tender to palpation epigastrium without rebound or guarding. No distention. No abdominal bruits. No CVA tenderness. Musculoskeletal: No lower extremity tenderness nor edema.  No joint effusions. Neurologic:  Normal speech and language. No gross focal neurologic deficits are appreciated. No gait instability. Skin:  Skin is warm, dry and intact. No rash noted. Psychiatric: Mood and affect are normal. Speech and behavior are normal.  ____________________________________________   LABS (all labs ordered are listed, but only abnormal results are displayed)  Labs Reviewed  CBC WITH DIFFERENTIAL/PLATELET - Abnormal; Notable for the following components:      Result Value   WBC 12.8 (*)    Neutro Abs 11.0 (*)    All other components within normal limits  COMPREHENSIVE METABOLIC PANEL - Abnormal; Notable for the following components:   Sodium 130 (*)    Glucose, Bld 160 (*)    Total Protein 8.2 (*)    All other components within normal limits  URINALYSIS, COMPLETE (UACMP) WITH MICROSCOPIC - Abnormal; Notable  for the following components:   Color, Urine YELLOW (*)    APPearance CLEAR (*)    pH 9.0 (*)    Glucose, UA 150 (*)    Hgb urine dipstick MODERATE (*)    Ketones, ur 5 (*)    Protein, ur 100 (*)    Bacteria, UA FEW (*)    All other components within normal limits  RESP PANEL BY RT-PCR (FLU A&B, COVID) ARPGX2  ETHANOL  LIPASE, BLOOD  POC URINE PREG, ED  TROPONIN I (HIGH SENSITIVITY)  TROPONIN I (HIGH SENSITIVITY)   ____________________________________________  EKG  ED ECG REPORT I, Leondra Cullin J, the attending physician, personally viewed and interpreted this ECG.   Date: 08/24/2020  EKG Time: 0251  Rate: 70  Rhythm: normal EKG, normal sinus rhythm  Axis: Normal  Intervals:Prolonged PR interval  ST&T Change: Nonspecific  ____________________________________________  RADIOLOGY I, Taivon Haroon J, personally viewed and evaluated these images (plain radiographs) as part of my medical decision making, as well as reviewing the written report by the radiologist.  ED MD interpretation: Ultrasound demonstrates fatty liver; CT demonstrates retrocecal appendicitis  Official radiology report(s): CT Abdomen Pelvis W Contrast  Result Date: 08/24/2020 CLINICAL DATA:  50 year old female with postprandial upper abdominal pain yesterday, vomiting and diarrhea. EXAM: CT ABDOMEN AND PELVIS WITH CONTRAST TECHNIQUE: Multidetector CT imaging of the abdomen and pelvis was performed using the standard protocol following bolus administration of intravenous contrast. CONTRAST:  OMNIPAQUE IOHEXOL 300 MG/ML  SOLN COMPARISON:  CT Abdomen and Pelvis 10/16/2019 and earlier. FINDINGS: Lower chest: Negative. Hepatobiliary: Negative liver and gallbladder. No bile duct enlargement. Pancreas: Negative. Spleen: Negative. Adrenals/Urinary Tract: Stable. The urinary bladder is moderately distended today. Symmetric renal enhancement and contrast excretion. Stomach/Bowel: Extensive diverticulosis in the large bowel  from the splenic flexure through the sigmoid, although no active inflammation there. Occasional diverticula in the right colon. Abnormally enlarged and inflamed retrocecal appendix (coronal image 61). Appendix: Location: Retrocecal, extending cephalad in the right gutter Diameter: Up to 9 mm Appendicolith: None identified Mucosal hyper-enhancement: Positive Extraluminal gas: Negative Periappendiceal collection: Trace free fluid in the gutter with para appendiceal inflammation. No organized or drainable collection. Terminal ileum and distal small bowel remain within normal limits. Oral contrast has not yet reached the TI. The stomach is mildly distended with contrast. Negative duodenum. Small fat containing midline supraumbilical hernia is stable since last year. No free air. Vascular/Lymphatic: Aortoiliac calcified atherosclerosis. Major arterial structures are patent. No lymphadenopathy. Reproductive: Negative. Other: No pelvic free fluid. Musculoskeletal: Stable visualized osseous structures. IMPRESSION: 1. Positive for Acute Appendicitis. Trace free fluid in the right gutter but no evidence of  perforation or abscess. 2. No other acute finding. Extensive large bowel diverticulosis without active inflammation. Aortic Atherosclerosis (ICD10-I70.0). Electronically Signed   By: Odessa Fleming M.D.   On: 08/24/2020 06:40   US ABDOMEN LIMITED RUQ (LIVER/GB)  Result Date: 08/24/2020 CLINICAL DATA:  Epigastric pain for 1 day EXAM: ULTRASOUND ABDOMEN LIMITED RIGHT UPPER QUADRANT COMPARISON:  10/16/2019 FINDINGS: Gallbladder: Gallbladder is well distended. No wall thickening or pericholecystic fluid is noted. Negative sonographic Murphy's sign is elicited. No evidence of cholelithiasis or gallbladder sludge is seen. Common bile duct: Diameter: 4.8 mm Liver: Increased echogenicity consistent with fatty infiltration. No acute abnormality noted. Portal vein is patent on color Doppler imaging with normal direction of blood flow  towards the liver. Other: None. IMPRESSION: Fatty liver Electronically Signed   By: Alcide Clever M.D.   On: 08/24/2020 03:48    ____________________________________________   PROCEDURES  Procedure(s) performed (including Critical Care):  .1-3 Lead EKG Interpretation Performed by: Irean Hong, MD Authorized by: Irean Hong, MD     Interpretation: normal     ECG rate:  80   ECG rate assessment: normal     Rhythm: sinus rhythm     Ectopy: none     Conduction: normal   Comments:     Patient placed on cardiac monitor to evaluate for arrhythmias     ____________________________________________   INITIAL IMPRESSION / ASSESSMENT AND PLAN / ED COURSE  As part of my medical decision making, I reviewed the following data within the electronic MEDICAL RECORD NUMBER Nursing notes reviewed and incorporated, Labs reviewed, EKG interpreted, Old chart reviewed, Radiograph reviewed and Notes from prior ED visits     50 year old female presenting with upper abdominal pain, nausea, vomiting and diarrhea. Differential diagnosis includes, but is not limited to, biliary disease (biliary colic, acute cholecystitis, cholangitis, choledocholithiasis, etc), intrathoracic causes for epigastric abdominal pain including ACS, gastritis, duodenitis, pancreatitis, small bowel or large bowel obstruction, abdominal aortic aneurysm, hernia, and ulcer(s).  We will obtain lab work including LFTs/lipase, obtain RUQ ultrasound.  Initiate IV fluids, IV Fentanyl for pain paired with IV Zofran for nausea.  Will reassess.  Clinical Course as of 08/24/20 0713  Wed Aug 24, 2020  0403 Updated patient on all test results thus far.  She is still very uncomfortable, no relief after IV fentanyl.  Will administer additional IV analgesia and proceed with CT abdomen/pelvis to evaluate etiology of patient's symptoms. [JS]  0539 Pain down to 2/10; patient resting comfortably on her cell phone. [JS]  [id number] Updated patient on CT  imaging results.  Discussed with Dr. Aleen Campi from general surgery who does recommend preoperative antibiotic dose. [JS]    Clinical Course User Index [JS] Irean Hong, MD     ____________________________________________   FINAL CLINICAL IMPRESSION(S) / ED DIAGNOSES  Final diagnoses:  Pain of upper abdomen  Hyponatremia  Nausea vomiting and diarrhea  Dehydration  Acute appendicitis, unspecified acute appendicitis type     ED Discharge Orders    None      *Please note:  EMMANUEL ERCOLE was evaluated in Emergency Department on 08/24/2020 for the symptoms described in the history of present illness. She was evaluated in the context of the global COVID-19 pandemic, which necessitated consideration that the patient might be at risk for infection with the SARS-CoV-2 virus that causes COVID-19. Institutional protocols and algorithms that pertain to the evaluation of patients at risk for COVID-19 are in a state of rapid change based on information released  by regulatory bodies including the CDC and federal and state organizations. These policies and algorithms were followed during the patient's care in the ED.  Some ED evaluations and interventions may be delayed as a result of limited staffing during and the pandemic.*   Note:  This document was prepared using Dragon voice recognition software and may include unintentional dictation errors.   Irean Hong, MD 08/24/20 [phone]

## 2020-08-24 NOTE — Plan of Care (Signed)
  Problem: Activity: Goal: Risk for activity intolerance will decrease Outcome: Progressing   Problem: Nutrition: Goal: Adequate nutrition will be maintained Outcome: Progressing   Problem: Coping: Goal: Level of anxiety will decrease Outcome: Progressing   Problem: Elimination: Goal: Will not experience complications related to urinary retention Outcome: Progressing   Problem: Pain Managment: Goal: General experience of comfort will improve Outcome: Progressing   Problem: Safety: Goal: Ability to remain free from injury will improve Outcome: Progressing   

## 2020-08-24 NOTE — Progress Notes (Signed)
Myrtice Lauth to be D/C'd home per MD order.  Discussed prescriptions and follow up appointments with the patient. Prescriptions given to patient, medication list explained in detail. Pt verbalized understanding.  Allergies as of 08/24/2020      Reactions   Atenolol Anaphylaxis   SWELLING, BREATHING ISSUES   Lamictal [lamotrigine] Rash   rash   Lisinopril Other (See Comments)   Pancreatitis   Losartan    Leg pain   Celexa [citalopram Hydrobromide] Rash   Nosebleed/rash   Penicillins Rash, Other (See Comments)   Has patient had a PCN reaction causing immediate rash, facial/tongue/throat swelling, SOB or lightheadedness with hypotension: No Has patient had a PCN reaction causing severe rash involving mucus membranes or skin necrosis: Yes Has patient had a PCN reaction that required hospitalization: No Has patient had a PCN reaction occurring within the last 10 years: No If all of the above answers are "NO", then may proceed with Cephalosporin use GAVE PATIENT A UTI   Prednisone Other (See Comments)   Dizzy and nausea. STOMACH CRAMPS   Sulfa Antibiotics Rash   Rash, blisters      Medication List    TAKE these medications   acetaminophen 500 MG tablet Commonly known as: TYLENOL Take 500 mg by mouth every 6 (six) hours as needed for pain.   albuterol 108 (90 Base) MCG/ACT inhaler Commonly known as: VENTOLIN HFA Inhale 2 puffs into the lungs every 6 (six) hours as needed for wheezing or shortness of breath.   alum & mag hydroxide-simeth 400-400-40 MG/5ML suspension Commonly known as: MAALOX PLUS Take 5 mLs by mouth as needed.   ciprofloxacin 500 MG tablet Commonly known as: Cipro Take 1 tablet (500 mg total) by mouth 2 (two) times daily for 10 days.   DULoxetine 30 MG capsule Commonly known as: CYMBALTA Take 30 mg by mouth daily.   ibuprofen 600 MG tablet Commonly known as: ADVIL Take 1 tablet (600 mg total) by mouth every 8 (eight) hours as needed for moderate pain.    levETIRAcetam 750 MG tablet Commonly known as: KEPPRA Take 750 mg by mouth daily.   metroNIDAZOLE 500 MG tablet Commonly known as: Flagyl Take 1 tablet (500 mg total) by mouth 3 (three) times daily for 10 days.   oxyCODONE 5 MG immediate release tablet Commonly known as: Oxy IR/ROXICODONE Take 1 tablet (5 mg total) by mouth every 4 (four) hours as needed for severe pain.            Discharge Care Instructions  (From admission, onward)         Start     Ordered   08/24/20 0000  No dressing needed        08/24/20 1810          Vitals:   08/24/20 1300 08/24/20 1322  BP: 137/76 131/82  Pulse: 86 87  Resp: (!) 21 16  Temp:  98 F (36.7 C)  SpO2: 93% 96%    Skin clean, dry and intact without evidence of skin break down, no evidence of skin tears noted. IV catheter discontinued intact. Site without signs and symptoms of complications. Dressing and pressure applied. Pt denies pain at this time. No complaints noted.  An After Visit Summary was printed and given to the patient. Patient escorted via WC, and D/C home via private auto.  Madie Reno, RN

## 2020-08-24 NOTE — Op Note (Signed)
  Procedure Date:  08/24/2020  Pre-operative Diagnosis:  Acute appendicitis  Post-operative Diagnosis:  Acute appendicitis  Procedure:  Laparoscopic appendectomy  Surgeon:  Howie Ill, MD  Anesthesia:  General endotracheal  Estimated Blood Loss:  10 ml  Specimens:  appendix  Complications:  None  Indications for Procedure:  This is a 50 y.o. female who presents with abdominal pain and workup revealing acute appendicitis.  The options of surgery versus observation were reviewed with the patient and/or family. The risks of bleeding, infection, recurrence of symptoms, negative laparoscopy, potential for an open procedure, bowel injury, abscess or infection, were all discussed with the patient and she was willing to proceed.  Description of Procedure: The patient was correctly identified in the preoperative area and brought into the operating room.  The patient was placed supine with VTE prophylaxis in place.  Appropriate time-outs were performed.  Anesthesia was induced and the patient was intubated.  Foley catheter was placed.  Appropriate antibiotics were infused.  The abdomen was prepped and draped in a sterile fashion. An infraumbilical incision was made. A cutdown technique was used to enter the abdominal cavity without injury, and a Hasson trocar was inserted.  Pneumoperitoneum was obtained with appropriate opening pressures.  Two 5-mm ports were placed in the suprapubic and left lateral positions under direct visualization.  The right abdomen was inspected and the appendix was confirmed to be in retrocecal position. It was inflamed distally without any evidence of rupture.  The appendix was carefully dissected.  The mesoappendix was divided using the LigaSure.  The base of the appendix was dissected out and divided with a standard load Endo GIA.  The appendix was placed in an Endocatch bag.  The right lower quadrant was then inspected again revealing an intact staple line, no  bleeding, and no bowel injury.  The area was thoroughly irrigated.  The 5 mm ports were removed under direct visualization and the Hasson trocar was removed.  The Endocatch bag was brought out through the umbilical incision.  The fascial opening was closed using 0 vicryl suture.  Local anesthetic was infused in all incisions and the incisions were closed with 4-0 Monocryl.  The wounds were cleaned and sealed with DermaBond.  Foley catheter was removed and the patient was emerged from anesthesia and extubated and brought to the recovery room for further management.  The patient tolerated the procedure well and all counts were correct at the end of the case.   Howie Ill, MD

## 2020-08-24 NOTE — H&P (Signed)
[facility] SURGICAL HISTORY & PHYSICAL (cpt [phone])  HISTORY OF PRESENT ILLNESS (HPI):  50 y.o. female presented to [facility] ED overnight for abdominal pain. Patient reports the acute onset of upper abdominal pain yesterday afternoon after eating a grilled cheese sandwich. The pain was accompianied by nausea and emesis. She tried to take Mylanta without any relief. No fever, chills, cough. CP, SOB, or urinary changes. She does endorse a history of pancreatitis and this felt similar. She does have a history of ventral hernia repair with Dr Evette Cristal in the past. Work up in the ED was concerning for leukocytosis to 12.8K, and CT Abdomen/Pelvis was concerning for acute uncomplicated appendicitis.   General surgery is consulted by emergency medicine physician Dr Chiquita Loth, MD for evaluation and management of acute uncomplicated appendicitis.   PAST MEDICAL HISTORY (PMH):  Past Medical History:  Diagnosis Date  . Asthma   . Bipolar 1 disorder (HCC)   . Complication of anesthesia    PREFERS NOT TO HAVE GAS IF POSSIBLE/STATES SHE SMELLS AND TASTE IT FOR DAYS  . Fatty liver   . GERD (gastroesophageal reflux disease)   . Hyperlipemia   . Hypertension   . Seizures (HCC) 2017   LAST 2017    Reviewed. Otherwise negative.   PAST SURGICAL HISTORY (PSH):  Past Surgical History:  Procedure Laterality Date  . CYST REMOVAL TRUNK    . ESOPHAGOGASTRODUODENOSCOPY (EGD) WITH PROPOFOL N/A 10/17/2016   Procedure: ESOPHAGOGASTRODUODENOSCOPY (EGD) WITH PROPOFOL;  Surgeon: Kieth Brightly, MD;  Location: [facility] ENDOSCOPY;  Service: Endoscopy;  Laterality: N/A;  . VENTRAL HERNIA REPAIR  02/19/2016   Dr Evette Cristal  . VENTRAL HERNIA REPAIR N/A 02/19/2016   Procedure: incarcerated ventral hernia repair;  Surgeon: Kieth Brightly, MD;  Location: [facility] ORS;  Service: General;  Laterality: N/A;  . VENTRAL HERNIA REPAIR N/A 12/07/2016   Procedure: LAPAROSCOPIC VENTRAL HERNIA WITH MESH;  Surgeon: Kieth Brightly, MD;  Location: [facility] ORS;  Service: General;  Laterality: N/A;    Reviewed. Otherwise negative.   MEDICATIONS:  Prior to Admission medications   Medication Sig Start Date End Date Taking? Authorizing Provider  acetaminophen (TYLENOL) 500 MG tablet Take 500 mg by mouth every 6 (six) hours as needed for pain.   Yes [provider]  albuterol (PROVENTIL HFA;VENTOLIN HFA) 108 (90 BASE) MCG/ACT inhaler Inhale 2 puffs into the lungs every 6 (six) hours as needed for wheezing or shortness of breath. 03/06/15  Yes Jene Every, MD  alum & mag hydroxide-simeth (MAALOX PLUS) 400-400-40 MG/5ML suspension Take 5 mLs by mouth as needed. 10/28/19  Yes [provider]  DULoxetine (CYMBALTA) 30 MG capsule Take 30 mg by mouth daily. 08/15/20  Yes [provider]  levETIRAcetam (KEPPRA) 750 MG tablet Take 750 mg by mouth daily. 07/19/20  Yes [provider]  doxycycline (VIBRAMYCIN) 100 MG capsule Take 1 capsule (100 mg total) by mouth 2 (two) times daily. Patient not taking: No sig reported 12/09/19   Kem Boroughs B, FNP  esomeprazole (NEXIUM) 40 MG capsule Take 40 mg by mouth every morning. Patient not taking: No sig reported 10/19/19   [provider]  meloxicam (MOBIC) 7.5 MG tablet Take 7.5 mg by mouth 2 (two) times daily. Patient not taking: No sig reported 11/17/19   [provider]  oxyCODONE-acetaminophen (PERCOCET) 5-325 MG tablet Take 1 tablet by mouth 2 (two) times daily as needed. Patient not taking: No sig reported 01/18/19   Emily Filbert, MD  Pancrelipase, Lip-Prot-Amyl, (ZENPEP) 5000-24000 units CPEP Take 1 capsule with each meal and 1 capsule with a snack Patient not taking: Reported on 08/24/2020 03/23/20   Midge Minium, MD  polyethylene glycol (MIRALAX / Ethelene Hal) packet Take 17 g by mouth daily. Patient not taking: Reported on 08/24/2020 05/15/17   Emily Filbert, MD  senna-docusate (SENOKOT-S) 8.6-50 MG tablet Take  1 tablet by mouth daily as needed for up to 15 doses for mild constipation or moderate constipation. Patient not taking: Reported on 08/24/2020 08/27/17   Rockne Menghini, MD  sertraline (ZOLOFT) 50 MG tablet Take 50 mg by mouth at bedtime. Patient not taking: Reported on 08/24/2020    [provider]  triamcinolone ointment (KENALOG) 0.5 % Apply 1 application topically 2 (two) times daily. Patient not taking: Reported on 08/24/2020 01/23/15   Chinita Pester, FNP     ALLERGIES:  Allergies  Allergen Reactions  . Atenolol Anaphylaxis    SWELLING, BREATHING ISSUES  . Lamictal [Lamotrigine] Rash    rash  . Lisinopril Other (See Comments)    Pancreatitis   . Losartan     Leg pain  . Celexa [Citalopram Hydrobromide] Rash    Nosebleed/rash  . Penicillins Rash and Other (See Comments)    Has patient had a PCN reaction causing immediate rash, facial/tongue/throat swelling, SOB or lightheadedness with hypotension: No Has patient had a PCN reaction causing severe rash involving mucus membranes or skin necrosis: Yes Has patient had a PCN reaction that required hospitalization: No Has patient had a PCN reaction occurring within the last 10 years: No If all of the above answers are "NO", then may proceed with Cephalosporin use GAVE PATIENT A UTI   . Prednisone Other (See Comments)    Dizzy and nausea. STOMACH CRAMPS  . Sulfa Antibiotics Rash    Rash, blisters     SOCIAL HISTORY:  Social History   Socioeconomic History  . Marital status: Single    Spouse name: Not on file  . Number of children: Not on file  . Years of education: Not on file  . Highest education level: Not on file  Occupational History  . Not on file  Tobacco Use  . Smoking status: Current Every Day Smoker    Packs/day: 0.25    Types: Cigars  . Smokeless tobacco: Never Used  . Tobacco comment: 2 cigars per day  Vaping Use  . Vaping Use: Former  Substance and Sexual Activity  . Alcohol use: No  .  Drug use: No  . Sexual activity: Not on file  Other Topics Concern  . Not on file  Social History Narrative  . Not on file   Social Determinants of Health   Financial Resource Strain: Not on file  Food Insecurity: Not on file  Transportation Needs: Not on file  Physical Activity: Not on file  Stress: Not on file  Social Connections: Not on file  Intimate Partner Violence: Not on file     FAMILY HISTORY:  Family History  Problem Relation Age of Onset  . Transient ischemic attack Father   . Breast cancer Sister 71    Otherwise negative.   REVIEW OF SYSTEMS:  Review of Systems  Constitutional: Negative for chills and fever.  HENT: Negative for congestion and sore throat.   Respiratory: Negative for cough and shortness of breath.   Cardiovascular: Negative for chest pain and palpitations.  Gastrointestinal: Positive for abdominal pain, nausea and vomiting. Negative for constipation and diarrhea.  Genitourinary: Negative  for dysuria and urgency.  All other systems reviewed and are negative.   VITAL SIGNS:  Temp:  [97.5 F (36.4 C)] 97.5 F (36.4 C) (03/30 0211) Pulse Rate:  [67-81] 72 (03/30 0600) Resp:  [12-22] 12 (03/30 0600) BP: (125-178)/(89-123) 167/107 (03/30 0600) SpO2:  [94 %-100 %] 100 % (03/30 0600) Weight:  [124.7 kg] 124.7 kg (03/30 0210)     Height: 5\' 11"  (180.3 cm) Weight: 124.7 kg BMI (Calculated): 38.37   PHYSICAL EXAM:  Physical Exam Vitals and nursing note reviewed. Exam conducted with a chaperone present.  Constitutional:      General: She is not in acute distress.    Appearance: She is well-developed. She is obese. She is not ill-appearing.  Eyes:     General: No scleral icterus.    Extraocular Movements: Extraocular movements intact.  Cardiovascular:     Rate and Rhythm: Normal rate.     Heart sounds: Normal heart sounds.  Pulmonary:     Effort: Pulmonary effort is normal. No respiratory distress.  Abdominal:     Palpations: Abdomen is  soft.     Tenderness: There is abdominal tenderness in the right lower quadrant. There is no guarding or rebound.  Genitourinary:    Comments: Deferred Skin:    General: Skin is warm and dry.     Coloration: Skin is not jaundiced or pale.  Neurological:     General: No focal deficit present.     Mental Status: She is alert and oriented to person, place, and time.  Psychiatric:        Mood and Affect: Mood normal.        Behavior: Behavior normal.     INTAKE/OUTPUT:  This shift: No intake/output data recorded.  Last 2 shifts: @IOLAST2SHIFTS @  Labs:  CBC Latest Ref Rng & Units 08/24/2020 10/16/2019 09/26/2017  WBC 4.0 - 10.5 K/uL 12.8(H) 8.6 6.1  Hemoglobin 12.0 - 15.0 g/dL 81.112.0 91.412.9 78.212.7  Hematocrit 36.0 - 46.0 % 37.8 37.8 36.9  Platelets 150 - 400 K/uL 235 267 263   CMP Latest Ref Rng & Units 08/24/2020 10/16/2019 09/26/2017  Glucose 70 - 99 mg/dL 956(O160(H) 93 98  BUN 6 - 20 mg/dL 9 11 9   Creatinine 0.44 - 1.00 mg/dL 1.300.85 8.650.94 7.840.87  Sodium 135 - 145 mmol/L 130(L) 133(L) 131(L)  Potassium 3.5 - 5.1 mmol/L 3.9 4.3 3.8  Chloride 98 - 111 mmol/L 98 100 100(L)  CO2 22 - 32 mmol/L 23 23 25   Calcium 8.9 - 10.3 mg/dL 9.1 9.4 6.9(G8.8(L)  Total Protein 6.5 - 8.1 g/dL 8.2(H) 8.2(H) 7.5  Total Bilirubin 0.3 - 1.2 mg/dL 0.5 0.8 0.5  Alkaline Phos 38 - 126 U/L 87 82 95  AST 15 - 41 U/L 21 19 21   ALT 0 - 44 U/L 13 14 14     Imaging studies:   CT Abdomen/Pelvis (08/24/2020) personally reviewed and consistent with acute uncomplicated appendicitis, and radiologist report reviewed below:  IMPRESSION: 1. Positive for Acute Appendicitis. Trace free fluid in the right gutter but no evidence of perforation or abscess.  2. No other acute finding. Extensive large bowel diverticulosis without active inflammation.   Assessment/Plan: (ICD-10's: K35.30) 50 y.o. female with leukocytosis and abdominal pain concerning acute uncomplicated appendicitis.    - Will admit to general surgery  - Will plan for  laparoscopic appendectomy this afternoon with Dr Aleen CampiPiscoya pending OR/Anesthesia availability   - All risks, benefits, and alternatives to above procedure(s) were discussed with the patient, all of  her questions were answered to her expressed satisfaction, patient expresses she wishes to proceed, and informed consent was obtained.  - NPO + IVF   - IV ABx (Zosyn)  - Monitor abdominal examination  - Pain control prn; antiemetics prn   - DVT prophylaxis; hold for OR  All of the above findings and recommendations were discussed with the patient, and all of her questions were answered to her expressed satisfaction.  -- Lynden Oxford, PA-C [facility] 08/24/2020, 7:14 AM [phone] M-F: 7am - 4pm

## 2020-08-24 NOTE — Discharge Summary (Signed)
[facility] SURGICAL DISCHARGE SUMMARY  Patient ID: Kristina Schultz MRN: [id number] DOB/AGE: 49-Oct-1972 49 y.o.  Admit date: 08/24/2020 Discharge date: 08/24/2020  Discharge Diagnoses Patient Active Problem List   Diagnosis Date Noted  . Acute appendicitis 08/24/2020   Consultants None  Procedures 08/24/2020:  Laparoscopic appendectomy  HPI: 50 y.o. female presented to [facility] ED overnight for abdominal pain. Patient reports the acute onset of upper abdominal pain yesterday afternoon after eating a grilled cheese sandwich. The pain was accompianied by nausea and emesis. She tried to take Mylanta without any relief. No fever, chills, cough. CP, SOB, or urinary changes. She does endorse a history of pancreatitis and this felt similar. She does have a history of ventral hernia repair with Dr Evette Cristal in the past. Work up in the ED was concerning for leukocytosis to 12.8K, and CT Abdomen/Pelvis was concerning for acute uncomplicated appendicitis.    Hospital Course: Informed consent was obtained and documented, and patient underwent uneventful laparoscopic appendectomy (Dr Aleen Campi, 08/24/2020).  Post-operatively, patient's pain/symptoms improved/resolved and advancement of patient's diet and ambulation were well-tolerated. The remainder of patient's hospital course was essentially unremarkable, and discharge planning was initiated accordingly with patient safely able to be discharged home with appropriate discharge instructions, pain control, and outpatient follow-up after all of her questions were answered to her expressed satisfaction.   Discharge Condition: Good   Physical Examination:  Constitutional: Well appearing female, NAD Pulmonary: Normal effort, no respiratory distress Gastrointestinal: soft, non-tender, non-distended, no rebound/guarding Skin: Laparoscopic incisions are CDI with dermabond, no erythema or drainage    Allergies as of 08/24/2020      Reactions    Atenolol Anaphylaxis   SWELLING, BREATHING ISSUES   Lamictal [lamotrigine] Rash   rash   Lisinopril Other (See Comments)   Pancreatitis   Losartan    Leg pain   Celexa [citalopram Hydrobromide] Rash   Nosebleed/rash   Penicillins Rash, Other (See Comments)   Has patient had a PCN reaction causing immediate rash, facial/tongue/throat swelling, SOB or lightheadedness with hypotension: No Has patient had a PCN reaction causing severe rash involving mucus membranes or skin necrosis: Yes Has patient had a PCN reaction that required hospitalization: No Has patient had a PCN reaction occurring within the last 10 years: No If all of the above answers are "NO", then may proceed with Cephalosporin use GAVE PATIENT A UTI   Prednisone Other (See Comments)   Dizzy and nausea. STOMACH CRAMPS   Sulfa Antibiotics Rash   Rash, blisters      Medication List    TAKE these medications   acetaminophen 500 MG tablet Commonly known as: TYLENOL Take 500 mg by mouth every 6 (six) hours as needed for pain.   albuterol 108 (90 Base) MCG/ACT inhaler Commonly known as: VENTOLIN HFA Inhale 2 puffs into the lungs every 6 (six) hours as needed for wheezing or shortness of breath.   alum & mag hydroxide-simeth 400-400-40 MG/5ML suspension Commonly known as: MAALOX PLUS Take 5 mLs by mouth as needed.   ciprofloxacin 500 MG tablet Commonly known as: Cipro Take 1 tablet (500 mg total) by mouth 2 (two) times daily for 10 days.   DULoxetine 30 MG capsule Commonly known as: CYMBALTA Take 30 mg by mouth daily.   ibuprofen 600 MG tablet Commonly known as: ADVIL Take 1 tablet (600 mg total) by mouth every 8 (eight) hours as needed for moderate pain.   levETIRAcetam 750 MG tablet Commonly known as: KEPPRA Take 750 mg by  mouth daily.   metroNIDAZOLE 500 MG tablet Commonly known as: Flagyl Take 1 tablet (500 mg total) by mouth 3 (three) times daily for 10 days.   oxyCODONE 5 MG immediate release  tablet Commonly known as: Oxy IR/ROXICODONE Take 1 tablet (5 mg total) by mouth every 4 (four) hours as needed for severe pain.            Discharge Care Instructions  (From admission, onward)         Start     Ordered   08/24/20 0000  No dressing needed        08/24/20 1810            Follow-up Information    Henrene Dodge, MD. Schedule an appointment as soon as possible for a visit in 2 week(s).   Specialty: General Surgery Why: s/p lap appy  Contact information: [address] [place] [address] [phone]                Time spent on discharge management including discussion of hospital course, clinical condition, outpatient instructions, prescriptions, and follow up with the patient and members of the medical team: >30 minutes  -- Lynden Oxford , PA-C [facility]  08/24/2020, 6:11 PM [phone] M-F: 7am - 4pm

## 2020-08-24 NOTE — Discharge Instructions (Signed)
In addition to included general post-operative instructions for laparoscopic appendectomy  Diet: Resume home diet.   Activity: No heavy lifting >20 pounds (children, pets, laundry, garbage) for 4 weeks, but light activity and walking are encouraged. Do not drive or drink alcohol if taking narcotic pain medications or having pain that might distract from driving.  Wound care: 2 days after surgery (04/01), you may shower/get incision wet with soapy water and pat dry (do not rub incisions), but no baths or submerging incision underwater until follow-up.   Medications: Resume all home medications. For mild to moderate pain: acetaminophen (Tylenol) or ibuprofen/naproxen (if no kidney disease). Combining Tylenol with alcohol can substantially increase your risk of causing liver disease. Narcotic pain medications, if prescribed, can be used for severe pain, though may cause nausea, constipation, and drowsiness. Do not combine Tylenol and Percocet (or similar) within a 6 hour period as Percocet (and similar) contain(s) Tylenol. If you do not need the narcotic pain medication, you do not need to fill the prescription.  Call office [phone] / [phone]) at any time if any questions, worsening pain, fevers/chills, bleeding, drainage from incision site, or other concerns.

## 2020-08-24 NOTE — ED Notes (Signed)
Pt finished PO contrast. CT notified

## 2020-08-24 NOTE — Transfer of Care (Signed)
Immediate Anesthesia Transfer of Care Note  Patient: KAMEO BAINS  Procedure(s) Performed: APPENDECTOMY LAPAROSCOPIC (N/A )  Patient Location: PACU  Anesthesia Type:General  Level of Consciousness: awake and alert   Airway & Oxygen Therapy: Patient Spontanous Breathing and Patient connected to face mask oxygen  Post-op Assessment: Report given to RN and Post -op Vital signs reviewed and stable  Post vital signs: Reviewed and stable  Last Vitals:  Vitals Value Taken Time  BP 155/84 08/24/20 1223  Temp    Pulse 92 08/24/20 1226  Resp 20 08/24/20 1226  SpO2 98 % 08/24/20 1226  Vitals shown include unvalidated device data.  Last Pain:  Vitals:   08/24/20 0951  TempSrc: Temporal  PainSc: 3          Complications: No complications documented.

## 2020-08-24 NOTE — ED Notes (Signed)
US at bedside

## 2020-08-24 NOTE — ED Triage Notes (Signed)
Pt ambulatory to triage.  Pt has upper abd pain   Vomited x 4 today.  Diarrhea x 3.  Pt took mylanta without relief.  Pt alert

## 2020-08-25 ENCOUNTER — Encounter: Payer: Self-pay | Admitting: Surgery

## 2020-08-25 LAB — SURGICAL PATHOLOGY

## 2020-08-26 ENCOUNTER — Encounter: Payer: Self-pay | Admitting: Surgery

## 2020-09-07 ENCOUNTER — Other Ambulatory Visit: Payer: Self-pay

## 2020-09-07 ENCOUNTER — Ambulatory Visit (INDEPENDENT_AMBULATORY_CARE_PROVIDER_SITE_OTHER): Payer: Medicare Other | Admitting: Surgery

## 2020-09-07 ENCOUNTER — Encounter: Payer: Self-pay | Admitting: Surgery

## 2020-09-07 VITALS — BP 128/83 | HR 111 | Temp 98.2°F | Ht 71.0 in | Wt 270.8 lb

## 2020-09-07 DIAGNOSIS — K353 Acute appendicitis with localized peritonitis, without perforation or gangrene: Secondary | ICD-10-CM

## 2020-09-07 NOTE — Progress Notes (Signed)
09/07/2020  HPI: Kristina Schultz is a 50 y.o. female s/p laparoscopic appendectomy on 08/24/20.  She presents today for follow up.  She reports only some discomfort at the incisions but otherwise doing well.  She's getting back more of her appetite and having better bowel function now.  She does report that after surgery she noticed a loose tooth, but she feels that is getting better.  She was wondering who she can discuss her loose tooth with as she does not have dental insurance.  Vital signs: BP 128/83   Pulse (!) 111   Temp 98.2 F (36.8 C) (Oral)   Ht 5\' 11"  (1.803 m)   Wt 270 lb 12.8 oz (122.8 kg)   LMP 08/28/2020   SpO2 97%   BMI 37.77 kg/m    Physical Exam: Constitutional:  No acute distress Abdomen:  Soft, non-distended, non-tender to palpation.  Incisions healing well, clean, dry, intact.  No signs of infection.  Assessment/Plan: This is a 50 y.o. female s/p laparoscopic appendectomy.  --Reviewed pathology results with patient -- shows acute appendicitis, negative for malignancy. --Discussed with the patient that the loose tooth could have resulted from the ETT placement, and that would be under the anesthesia team.  She could try contacting patient services or the anesthesia department for further clarification or instructions. --Follow up prn.   53, MD [facility]

## 2020-09-07 NOTE — Patient Instructions (Signed)
For your concerns, you may call Patient Services at [facility]. If you have any concerns or questions, please feel free to call our office.    GENERAL POST-OPERATIVE PATIENT INSTRUCTIONS   WOUND CARE INSTRUCTIONS:  Keep a dry clean dressing on the wound if there is drainage. The initial bandage may be removed after 24 hours.  Once the wound has quit draining you may leave it open to air.  If clothing rubs against the wound or causes irritation and the wound is not draining you may cover it with a dry dressing during the daytime.  Try to keep the wound dry and avoid ointments on the wound unless directed to do so.  If the wound becomes bright red and painful or starts to drain infected material that is not clear, please contact your physician immediately.  If the wound is mildly pink and has a thick firm ridge underneath it, this is normal, and is referred to as a healing ridge.  This will resolve over the next 4-6 weeks.  BATHING: You may shower if you have been informed of this by your surgeon. However, Please do not submerge in a tub, hot tub, or pool until incisions are completely sealed or have been told by your surgeon that you may do so.  DIET:  You may eat any foods that you can tolerate.  It is a good idea to eat a high fiber diet and take in plenty of fluids to prevent constipation.  If you do become constipated you may want to take a mild laxative or take ducolax tablets on a daily basis until your bowel habits are regular.  Constipation can be very uncomfortable, along with straining, after recent surgery.  ACTIVITY:  You are encouraged to cough and deep breath or use your incentive spirometer if you were given one, every 15-30 minutes when awake.  This will help prevent respiratory complications and low grade fevers post-operatively if you had a general anesthetic.  You may want to hug a pillow when coughing and sneezing to add additional support to the surgical area, if you had abdominal  or chest surgery, which will decrease pain during these times.  You are encouraged to walk and engage in light activity for the next two weeks.  You should not lift more than 10-20 pounds, until 09/21/2020 as it could put you at increased risk for complications.  Twenty pounds is roughly equivalent to a plastic bag of groceries. At that time- Listen to your body when lifting, if you have pain when lifting, stop and then try again in a few days. Soreness after doing exercises or activities of daily living is normal as you get back in to your normal routine.  MEDICATIONS:  Try to take narcotic medications and anti-inflammatory medications, such as tylenol, ibuprofen, naprosyn, etc., with food.  This will minimize stomach upset from the medication.  Should you develop nausea and vomiting from the pain medication, or develop a rash, please discontinue the medication and contact your physician.  You should not drive, make important decisions, or operate machinery when taking narcotic pain medication.  SUNBLOCK Use sun block to incision area over the next year if this area will be exposed to sun. This helps decrease scarring and will allow you avoid a permanent darkened area over your incision.

## 2020-12-10 ENCOUNTER — Emergency Department: Payer: Medicare Other

## 2020-12-10 ENCOUNTER — Emergency Department
Admission: EM | Admit: 2020-12-10 | Discharge: 2020-12-10 | Disposition: A | Discharge status: Home / Self Care | Payer: Medicare Other | Attending: Emergency Medicine | Admitting: Emergency Medicine

## 2020-12-10 ENCOUNTER — Other Ambulatory Visit: Payer: Self-pay

## 2020-12-10 DIAGNOSIS — I10 Essential (primary) hypertension: Secondary | ICD-10-CM | POA: Insufficient documentation

## 2020-12-10 DIAGNOSIS — F17210 Nicotine dependence, cigarettes, uncomplicated: Secondary | ICD-10-CM | POA: Diagnosis not present

## 2020-12-10 DIAGNOSIS — J45909 Unspecified asthma, uncomplicated: Secondary | ICD-10-CM | POA: Insufficient documentation

## 2020-12-10 DIAGNOSIS — K8500 Idiopathic acute pancreatitis without necrosis or infection: Secondary | ICD-10-CM | POA: Diagnosis not present

## 2020-12-10 DIAGNOSIS — N3001 Acute cystitis with hematuria: Secondary | ICD-10-CM

## 2020-12-10 DIAGNOSIS — Z79899 Other long term (current) drug therapy: Secondary | ICD-10-CM | POA: Diagnosis not present

## 2020-12-10 DIAGNOSIS — R1012 Left upper quadrant pain: Secondary | ICD-10-CM | POA: Diagnosis present

## 2020-12-10 LAB — URINALYSIS, COMPLETE (UACMP) WITH MICROSCOPIC
Bilirubin Urine: NEGATIVE
Glucose, UA: NEGATIVE mg/dL
Ketones, ur: NEGATIVE mg/dL
Leukocytes,Ua: NEGATIVE
Nitrite: NEGATIVE
Protein, ur: 30 mg/dL — AB
Specific Gravity, Urine: 1.015 (ref 1.005–1.030)
pH: 6 (ref 5.0–8.0)

## 2020-12-10 LAB — CBC
HCT: 38.7 % (ref 36.0–46.0)
Hemoglobin: 12.9 g/dL (ref 12.0–15.0)
MCH: 30.1 pg (ref 26.0–34.0)
MCHC: 33.3 g/dL (ref 30.0–36.0)
MCV: 90.4 fL (ref 80.0–100.0)
Platelets: 294 10*3/uL (ref 150–400)
RBC: 4.28 MIL/uL (ref 3.87–5.11)
RDW: 13 % (ref 11.5–15.5)
WBC: 5.9 10*3/uL (ref 4.0–10.5)
nRBC: 0 % (ref 0.0–0.2)

## 2020-12-10 LAB — COMPREHENSIVE METABOLIC PANEL
ALT: 14 U/L (ref 0–44)
AST: 22 U/L (ref 15–41)
Albumin: 3.9 g/dL (ref 3.5–5.0)
Alkaline Phosphatase: 94 U/L (ref 38–126)
Anion gap: 10 (ref 5–15)
BUN: 10 mg/dL (ref 6–20)
CO2: 22 mmol/L (ref 22–32)
Calcium: 9.1 mg/dL (ref 8.9–10.3)
Chloride: 101 mmol/L (ref 98–111)
Creatinine, Ser: 0.83 mg/dL (ref 0.44–1.00)
GFR, Estimated: >60 mL/min (ref >60)
Glucose, Bld: 137 mg/dL — ABNORMAL HIGH (ref 70–99)
Potassium: 4 mmol/L (ref 3.5–5.1)
Sodium: 133 mmol/L — ABNORMAL LOW (ref 135–145)
Total Bilirubin: 0.5 mg/dL (ref 0.3–1.2)
Total Protein: 8.1 g/dL (ref 6.5–8.1)

## 2020-12-10 LAB — POC URINE PREG, ED: Preg Test, Ur: NEGATIVE

## 2020-12-10 LAB — LIPASE, BLOOD: Lipase: 58 U/L — ABNORMAL HIGH (ref 11–51)

## 2020-12-10 MED ORDER — CEPHALEXIN 500 MG PO CAPS
500.0000 mg | ORAL_CAPSULE | Freq: Once | ORAL | Status: AC
  Administered 2020-12-10: 500 mg via ORAL
  Filled 2020-12-10: qty 1

## 2020-12-10 MED ORDER — CEPHALEXIN 500 MG PO CAPS: 500.0000 mg | ORAL_CAPSULE | Freq: Four times a day (QID) | ORAL | 0 refills | Status: AC

## 2020-12-10 MED ORDER — LIDOCAINE VISCOUS HCL 2 % MT SOLN
15.0000 mL | Freq: Once | OROMUCOSAL | Status: AC
  Administered 2020-12-10: 15 mL via ORAL
  Filled 2020-12-10: qty 15

## 2020-12-10 MED ORDER — ALUM & MAG HYDROXIDE-SIMETH 200-200-20 MG/5ML PO SUSP
30.0000 mL | Freq: Once | ORAL | Status: AC
  Administered 2020-12-10: 30 mL via ORAL
  Filled 2020-12-10: qty 30

## 2020-12-10 MED ORDER — OXYCODONE HCL 5 MG PO TABS: 5.0000 mg | ORAL_TABLET | Freq: Three times a day (TID) | ORAL | 0 refills | Status: DC | PRN

## 2020-12-10 NOTE — ED Notes (Signed)
Patient transported to CT 

## 2020-12-10 NOTE — ED Provider Notes (Signed)
[facility] Emergency Department Provider Note ____________________________________________   Event Date/Time   First MD Initiated Contact with Patient 12/10/20 [phone]     (approximate)  I have reviewed the triage vital signs and the nursing notes.  HISTORY  Chief Complaint Abdominal Pain and Flank Pain   HPI Kristina Schultz is a 50 y.o. femalewho presents to the ED for evaluation of abdominal and flank pain.  Chart review indicates history of obesity, HTN, HLD, fatty liver disease, bipolar disorder.  Patient presents to the ED for evaluation of LUQ and left flank pain over the past 5 days.  She reports consistent pain that waxes and wanes over this timeframe.  Currently reports that it feels okay and has eased off of the past few hours.  Further reports coexisting symptoms of urinary urgency, frequency and dribbling without dysuria or gross hematuria.  Denies fevers or systemic symptoms.  Denies emesis, diarrhea or stool changes, vaginal discharge or bleeding that is new.    She reports some degree of postprandial worsening of her LUQ abdominal pain and reports a remote history of GERD.  Past Medical History:  Diagnosis Date   Asthma    Bipolar 1 disorder (HCC)    Complication of anesthesia    PREFERS NOT TO HAVE GAS IF POSSIBLE/STATES SHE SMELLS AND TASTE IT FOR DAYS   Fatty liver    GERD (gastroesophageal reflux disease)    Hyperlipemia    Hypertension    Seizures (HCC) 2017   LAST 2017    Patient Active Problem List   Diagnosis Date Noted   Acute appendicitis 08/24/2020   Aortic atherosclerosis (HCC) 04/02/2018   Diastasis recti 04/02/2018   Diverticulosis of colon 04/02/2018   Fatty liver disease, nonalcoholic 04/02/2018   Hiatal hernia 04/02/2018   History of pancreatitis 04/02/2018   Bipolar disorder with depression (HCC) 12/14/2017   Behavior-irritability 07/12/2016   Vitamin D deficiency 07/12/2016   Incarcerated ventral hernia  02/19/2016   Seizure (HCC) 05/18/2015    Past Surgical History:  Procedure Laterality Date   CYST REMOVAL TRUNK     ESOPHAGOGASTRODUODENOSCOPY (EGD) WITH PROPOFOL N/A 10/17/2016   Procedure: ESOPHAGOGASTRODUODENOSCOPY (EGD) WITH PROPOFOL;  Surgeon: Kieth Brightly, MD;  Location: [facility] ENDOSCOPY;  Service: Endoscopy;  Laterality: N/A;   LAPAROSCOPIC APPENDECTOMY N/A 08/24/2020   Procedure: APPENDECTOMY LAPAROSCOPIC;  Surgeon: Henrene Dodge, MD;  Location: [facility] ORS;  Service: General;  Laterality: N/A;   VENTRAL HERNIA REPAIR  02/19/2016   Dr Kizzie Bane HERNIA REPAIR N/A 02/19/2016   Procedure: incarcerated ventral hernia repair;  Surgeon: Kieth Brightly, MD;  Location: [facility] ORS;  Service: General;  Laterality: N/A;   VENTRAL HERNIA REPAIR N/A 12/07/2016   Procedure: LAPAROSCOPIC VENTRAL HERNIA WITH MESH;  Surgeon: Kieth Brightly, MD;  Location: [facility] ORS;  Service: General;  Laterality: N/A;    Prior to Admission medications   Medication Sig Start Date End Date Taking? Authorizing Provider  cephALEXin (KEFLEX) 500 MG capsule Take 1 capsule (500 mg total) by mouth 4 (four) times daily for 5 days. 12/10/20 12/15/20 Yes Delton Prairie, MD  oxyCODONE (ROXICODONE) 5 MG immediate release tablet Take 1 tablet (5 mg total) by mouth every 8 (eight) hours as needed. 12/10/20 12/10/21 Yes Delton Prairie, MD  acetaminophen (TYLENOL) 500 MG tablet Take 500 mg by mouth every 6 (six) hours as needed for pain.    [provider]  albuterol (PROVENTIL HFA;VENTOLIN HFA) 108 (90 BASE) MCG/ACT inhaler Inhale 2 puffs  into the lungs every 6 (six) hours as needed for wheezing or shortness of breath. 03/06/15   Jene Every, MD  alum & mag hydroxide-simeth (MAALOX PLUS) 400-400-40 MG/5ML suspension Take 5 mLs by mouth as needed. 10/28/19   [provider]  DULoxetine (CYMBALTA) 30 MG capsule Take 30 mg by mouth daily. 08/15/20   [provider]  levETIRAcetam (KEPPRA) 750 MG  tablet Take 750 mg by mouth daily. 07/19/20   [provider]    Allergies Atenolol, Lamictal [lamotrigine], Lisinopril, Losartan, Celexa [citalopram hydrobromide], Penicillins, Prednisone, and Sulfa antibiotics  Family History  Problem Relation Age of Onset   Transient ischemic attack Father    Breast cancer Sister 36    Social History Social History   Tobacco Use   Smoking status: Every Day    Packs/day: 0.25    Types: Cigars, Cigarettes   Smokeless tobacco: Never   Tobacco comments:    2 cigars per day  Vaping Use   Vaping Use: Former  Substance Use Topics   Alcohol use: No   Drug use: No    Review of Systems  Constitutional: No fever/chills Eyes: No visual changes. ENT: No sore throat. Cardiovascular: Denies chest pain. Respiratory: Denies shortness of breath. Gastrointestinal: Positive for LUQ abdominal pain radiating to her left flank.  No nausea, no vomiting.  No diarrhea.  No constipation. Genitourinary: Negative for dysuria.  Positive for urgency and frequency Musculoskeletal: Negative for back pain. Skin: Negative for rash. Neurological: Negative for headaches, focal weakness or numbness.  ____________________________________________   PHYSICAL EXAM:  VITAL SIGNS: Vitals:   12/10/20 0900 12/10/20 0913  BP: (!) 153/105 (!) 148/94  Pulse: 74 73  Resp:  16  Temp:    SpO2: 96% 100%    Constitutional: Alert and oriented. Well appearing and in no acute distress.  Obese.  Sitting up in bed, pleasant and conversational. Eyes: Conjunctivae are normal. PERRL. EOMI. Head: Atraumatic. Nose: No congestion/rhinnorhea. Mouth/Throat: Mucous membranes are moist.  Oropharynx non-erythematous. Neck: No stridor. No cervical spine tenderness to palpation. Cardiovascular: Normal rate, regular rhythm. Grossly normal heart sounds.  Good peripheral circulation. Respiratory: Normal respiratory effort.  No retractions. Lungs CTAB. Gastrointestinal: Soft ,  nondistended Mild and poorly localizing tenderness to palpation from her LUQ abdomen, left flank and wrapping around to her left CVA.  No overlying skin changes or signs of trauma.  Abdomen is otherwise benign. Musculoskeletal: No lower extremity tenderness nor edema.  No joint effusions. No signs of acute trauma. Neurologic:  Normal speech and language. No gross focal neurologic deficits are appreciated. No gait instability noted. Skin:  Skin is warm, dry and intact. No rash noted. Psychiatric: Mood and affect are normal. Speech and behavior are normal. ____________________________________________   LABS (all labs ordered are listed, but only abnormal results are displayed)  Labs Reviewed  LIPASE, BLOOD - Abnormal; Notable for the following components:      Result Value   Lipase 58 (*)    All other components within normal limits  COMPREHENSIVE METABOLIC PANEL - Abnormal; Notable for the following components:   Sodium 133 (*)    Glucose, Bld 137 (*)    All other components within normal limits  URINALYSIS, COMPLETE (UACMP) WITH MICROSCOPIC - Abnormal; Notable for the following components:   Color, Urine YELLOW (*)    APPearance CLEAR (*)    Hgb urine dipstick MODERATE (*)    Protein, ur 30 (*)    Bacteria, UA FEW (*)    All  other components within normal limits  URINE CULTURE  CBC  POC URINE PREG, ED   ____________________________________________  12 Lead EKG  Sinus rhythm, rate of 83 bpm.  Normal axis and intervals.  No evidence of acute ischemia. ____________________________________________  RADIOLOGY  ED MD interpretation: CT renal study reviewed by me without evidence of obstructive uropathy  Official radiology report(s): CT Renal Stone Study  Result Date: 12/10/2020 CLINICAL DATA:  LEFT-sided flank pain. EXAM: CT ABDOMEN AND PELVIS WITHOUT CONTRAST TECHNIQUE: Multidetector CT imaging of the abdomen and pelvis was performed following the standard protocol without IV  contrast. COMPARISON:  CT 08/24/2020 FINDINGS: Lower chest: Lung bases are clear. Hepatobiliary: No focal hepatic lesion on noncontrast exam. Gallbladder normal Pancreas: There is a very subtle haziness in the peripancreatic fat surrounding the body and tail the pancreas (image 23 through 29 of series 2). Trace thickening of the anterior pararenal fascia on the LEFT (image 30/2). Potential mild edema within the pancreatic parenchyma of the tail the pancreas . The more distal body and head of the pancreas are normal. Spleen: Normal spleen Adrenals/urinary tract: Adrenal glands normal. No nephrolithiasis or ureterolithiasis. No obstructive uropathy. Stomach/Bowel: Stomach, small-bowel and cecum normal. Post appendectomy. Multiple diverticula of the descending colon and sigmoid colon without acute inflammation. Vascular/Lymphatic: Abdominal aorta is normal caliber. No periportal or retroperitoneal adenopathy. No pelvic adenopathy. Reproductive: Uterus and adnexa unremarkable. Other: No free fluid. Musculoskeletal: No aggressive osseous lesion. IMPRESSION: . 1. Concern for mild focal pancreatitis of the tail of the pancreas. Subtle findings. 2. No nephrolithiasis or ureterolithiasis. 3. Post appendectomy Electronically Signed   By: Genevive Bi M.D.   On: 12/10/2020 08:51    ____________________________________________   PROCEDURES and INTERVENTIONS  Procedure(s) performed (including Critical Care):  Procedures  Medications  alum & mag hydroxide-simeth (MAALOX/MYLANTA) 200-200-20 MG/5ML suspension 30 mL (30 mLs Oral Given 12/10/20 0800)    And  lidocaine (XYLOCAINE) 2 % viscous mouth solution 15 mL (15 mLs Oral Given 12/10/20 0800)  cephALEXin (KEFLEX) capsule 500 mg (500 mg Oral Given 12/10/20 0910)    ____________________________________________   MDM / ED COURSE   Obese 50 year old female presents to the ED with LUQ abdominal pain with evidence of mild acute, idiopathic pancreatitis and acute  cystitis, ultimately amenable to trial of outpatient management.  She looks clinically well without evidence of sepsis or systemic illness.  No significant or peritoneal abdominal tenderness.  Considering her urinary symptoms acutely, concerned about acute cystitis with hematuria, and her urinalysis was sent for culture.  Started on a course of Keflex.  CT renal study obtained without evidence of obstructive uropathy, ureterolithiasis, or peripancreatic complication.  Stranding noted consistent with mild acute pancreatitis with a lipase slightly elevated as well.  No barriers to outpatient management at this time, she has no evidence of dehydration or poorly controlled symptoms.  We will discharge with symptomatic measures, Keflex for her cystitis and return precautions for the ED.  Clinical Course as of 12/10/20 0945  Sat Dec 10, 2020  0905 Reassessed.  Patient sitting up in bed and looks well.  Her girlfriend is at bedside.  We discussed evidence of mild pancreatitis and possible acute cystitis considering her symptoms and urinalysis.  We discussed empiric treatment of this with antibiotics, we discussed management at home pancreatitis and we discussed return precautions for the ED.  Answered questions. [DS]    Clinical Course User Index [DS] Delton Prairie, MD    ____________________________________________   FINAL CLINICAL IMPRESSION(S) / ED DIAGNOSES  Final diagnoses:  Idiopathic acute pancreatitis without infection or necrosis  Acute cystitis with hematuria     ED Discharge Orders          Ordered    cephALEXin (KEFLEX) 500 MG capsule  4 times daily        12/10/20 0906    oxyCODONE (ROXICODONE) 5 MG immediate release tablet  Every 8 hours PRN        12/10/20 0906             Jovonte Commins   Note:  This document was prepared using Dragon voice recognition software and may include unintentional dictation errors.    Delton PrairieSmith, Elfreida Heggs, MD 12/10/20 [phone]

## 2020-12-10 NOTE — Discharge Instructions (Addendum)
Use Tylenol for pain and fevers.  Up to 1000 mg per dose, up to 4 times per day.  Do not take more than 4000 mg of Tylenol/acetaminophen within 24 hours..  Use naproxen/Aleve for anti-inflammatory pain relief. Use up to 500mg  every 12 hours. Do not take more frequently than this. Do not use other NSAIDs (ibuprofen, Advil) while taking this medication. It is safe to take Tylenol with this.   Use the Keflex antibiotic 4 times daily for the next 5 days.  Please finish all 20 tablets, even if your symptoms are improving.

## 2020-12-10 NOTE — ED Triage Notes (Signed)
Pt states left sided upper abd pain that radiates to flank for 5 days. Pt denies fever, vomiting. Pt appears in no acute distress. Pt states was seen at [facility] yesterday and left before being seen.

## 2020-12-11 LAB — URINE CULTURE: Culture: NO GROWTH

## 2021-01-02 ENCOUNTER — Encounter
Admission: RE | Admit: 2021-01-02 | Discharge: 2021-01-02 | Disposition: A | Discharge status: Home / Self Care | Payer: Medicare Other | Source: Ambulatory Visit | Attending: Orthopedic Surgery | Admitting: Orthopedic Surgery

## 2021-01-02 ENCOUNTER — Other Ambulatory Visit: Payer: Self-pay

## 2021-01-02 ENCOUNTER — Other Ambulatory Visit: Payer: Self-pay | Admitting: Orthopedic Surgery

## 2021-01-02 HISTORY — DX: Unspecified osteoarthritis, unspecified site: M19.90

## 2021-01-02 HISTORY — DX: Anxiety disorder, unspecified: F41.9

## 2021-01-02 NOTE — Patient Instructions (Signed)
Your procedure is scheduled on:01-10-21 Tuesday Report to the Registration Desk on the 1st floor of the Medical Mall-Then proceed to the 2nd floor Surgery Desk in the Medical Mall To find out your arrival time, please call [phone] between 1PM - 3PM on:01-09-21 Monday  REMEMBER: Instructions that are not followed completely may result in serious medical risk, up to and including death; or upon the discretion of your surgeon and anesthesiologist your surgery may need to be rescheduled.  Do not eat food after midnight the night before surgery.  No gum chewing, lozengers or hard candies.  You may however, drink CLEAR liquids up to 2 hours before you are scheduled to arrive for your surgery. Do not drink anything within 2 hours of your scheduled arrival time.  Clear liquids include: - water  - apple juice without pulp - gatorade (not RED, PURPLE, OR BLUE) - black coffee or tea (Do NOT add milk or creamers to the coffee or tea) Do NOT drink anything that is not on this list.  Do not take any medication the day of surgery  Use your Albuterol Inhaler the day of surgery and bring Inhaler to the hospital  One week prior to surgery: Stop Anti-inflammatories (NSAIDS) such as Advil, Aleve, Ibuprofen, Motrin, Naproxen, Naprosyn and Aspirin based products such as Excedrin, Goodys Powder, BC Powder.You may however, continue to take Tylenol if needed for pain up until the day of surgery.  Stop ANY OVER THE COUNTER supplements/vitamins NOW (01-02-21) until after surgery.  No Alcohol for 24 hours before or after surgery.  No Smoking including e-cigarettes for 24 hours prior to surgery.  No chewable tobacco products for at least 6 hours prior to surgery.  No nicotine patches on the day of surgery.  Do not use any "recreational" drugs for at least a week prior to your surgery.  Please be advised that the combination of cocaine and anesthesia may have negative outcomes, up to and including death. If  you test positive for cocaine, your surgery will be cancelled.  On the morning of surgery brush your teeth with toothpaste and water, you may rinse your mouth with mouthwash if you wish. Do not swallow any toothpaste or mouthwash.  Do not wear jewelry, make-up, hairpins, clips or nail polish.  Do not wear lotions, powders, or perfumes.   Do not shave body from the neck down 48 hours prior to surgery just in case you cut yourself which could leave a site for infection.  Also, freshly shaved skin may become irritated if using the CHG soap.  Contact lenses, hearing aids and dentures may not be worn into surgery.  Do not bring valuables to the hospital. [facility] is not responsible for any missing/lost belongings or valuables.   Use CHG Soap as directed on instruction sheet.  Notify your doctor if there is any change in your medical condition (cold, fever, infection).  Wear comfortable clothing (specific to your surgery type) to the hospital.  After surgery, you can help prevent lung complications by doing breathing exercises.  Take deep breaths and cough every 1-2 hours. Your doctor may order a device called an Incentive Spirometer to help you take deep breaths. When coughing or sneezing, hold a pillow firmly against your incision with both hands. This is called "splinting." Doing this helps protect your incision. It also decreases belly discomfort.  If you are being admitted to the hospital overnight, leave your suitcase in the car. After surgery it may be brought to your  room.  If you are being discharged the day of surgery, you will not be allowed to drive home. You will need a responsible adult (18 years or older) to drive you home and stay with you that night.   If you are taking public transportation, you will need to have a responsible adult (18 years or older) with you. Please confirm with your physician that it is acceptable to use public transportation.   Please call the  Pre-admissions Testing Dept. at [phone] if you have any questions about these instructions.  Surgery Visitation Policy:  Patients undergoing a surgery or procedure may have one family member or support person with them as long as that person is not COVID-19 positive or experiencing its symptoms.  That person may remain in the waiting area during the procedure.  Inpatient Visitation:    Visiting hours are 7 a.m. to 8 p.m. Inpatients will be allowed two visitors daily. The visitors may change each day during the patient's stay. No visitors under the age of 43. Any visitor under the age of 65 must be accompanied by an adult. The visitor must pass COVID-19 screenings, use hand sanitizer when entering and exiting the patient's room and wear a mask at all times, including in the patient's room. Patients must also wear a mask when staff or their visitor are in the room. Masking is required regardless of vaccination status.

## 2021-01-09 MED ORDER — CEFAZOLIN IN SODIUM CHLORIDE 3-0.9 GM/100ML-% IV SOLN
3.0000 g | INTRAVENOUS | Status: DC
  Filled 2021-01-09 (×3): qty 100

## 2021-01-10 ENCOUNTER — Ambulatory Visit: Payer: Medicare Other | Admitting: Anesthesiology

## 2021-01-10 ENCOUNTER — Ambulatory Visit
Admission: RE | Admit: 2021-01-10 | Discharge: 2021-01-10 | Disposition: A | Discharge status: Home / Self Care | Payer: Medicare Other | Attending: Orthopedic Surgery | Admitting: Orthopedic Surgery

## 2021-01-10 ENCOUNTER — Encounter: Payer: Self-pay | Admitting: Orthopedic Surgery

## 2021-01-10 ENCOUNTER — Other Ambulatory Visit: Payer: Self-pay

## 2021-01-10 ENCOUNTER — Encounter
Admission: RE | Disposition: A | Discharge status: Home / Self Care | Payer: Self-pay | Source: Home / Self Care | Attending: Orthopedic Surgery

## 2021-01-10 DIAGNOSIS — Z8249 Family history of ischemic heart disease and other diseases of the circulatory system: Secondary | ICD-10-CM | POA: Diagnosis not present

## 2021-01-10 DIAGNOSIS — M1711 Unilateral primary osteoarthritis, right knee: Secondary | ICD-10-CM | POA: Diagnosis present

## 2021-01-10 DIAGNOSIS — K760 Fatty (change of) liver, not elsewhere classified: Secondary | ICD-10-CM | POA: Diagnosis not present

## 2021-01-10 DIAGNOSIS — I10 Essential (primary) hypertension: Secondary | ICD-10-CM | POA: Diagnosis not present

## 2021-01-10 DIAGNOSIS — F17290 Nicotine dependence, other tobacco product, uncomplicated: Secondary | ICD-10-CM | POA: Diagnosis not present

## 2021-01-10 DIAGNOSIS — Z882 Allergy status to sulfonamides status: Secondary | ICD-10-CM | POA: Insufficient documentation

## 2021-01-10 DIAGNOSIS — Z880 Allergy status to penicillin: Secondary | ICD-10-CM | POA: Insufficient documentation

## 2021-01-10 DIAGNOSIS — Z79899 Other long term (current) drug therapy: Secondary | ICD-10-CM | POA: Diagnosis not present

## 2021-01-10 DIAGNOSIS — R569 Unspecified convulsions: Secondary | ICD-10-CM | POA: Insufficient documentation

## 2021-01-10 DIAGNOSIS — Z888 Allergy status to other drugs, medicaments and biological substances status: Secondary | ICD-10-CM | POA: Diagnosis not present

## 2021-01-10 DIAGNOSIS — E785 Hyperlipidemia, unspecified: Secondary | ICD-10-CM | POA: Insufficient documentation

## 2021-01-10 HISTORY — PX: KNEE ARTHROSCOPY: SHX127

## 2021-01-10 LAB — SYNOVIAL FLUID, CRYSTAL: Crystals, Fluid: NONE SEEN

## 2021-01-10 LAB — BODY FLUID CELL COUNT WITH DIFFERENTIAL
Eos, Fluid: 2 %
Lymphs, Fluid: 32 %
Monocyte-Macrophage-Serous Fluid: 2 %
Neutrophil Count, Fluid: 64 %
Total Nucleated Cell Count, Fluid: 10279 cu mm

## 2021-01-10 LAB — POCT PREGNANCY, URINE: Preg Test, Ur: NEGATIVE

## 2021-01-10 SURGERY — ARTHROSCOPY, KNEE
Anesthesia: General | Site: Knee | Laterality: Right

## 2021-01-10 MED ORDER — CHLORHEXIDINE GLUCONATE 0.12 % MT SOLN: 15.0000 mL | Freq: Once | OROMUCOSAL | Status: AC

## 2021-01-10 MED ORDER — HYDROCODONE-ACETAMINOPHEN 5-325 MG PO TABS: 1.0000 | ORAL_TABLET | ORAL | 0 refills | Status: DC | PRN

## 2021-01-10 MED ORDER — FENTANYL CITRATE (PF) 100 MCG/2ML IJ SOLN
INTRAMUSCULAR | Status: AC
  Filled 2021-01-10: qty 2

## 2021-01-10 MED ORDER — FAMOTIDINE 20 MG PO TABS: 20.0000 mg | ORAL_TABLET | Freq: Once | ORAL | Status: AC

## 2021-01-10 MED ORDER — LIDOCAINE HCL 1 % IJ SOLN
INTRAMUSCULAR | Status: DC | PRN
  Administered 2021-01-10: 20 mL

## 2021-01-10 MED ORDER — ONDANSETRON HCL 4 MG/2ML IJ SOLN
INTRAMUSCULAR | Status: DC | PRN
  Administered 2021-01-10: 4 mg via INTRAVENOUS

## 2021-01-10 MED ORDER — ONDANSETRON HCL 4 MG PO TABS: 4.0000 mg | ORAL_TABLET | Freq: Three times a day (TID) | ORAL | 0 refills | Status: AC | PRN

## 2021-01-10 MED ORDER — LIDOCAINE HCL (CARDIAC) PF 100 MG/5ML IV SOSY
PREFILLED_SYRINGE | INTRAVENOUS | Status: DC | PRN
  Administered 2021-01-10: 50 mg via INTRAVENOUS

## 2021-01-10 MED ORDER — ASPIRIN EC 325 MG PO TBEC: 325.0000 mg | DELAYED_RELEASE_TABLET | Freq: Every day | ORAL | 0 refills | Status: AC

## 2021-01-10 MED ORDER — ACETAMINOPHEN 500 MG PO TABS
ORAL_TABLET | ORAL | Status: AC
  Administered 2021-01-10: 1000 mg via ORAL
  Filled 2021-01-10: qty 2

## 2021-01-10 MED ORDER — PROPOFOL 10 MG/ML IV BOLUS
INTRAVENOUS | Status: DC | PRN
  Administered 2021-01-10: 200 mg via INTRAVENOUS

## 2021-01-10 MED ORDER — LIDOCAINE HCL (PF) 1 % IJ SOLN
INTRAMUSCULAR | Status: AC
  Filled 2021-01-10: qty 30

## 2021-01-10 MED ORDER — CHLORHEXIDINE GLUCONATE 0.12 % MT SOLN
OROMUCOSAL | Status: AC
  Administered 2021-01-10: 15 mL via OROMUCOSAL
  Filled 2021-01-10: qty 15

## 2021-01-10 MED ORDER — ONDANSETRON HCL 4 MG/2ML IJ SOLN: 4.0000 mg | Freq: Once | INTRAMUSCULAR | Status: DC | PRN

## 2021-01-10 MED ORDER — FENTANYL CITRATE (PF) 100 MCG/2ML IJ SOLN
INTRAMUSCULAR | Status: DC | PRN
  Administered 2021-01-10: 25 ug via INTRAVENOUS
  Administered 2021-01-10: 50 ug via INTRAVENOUS
  Administered 2021-01-10 (×2): 25 ug via INTRAVENOUS

## 2021-01-10 MED ORDER — MIDAZOLAM HCL 2 MG/2ML IJ SOLN
INTRAMUSCULAR | Status: AC
  Filled 2021-01-10: qty 2

## 2021-01-10 MED ORDER — LACTATED RINGERS IV SOLN: INTRAVENOUS | Status: DC

## 2021-01-10 MED ORDER — FENTANYL CITRATE (PF) 100 MCG/2ML IJ SOLN: 25.0000 ug | INTRAMUSCULAR | Status: DC | PRN

## 2021-01-10 MED ORDER — OXYCODONE HCL 5 MG PO TABS: 5.0000 mg | ORAL_TABLET | Freq: Once | ORAL | Status: DC | PRN

## 2021-01-10 MED ORDER — BUPIVACAINE-EPINEPHRINE (PF) 0.25% -1:200000 IJ SOLN
INTRAMUSCULAR | Status: AC
  Filled 2021-01-10: qty 30

## 2021-01-10 MED ORDER — CHLORHEXIDINE GLUCONATE CLOTH 2 % EX PADS: 6.0000 | MEDICATED_PAD | Freq: Once | CUTANEOUS | Status: DC

## 2021-01-10 MED ORDER — ACETAMINOPHEN 10 MG/ML IV SOLN
INTRAVENOUS | Status: AC
  Filled 2021-01-10: qty 100

## 2021-01-10 MED ORDER — MIDAZOLAM HCL 2 MG/2ML IJ SOLN
INTRAMUSCULAR | Status: DC | PRN
  Administered 2021-01-10: 2 mg via INTRAVENOUS

## 2021-01-10 MED ORDER — BUPIVACAINE-EPINEPHRINE 0.25% -1:200000 IJ SOLN
INTRAMUSCULAR | Status: DC | PRN
  Administered 2021-01-10: 30 mL

## 2021-01-10 MED ORDER — FAMOTIDINE 20 MG PO TABS
ORAL_TABLET | ORAL | Status: AC
  Administered 2021-01-10: 20 mg via ORAL
  Filled 2021-01-10: qty 1

## 2021-01-10 MED ORDER — ACETAMINOPHEN 10 MG/ML IV SOLN
INTRAVENOUS | Status: DC | PRN
  Administered 2021-01-10: 1000 mg via INTRAVENOUS

## 2021-01-10 MED ORDER — CHLORHEXIDINE GLUCONATE CLOTH 2 % EX PADS
6.0000 | MEDICATED_PAD | Freq: Once | CUTANEOUS | Status: DC
Start: 2021-01-10 — End: 2021-01-10

## 2021-01-10 MED ORDER — PHENYLEPHRINE HCL (PRESSORS) 10 MG/ML IV SOLN
INTRAVENOUS | Status: DC | PRN
  Administered 2021-01-10: 100 ug via INTRAVENOUS
  Administered 2021-01-10: 50 ug via INTRAVENOUS
  Administered 2021-01-10: 100 ug via INTRAVENOUS
  Administered 2021-01-10: 50 ug via INTRAVENOUS
  Administered 2021-01-10 (×3): 100 ug via INTRAVENOUS

## 2021-01-10 MED ORDER — PROPOFOL 10 MG/ML IV BOLUS
INTRAVENOUS | Status: AC
  Filled 2021-01-10: qty 20

## 2021-01-10 MED ORDER — ORAL CARE MOUTH RINSE: 15.0000 mL | Freq: Once | OROMUCOSAL | Status: AC

## 2021-01-10 MED ORDER — ACETAMINOPHEN 500 MG PO TABS: 1000.0000 mg | ORAL_TABLET | ORAL | Status: AC

## 2021-01-10 MED ORDER — EPHEDRINE SULFATE 50 MG/ML IJ SOLN
INTRAMUSCULAR | Status: DC | PRN
  Administered 2021-01-10 (×2): 10 mg via INTRAVENOUS

## 2021-01-10 MED ORDER — LACTATED RINGERS IR SOLN
Status: DC | PRN
  Administered 2021-01-10 (×8): 3000 mL

## 2021-01-10 MED ORDER — KETOROLAC TROMETHAMINE 30 MG/ML IJ SOLN
INTRAMUSCULAR | Status: DC | PRN
  Administered 2021-01-10: 30 mg via INTRAVENOUS

## 2021-01-10 MED ORDER — OXYCODONE HCL 5 MG/5ML PO SOLN: 5.0000 mg | Freq: Once | ORAL | Status: DC | PRN

## 2021-01-10 MED ORDER — PHENYLEPHRINE HCL (PRESSORS) 10 MG/ML IV SOLN
INTRAVENOUS | Status: AC
  Filled 2021-01-10: qty 1

## 2021-01-10 SURGICAL SUPPLY — 49 items
ADAPTER IRRIG TUBE 2 SPIKE SOL (ADAPTER) ×4 IMPLANT
ADPR TBG 2 SPK PMP STRL ASCP (ADAPTER) ×2
BLADE FULL RADIUS 3.5 (BLADE) IMPLANT
BLADE SHAVER 4.5X7 STR FR (MISCELLANEOUS) ×2 IMPLANT
BUR BR 5.5 WIDE MOUTH (BURR) IMPLANT
CANISTER SUCT LVC 12 LTR MEDI- (MISCELLANEOUS) ×2 IMPLANT
CNTNR SPEC 2.5X3XGRAD LEK (MISCELLANEOUS) ×1
CONT SPEC 4OZ STER OR WHT (MISCELLANEOUS) ×1
CONT SPEC 4OZ STRL OR WHT (MISCELLANEOUS) ×1
CONTAINER SPEC 2.5X3XGRAD LEK (MISCELLANEOUS) ×1 IMPLANT
COOLER POLAR GLACIER W/PUMP (MISCELLANEOUS) ×2 IMPLANT
CUFF TOURN SGL QUICK 24 (TOURNIQUET CUFF)
CUFF TOURN SGL QUICK 34 (TOURNIQUET CUFF) ×2
CUFF TRNQT CYL 24X4X16.5-23 (TOURNIQUET CUFF) IMPLANT
CUFF TRNQT CYL 34X4.125X (TOURNIQUET CUFF) ×1 IMPLANT
DEVICE SUCT BLK HOLE OR FLOOR (MISCELLANEOUS) IMPLANT
DRAPE IMP U-DRAPE 54X76 (DRAPES) ×2 IMPLANT
DURAPREP 26ML APPLICATOR (WOUND CARE) ×6 IMPLANT
GAUZE SPONGE 4X4 12PLY STRL (GAUZE/BANDAGES/DRESSINGS) ×2 IMPLANT
GAUZE XEROFORM 1X8 LF (GAUZE/BANDAGES/DRESSINGS) ×2 IMPLANT
GLOVE SURG ORTHO LTX SZ9 (GLOVE) ×4 IMPLANT
GLOVE SURG UNDER POLY LF SZ9 (GLOVE) ×2 IMPLANT
GOWN STRL REUS TWL 2XL XL LVL4 (GOWN DISPOSABLE) ×2 IMPLANT
GOWN STRL REUS W/ TWL LRG LVL3 (GOWN DISPOSABLE) ×1 IMPLANT
GOWN STRL REUS W/TWL LRG LVL3 (GOWN DISPOSABLE) ×2
IV LACTATED RINGER IRRG 3000ML (IV SOLUTION) ×16
IV LR IRRIG 3000ML ARTHROMATIC (IV SOLUTION) ×8 IMPLANT
KIT TURNOVER KIT A (KITS) ×2 IMPLANT
MANIFOLD NEPTUNE II (INSTRUMENTS) ×2 IMPLANT
MAT ABSORB  FLUID 56X50 GRAY (MISCELLANEOUS) ×2
MAT ABSORB FLUID 56X50 GRAY (MISCELLANEOUS) ×2 IMPLANT
NEEDLE HYPO 22GX1.5 SAFETY (NEEDLE) ×2 IMPLANT
PACK ARTHROSCOPY KNEE (MISCELLANEOUS) ×2 IMPLANT
PAD ABD DERMACEA PRESS 5X9 (GAUZE/BANDAGES/DRESSINGS) ×4 IMPLANT
PAD WRAPON POLAR KNEE (MISCELLANEOUS) ×1 IMPLANT
SHAVER BLADE BONE CUTTER  5.5 (BLADE)
SHAVER BLADE BONE CUTTER 5.5 (BLADE) IMPLANT
SHAVER BLADE TAPERED BLUNT 4 (BLADE) ×2 IMPLANT
SOL PREP PVP 2OZ (MISCELLANEOUS)
SOLUTION PREP PVP 2OZ (MISCELLANEOUS) IMPLANT
SPONGE T-LAP 18X18 ~~LOC~~+RFID (SPONGE) ×2 IMPLANT
STRIP CLOSURE SKIN 1/2X4 (GAUZE/BANDAGES/DRESSINGS) ×2 IMPLANT
SUT ETHILON 4-0 (SUTURE) ×2
SUT ETHILON 4-0 FS2 18XMFL BLK (SUTURE) ×1
SUTURE ETHLN 4-0 FS2 18XMF BLK (SUTURE) ×1 IMPLANT
TUBING INFLOW SET DBFLO PUMP (TUBING) ×2 IMPLANT
TUBING OUTFLOW SET DBLFO PUMP (TUBING) ×2 IMPLANT
WAND WEREWOLF FLOW 90D (MISCELLANEOUS) ×2 IMPLANT
WRAPON POLAR PAD KNEE (MISCELLANEOUS) ×2

## 2021-01-10 NOTE — Anesthesia Preprocedure Evaluation (Signed)
Anesthesia Evaluation  Patient identified by MRN, date of birth, ID band Patient awake    Reviewed: Allergy & Precautions, H&P , NPO status , Patient's Chart, lab work & pertinent test results, reviewed documented beta blocker date and time   History of Anesthesia Complications (+) history of anesthetic complications  Airway Mallampati: II  TM Distance: >3 FB Neck ROM: full    Dental  (+) Teeth Intact, Loose,    Pulmonary asthma , neg sleep apnea, neg COPD, Current Smoker and Patient abstained from smoking.,    Pulmonary exam normal breath sounds clear to auscultation       Cardiovascular Exercise Tolerance: Good METShypertension, On Medications (-) CAD and (-) Past MI Normal cardiovascular exam(-) dysrhythmias  Rhythm:regular Rate:Normal     Neuro/Psych Seizures -,  PSYCHIATRIC DISORDERS Anxiety Bipolar Disorder  Neuromuscular disease    GI/Hepatic Neg liver ROS, hiatal hernia, GERD  Medicated,  Endo/Other  negative endocrine ROSneg diabetes  Renal/GU negative Renal ROS  negative genitourinary   Musculoskeletal   Abdominal   Peds  Hematology negative hematology ROS (+)   Anesthesia Other Findings Past Medical History: No date: Asthma No date: Bipolar 1 disorder (HCC) No date: Complication of anesthesia     Comment:  PREFERS NOT TO HAVE GAS IF POSSIBLE/STATES SHE SMELLS               AND TASTE IT FOR DAYS No date: Fatty liver No date: GERD (gastroesophageal reflux disease) No date: Hyperlipemia No date: Hypertension 2017: Seizures (HCC)     Comment:  LAST 2017 Past Surgical History: No date: CYST REMOVAL TRUNK 10/17/2016: ESOPHAGOGASTRODUODENOSCOPY (EGD) WITH PROPOFOL; N/A     Comment:  Procedure: ESOPHAGOGASTRODUODENOSCOPY (EGD) WITH               PROPOFOL;  Surgeon: Kieth Brightly, MD;                Location: [facility] ENDOSCOPY;  Service: Endoscopy;                Laterality: N/A; 02/19/2016:  VENTRAL HERNIA REPAIR     Comment:  Dr Evette Cristal 02/19/2016: VENTRAL HERNIA REPAIR; N/A     Comment:  Procedure: incarcerated ventral hernia repair;  Surgeon:              Kieth Brightly, MD;  Location: [facility] ORS;  Service:              General;  Laterality: N/A; 12/07/2016: VENTRAL HERNIA REPAIR; N/A     Comment:  Procedure: LAPAROSCOPIC VENTRAL HERNIA WITH MESH;                Surgeon: Kieth Brightly, MD;  Location: [facility] ORS;              Service: General;  Laterality: N/A; BMI    Body Mass Index: 38.35 kg/m     Reproductive/Obstetrics negative OB ROS                             Anesthesia Physical  Anesthesia Plan  ASA: 2  Anesthesia Plan: General   Post-op Pain Management:    Induction: Intravenous  PONV Risk Score and Plan: 3 and Ondansetron, Dexamethasone and Midazolam  Airway Management Planned: LMA  Additional Equipment: None  Intra-op Plan:   Post-operative Plan: Extubation in OR  Informed Consent: I have reviewed the patients History and Physical, chart, labs and discussed the procedure including the risks,  benefits and alternatives for the proposed anesthesia with the patient or authorized representative who has indicated his/her understanding and acceptance.     Dental Advisory Given  Plan Discussed with: CRNA  Anesthesia Plan Comments: (Discussed risks of anesthesia with patient, including PONV, sore throat, lip/dental damage. Rare risks discussed as well, such as cardiorespiratory and neurological sequelae, and allergic reactions. Patient understands.)        Anesthesia Quick Evaluation

## 2021-01-10 NOTE — Transfer of Care (Signed)
Immediate Anesthesia Transfer of Care Note  Patient: Kristina Schultz  Procedure(s) Performed: RIGHT KNEE ARTHROSCOPY SYNOVECTOMY AND CHONDROPLASTY (Right: Knee)  Patient Location: PACU  Anesthesia Type:General  Level of Consciousness: drowsy  Airway & Oxygen Therapy: Patient Spontanous Breathing and Patient connected to face mask oxygen  Post-op Assessment: Report given to RN and Post -op Vital signs reviewed and stable  Post vital signs: Reviewed and stable  Last Vitals:  Vitals Value Taken Time  BP 150/91 01/10/21 0939  Temp    Pulse 88 01/10/21 0943  Resp 11 01/10/21 0943  SpO2 99 % 01/10/21 0943  Vitals shown include unvalidated device data.  Last Pain:  Vitals:   01/10/21 0642  TempSrc: Oral  PainSc: 0-No pain         Complications: No notable events documented.

## 2021-01-10 NOTE — Anesthesia Procedure Notes (Signed)
Procedure Name: LMA Insertion Date/Time: 01/10/2021 8:18 AM Performed by: Cheral Bay, CRNA Pre-anesthesia Checklist: Patient identified, Emergency Drugs available, Suction available and Patient being monitored Patient Re-evaluated:Patient Re-evaluated prior to induction Oxygen Delivery Method: Circle system utilized Preoxygenation: Pre-oxygenation with 100% oxygen Induction Type: IV induction Ventilation: Mask ventilation without difficulty LMA: LMA inserted LMA Size: 4.0 Tube type: Oral Number of attempts: 1 Placement Confirmation: positive ETCO2 and breath sounds checked- equal and bilateral Tube secured with: Tape Dental Injury: Teeth and Oropharynx as per pre-operative assessment

## 2021-01-10 NOTE — Op Note (Addendum)
PATIENT:  Kristina Schultz  PRE-OPERATIVE DIAGNOSIS: Right knee synovitis and osteoarthritis  POST-OPERATIVE DIAGNOSIS:  same  PROCEDURE:  Right knee arthroscopic synovectomy, chondroplasty of the medial and lateral femoral condyles, lateral tibial plateau, undersurface of the patella and trochlea  SURGEON:  Thornton Park, MD  ANESTHESIA:   General  SPECIMEN: Right knee synovial fluid for culture, gram stain, cell count and crystals  PREOPERATIVE INDICATIONS:  Kristina Schultz  50 y.o. female with a diagnosis of chronic right knee effusion with synovitis and underlying osteoarthritis who failed conservative management including physical therapy and intra-articular injections and elected for surgical management.    The risks benefits and alternatives were discussed with the patient preoperatively including the risks of infection, bleeding, nerve injury, knee stiffness, persistent pain, osteoarthritis and the need for further surgery. Medical  risks include DVT and pulmonary embolism, myocardial infarction, stroke, pneumonia, respiratory failure and death. The patient understood these risks and wished to proceed.  OPERATIVE FINDINGS: Patient had significant diffuse synovitis throughout the right knee.  Patient had grade 4 chondral changes on the undersurface of her patella, grade 2/3 chondral changes of the trochlea, grade3/4 changes in the weightbearing surface of the medial femoral condyle and grade2/3 changes in the lateral tibial plateau, grade 4 focal chondral lesion of lateral femoral condyle  OPERATIVE PROCEDURE: Patient was met in the preoperative area. The operative extremity was signed with the word yes and my initials according the hospital's correct site of surgery protocol.  The patient was brought to the operating room where they was placed supine on the operative table. General anesthesia was administered. The patient was prepped and draped in a sterile fashion.  A timeout was  performed to verify the patient's name, date of birth, medical record number, correct site of surgery correct procedure to be performed. It was also used to verify the patient received antibiotics that all appropriate instruments, and radiographic studies were available in the room. Once all in attendance were in agreement, the case began.  Proposed arthroscopy incisions were drawn out with a surgical marker. These were pre-injected with 1% lidocaine plain. An 11 blade was used to establish an inferior lateral and inferomedial portals. The inferomedial portal was created using a 18-gauge spinal needle under direct visualization.  A full diagnostic examination of the knee was performed including the suprapatellar pouch, patellofemoral joint, medial lateral compartments as well as the medial lateral gutters, the intercondylar notch in the posterior knee.   Findings on arthroscopy are listed above.  Patient was found to have a partial tear of the tibial insertion of her ACL with scarring at the insertion site.  Patient clinically did not have instability on exam to Lachman's test or anterior drawer testing.  Patient had an extensive synovectomy performed including the anterior knee, medial and lateral gutters and the suprapatellar pouch.  A chondroplasty was performed of the medial femoral condyle, the lateral medial femoral condyle and tibial plateau, trochlea and undersurface of the patella using a bullet shaver blade and a 4.5 shaver.  Osteophytes of the medial femoral condyle, the lateral femoral condyle and superior most aspect of the trochlea were debrided using a 4.0 mm shaver blade.  The knee was then copiously lavaged. All arthroscopic instruments were removed. The two arthroscopy portals were closed with 4-0 nylon. Steri-Strips were applied along with a dry sterile and compressive dressing.  0.25% Marcaine with epinephrine was injected intra-articularly to assist with postoperative pain control. The  patient was brought to the PACU in  stable condition. I was scrubbed and present for the entire case and all sharp, sponge and instrument counts were correct at the conclusion the case. I spoke with the patient's family postoperatively to let them know the case was performed without complication and the patient was stable in the recovery room.   Timoteo Gaul, MD

## 2021-01-10 NOTE — H&P (Signed)
PREOPERATIVE H&P  Chief Complaint: Right Knee Synovitis  HPI: Kristina Schultz is a 50 y.o. female who presents for preoperative history and physical with a diagnosis of Right Knee Synovitis. Symptoms of pain, swelling and limited ROM are significantly impairing activities of daily living.  The patient has failed nonoperative management including months of therapy as well as previous injections and wished to proceed with arthroscopic evaluation of her right knee with partial synovectomy and possible chondroplasty.   Past Medical History:  Diagnosis Date   Anxiety    Arthritis    Asthma    Bipolar 1 disorder (HCC)    Complication of anesthesia    PREFERS NOT TO HAVE GAS IF POSSIBLE/STATES SHE SMELLS AND TASTE IT FOR DAYS   Fatty liver    GERD (gastroesophageal reflux disease)    Hyperlipemia    Hypertension    Seizures (HCC)    last seizure 12-31-20   Past Surgical History:  Procedure Laterality Date   CYST REMOVAL TRUNK     ESOPHAGOGASTRODUODENOSCOPY (EGD) WITH PROPOFOL N/A 10/17/2016   Procedure: ESOPHAGOGASTRODUODENOSCOPY (EGD) WITH PROPOFOL;  Surgeon: Kieth Brightly, MD;  Location: [facility];  Service: Endoscopy;  Laterality: N/A;   LAPAROSCOPIC APPENDECTOMY N/A 08/24/2020   Procedure: APPENDECTOMY LAPAROSCOPIC;  Surgeon: Henrene Dodge, MD;  Location: [facility];  Service: General;  Laterality: N/A;   VENTRAL HERNIA REPAIR  02/19/2016   Dr Kizzie Bane HERNIA REPAIR N/A 02/19/2016   Procedure: incarcerated ventral hernia repair;  Surgeon: Kieth Brightly, MD;  Location: [facility] OR[facility];  Service: General;  Laterality: N/A;   VENTRAL HERNIA REPAIR N/A 12/07/2016   Procedure: LAPAROSCOPIC VENTRAL HERNIA WITH MESH;  Surgeon: Kieth Brightly, MD;  Location: [facility];  Service: General;  Laterality: N/A;   Social History   Socioeconomic History   Marital status: Single    Spouse name: Not on file   Number of children: Not on file   Years of education: Not on file    Highest education level: Not on file  Occupational History   Not on file  Tobacco Use   Smoking status: Every Day    Types: Cigars   Smokeless tobacco: Never   Tobacco comments:    2 cigars per day  Vaping Use   Vaping Use: Former  Substance and Sexual Activity   Alcohol use: No   Drug use: No   Sexual activity: Not on file  Other Topics Concern   Not on file  Social History Narrative   Not on file   Social Determinants of Health   Financial Resource Strain: Not on file  Food Insecurity: Not on file  Transportation Needs: Not on file  Physical Activity: Not on file  Stress: Not on file  Social Connections: Not on file   Family History  Problem Relation Age of Onset   Transient ischemic attack Father    Breast cancer Sister 18   Allergies  Allergen Reactions   Atenolol Anaphylaxis and Swelling    SWELLING, BREATHING ISSUES   Pancreatin Nausea Only    Other reaction(s): Respiratory Distress Headache   Lamictal [Lamotrigine] Rash   Lisinopril Other (See Comments)    Pancreatitis    Losartan     Leg pain   Celexa [Citalopram Hydrobromide] Rash    Nosebleed/rash   Penicillins     Has patient had a PCN reaction causing immediate rash, facial/tongue/throat swelling, SOB or lightheadedness with hypotension: No Has patient had a PCN reaction causing severe rash  involving mucus membranes or skin necrosis: Yes Has patient had a PCN reaction that required hospitalization: No Has patient had a PCN reaction occurring within the last 10 years: No If all of the above answers are "NO", then may proceed with Cephalosporin use GAVE PATIENT A UTI/yeast infection    Prednisone Other (See Comments)    Dizzy and nausea. STOMACH CRAMPS   Sulfa Antibiotics Rash    blisters   Prior to Admission medications   Medication Sig Start Date End Date Taking? Authorizing Provider  acetaminophen (TYLENOL) 500 MG tablet Take 500 mg by mouth every 6 (six) hours as needed for pain.   Yes  [provider]  albuterol (PROVENTIL HFA;VENTOLIN HFA) 108 (90 BASE) MCG/ACT inhaler Inhale 2 puffs into the lungs every 6 (six) hours as needed for wheezing or shortness of breath. 03/06/15  Yes Jene Every, MD  alum & mag hydroxide-simeth (MAALOX PLUS) 400-400-40 MG/5ML suspension Take 5 mLs by mouth every 6 (six) hours as needed for indigestion. 10/28/19  Yes [provider]  DULoxetine (CYMBALTA) 30 MG capsule Take 30 mg by mouth at bedtime. 08/15/20  Yes [provider]  levETIRAcetam (KEPPRA) 750 MG tablet Take 375 mg by mouth at bedtime. 07/19/20  Yes [provider]  naproxen sodium (ALEVE) 220 MG tablet Take 220 mg by mouth.   Yes [provider]  triamcinolone ointment (KENALOG) 0.1 % Apply 1 application topically daily as needed for rash. 01/27/20  Yes [provider]  oxyCODONE (ROXICODONE) 5 MG immediate release tablet Take 1 tablet (5 mg total) by mouth every 8 (eight) hours as needed. Patient not taking: Reported on 12/27/2020 12/10/20 12/10/21  Delton Prairie, MD     Positive ROS: All other systems have been reviewed and were otherwise negative with the exception of those mentioned in the HPI and as above.  Physical Exam: General: Alert, no acute distress Cardiovascular: Regular rate and rhythm, no murmurs rubs or gallops.  No pedal edema Respiratory: Clear to auscultation bilaterally, no wheezes rales or rhonchi. No cyanosis, no use of accessory musculature GI: No organomegaly, abdomen is soft and non-tender nondistended with positive bowel sounds. Skin: Skin intact, no lesions within the operative field. Neurologic: Sensation intact distally Psychiatric: Patient is competent for consent with normal mood and affect Lymphatic: No cervical lymphadenopathy  MUSCULOSKELETAL: Right knee: Patient skin is intact.  Her range of motion is from 0 to 120 degrees.  She has a small effusion but no erythema or ecchymosis.  She has no calf  tenderness or lower leg edema.  She is palpable pedal pulses, intact station light touch and intact motor function distally.  She has no ligamentous laxity.  Patient had medial joint line tenderness but equivocal McMurray's test.  Assessment: Right Knee Synovitis  Plan: Plan for Procedure(s): RIGHT KNEE ARTHROSCOPY SYNOVECTOMY AND CHONDROPLASTY  I reviewed the details of the operation as well as the postoperative course with the patient.  I performed a preop history and physical at the bedside this morning.  Patient's right knee was marked according to hospital's correct site of surgery protocol.  I reviewed the patient's x-rays, MRI and labs in preparation for this case.  I discussed the risks and benefits of surgery. The risks include but are not limited to infection, bleeding, nerve or blood vessel injury, joint stiffness or loss of motion, persistent pain, weakness or instability, and the need for further surgery. Medical risks include but are not limited to DVT and pulmonary embolism, myocardial infarction,  stroke, pneumonia, respiratory failure and death. Patient understood these risks and wished to proceed.     Juanell Fairly, MD   01/10/2021 7:50 AM

## 2021-01-10 NOTE — Discharge Instructions (Signed)

## 2021-01-10 NOTE — Anesthesia Postprocedure Evaluation (Signed)
Anesthesia Post Note  Patient: Kristina Schultz  Procedure(s) Performed: RIGHT KNEE ARTHROSCOPY SYNOVECTOMY AND CHONDROPLASTY (Right: Knee)  Patient location during evaluation: PACU Anesthesia Type: General Level of consciousness: awake and alert Pain management: pain level controlled Vital Signs Assessment: post-procedure vital signs reviewed and stable Respiratory status: spontaneous breathing, nonlabored ventilation, respiratory function stable and patient connected to nasal cannula oxygen Cardiovascular status: blood pressure returned to baseline and stable Postop Assessment: no apparent nausea or vomiting Anesthetic complications: no   No notable events documented.   Last Vitals:  Vitals:   01/10/21 1015 01/10/21 1032  BP: (!) 150/88 (!) 159/92  Pulse: 84 85  Resp: 17 18  Temp: (!) 35.6 C (!) 36.2 C  SpO2: 95% 98%    Last Pain:  Vitals:   01/10/21 1032  TempSrc: Temporal  PainSc: 0-No pain                 Corinda Gubler

## 2021-01-12 ENCOUNTER — Encounter: Payer: Self-pay | Admitting: Orthopedic Surgery

## 2021-01-13 LAB — BODY FLUID CULTURE W GRAM STAIN: Culture: NO GROWTH

## 2021-01-25 ENCOUNTER — Emergency Department
Admission: EM | Admit: 2021-01-25 | Discharge: 2021-01-25 | Disposition: A | Discharge status: Home / Self Care | Payer: Medicare Other | Attending: Emergency Medicine | Admitting: Emergency Medicine

## 2021-01-25 ENCOUNTER — Other Ambulatory Visit: Payer: Self-pay

## 2021-01-25 DIAGNOSIS — Z7982 Long term (current) use of aspirin: Secondary | ICD-10-CM | POA: Diagnosis not present

## 2021-01-25 DIAGNOSIS — I10 Essential (primary) hypertension: Secondary | ICD-10-CM | POA: Diagnosis not present

## 2021-01-25 DIAGNOSIS — J45909 Unspecified asthma, uncomplicated: Secondary | ICD-10-CM | POA: Insufficient documentation

## 2021-01-25 DIAGNOSIS — T7840XA Allergy, unspecified, initial encounter: Secondary | ICD-10-CM | POA: Insufficient documentation

## 2021-01-25 DIAGNOSIS — Z79899 Other long term (current) drug therapy: Secondary | ICD-10-CM | POA: Insufficient documentation

## 2021-01-25 DIAGNOSIS — F17290 Nicotine dependence, other tobacco product, uncomplicated: Secondary | ICD-10-CM | POA: Diagnosis not present

## 2021-01-25 MED ORDER — FAMOTIDINE IN NACL 20-0.9 MG/50ML-% IV SOLN
20.0000 mg | Freq: Once | INTRAVENOUS | Status: AC
  Administered 2021-01-25: 20 mg via INTRAVENOUS
  Filled 2021-01-25: qty 50

## 2021-01-25 MED ORDER — FAMOTIDINE 20 MG PO TABS: 20.0000 mg | ORAL_TABLET | Freq: Two times a day (BID) | ORAL | 0 refills | Status: AC

## 2021-01-25 MED ORDER — DIPHENHYDRAMINE HCL 50 MG/ML IJ SOLN
25.0000 mg | Freq: Once | INTRAMUSCULAR | Status: AC
  Administered 2021-01-25: 25 mg via INTRAVENOUS
  Filled 2021-01-25: qty 1

## 2021-01-25 NOTE — ED Provider Notes (Signed)
[facility] Emergency Department Provider Note  ____________________________________________   Event Date/Time   First MD Initiated Contact with Patient 01/25/21 1422     (approximate)  I have reviewed the triage vital signs and the nursing notes.   HISTORY  Chief Complaint Allergic Reaction   HPI Kristina Schultz is a 50 y.o. female who presents to the Er for evaluation of allergic reaction. Patient states she recently had knee surgery a few weeks ago and was given medications during surgery as well as post op that she is concerned could be causing allergic reaction. She has been having itchy, red bumps come up intermittently across her skin. She was prescribed norco, but states she discontinued this 2 days after surgery. She was also prescribed 325 mg aspirin, which she has been continuing to take, but states she called her surgeons' office today who recommended discontinuing the medication. She denies any tongue or throat swelling, chest pain, shortness of breath, abdominal pain, nausea, vomiting or diarrhea. She denies any new soaps, lotions, detergents or other possible agents. She has been treating at home intermittently with some benadryl, and notes partial relief of symptoms but not full relief.         Past Medical History:  Diagnosis Date   Anxiety    Arthritis    Asthma    Bipolar 1 disorder (HCC)    Complication of anesthesia    PREFERS NOT TO HAVE GAS IF POSSIBLE/STATES SHE SMELLS AND TASTE IT FOR DAYS   Fatty liver    GERD (gastroesophageal reflux disease)    Hyperlipemia    Hypertension    Seizures (HCC)    last seizure 12-31-20    Patient Active Problem List   Diagnosis Date Noted   Acute appendicitis 08/24/2020   Aortic atherosclerosis (HCC) 04/02/2018   Diastasis recti 04/02/2018   Diverticulosis of colon 04/02/2018   Fatty liver disease, nonalcoholic 04/02/2018   Hiatal hernia 04/02/2018   History of pancreatitis 04/02/2018    Bipolar disorder with depression (HCC) 12/14/2017   Behavior-irritability 07/12/2016   Vitamin D deficiency 07/12/2016   Incarcerated ventral hernia 02/19/2016   Seizure (HCC) 05/18/2015    Past Surgical History:  Procedure Laterality Date   CYST REMOVAL TRUNK     ESOPHAGOGASTRODUODENOSCOPY (EGD) WITH PROPOFOL N/A 10/17/2016   Procedure: ESOPHAGOGASTRODUODENOSCOPY (EGD) WITH PROPOFOL;  Surgeon: Kieth Brightly, MD;  Location: [facility];  Service: Endoscopy;  Laterality: N/A;   KNEE ARTHROSCOPY Right 01/10/2021   Procedure: RIGHT KNEE ARTHROSCOPY SYNOVECTOMY AND CHONDROPLASTY;  Surgeon: Juanell Fairly, MD;  Location: [facility];  Service: Orthopedics;  Laterality: Right;   LAPAROSCOPIC APPENDECTOMY N/A 08/24/2020   Procedure: APPENDECTOMY LAPAROSCOPIC;  Surgeon: Henrene Dodge, MD;  Location: [facility] OR[facility];  Service: General;  Laterality: N/A;   VENTRAL HERNIA REPAIR  02/19/2016   Dr Kizzie Bane HERNIA REPAIR N/A 02/19/2016   Procedure: incarcerated ventral hernia repair;  Surgeon: Kieth Brightly, MD;  Location: [facility] OR[facility];  Service: General;  Laterality: N/A;   VENTRAL HERNIA REPAIR N/A 12/07/2016   Procedure: LAPAROSCOPIC VENTRAL HERNIA WITH MESH;  Surgeon: Kieth Brightly, MD;  Location: [facility];  Service: General;  Laterality: N/A;    Prior to Admission medications   Medication Sig Start Date End Date Taking? Authorizing Provider  famotidine (PEPCID) 20 MG tablet Take 1 tablet (20 mg total) by mouth 2 (two) times daily. 01/25/21 02/24/21 Yes Lucy Chris, PA  acetaminophen (TYLENOL) 500 MG tablet Take 500 mg by  mouth every 6 (six) hours as needed for pain.    [provider]  albuterol (PROVENTIL HFA;VENTOLIN HFA) 108 (90 BASE) MCG/ACT inhaler Inhale 2 puffs into the lungs every 6 (six) hours as needed for wheezing or shortness of breath. 03/06/15   Jene Every, MD  alum & mag hydroxide-simeth (MAALOX PLUS) 400-400-40 MG/5ML suspension Take 5 mLs by  mouth every 6 (six) hours as needed for indigestion. 10/28/19   [provider]  aspirin EC 325 MG tablet Take 1 tablet (325 mg total) by mouth daily. 01/10/21   Juanell Fairly, MD  DULoxetine (CYMBALTA) 30 MG capsule Take 30 mg by mouth at bedtime. 08/15/20   [provider]  HYDROcodone-acetaminophen (NORCO) 5-325 MG tablet Take 1-2 tablets by mouth every 4 (four) hours as needed for moderate pain. 01/10/21   Juanell Fairly, MD  levETIRAcetam (KEPPRA) 750 MG tablet Take 375 mg by mouth at bedtime. 07/19/20   [provider]  naproxen sodium (ALEVE) 220 MG tablet Take 220 mg by mouth.    [provider]  ondansetron (ZOFRAN) 4 MG tablet Take 1 tablet (4 mg total) by mouth every 8 (eight) hours as needed for nausea or vomiting. 01/10/21   Juanell Fairly, MD  triamcinolone ointment (KENALOG) 0.1 % Apply 1 application topically daily as needed for rash. 01/27/20   [provider]    Allergies Atenolol, Pancreatin, Lamictal [lamotrigine], Lisinopril, Losartan, Celexa [citalopram hydrobromide], Ecotrin [aspirin], Penicillins, Prednisone, and Sulfa antibiotics  Family History  Problem Relation Age of Onset   Transient ischemic attack Father    Breast cancer Sister 24    Social History Social History   Tobacco Use   Smoking status: Every Day    Types: Cigars   Smokeless tobacco: Never   Tobacco comments:    2 cigars per day  Vaping Use   Vaping Use: Former  Substance Use Topics   Alcohol use: No   Drug use: No    Review of Systems Constitutional: No fever/chills Eyes: No visual changes. ENT: No sore throat. Cardiovascular: Denies chest pain. Respiratory: Denies shortness of breath. Gastrointestinal: No abdominal pain.  No nausea, no vomiting.  No diarrhea.  No constipation. Genitourinary: Negative for dysuria. Musculoskeletal: Negative for back pain. Skin: +rash, itching Neurological: Negative for headaches, focal weakness or  numbness.  ____________________________________________   PHYSICAL EXAM:  VITAL SIGNS: ED Triage Vitals  Enc Vitals Group     BP 01/25/21 1415 (!) 150/88     Pulse Rate 01/25/21 1415 96     Resp 01/25/21 1415 17     Temp 01/25/21 1415 98.3 F (36.8 C)     Temp Source 01/25/21 1415 Oral     SpO2 01/25/21 1415 96 %     Weight 01/25/21 1349 270 lb (122.5 kg)     Height 01/25/21 1349 5\' 11"  (1.803 m)     Head Circumference --      Peak Flow --      Pain Score 01/25/21 1349 0     Pain Loc --      Pain Edu? --      Excl. in GC? --    Constitutional: Alert and oriented. Well appearing and in no acute distress. Eyes: Conjunctivae are normal. PERRL. EOMI. Head: Atraumatic. Nose: No congestion/rhinnorhea. Mouth/Throat: Mucous membranes are moist.  Oropharynx non-erythematous. Neck: No stridor.   Cardiovascular: Normal rate, regular rhythm. Grossly normal heart sounds.  Good peripheral circulation. Respiratory: Normal respiratory effort.  No retractions. Lungs CTAB. Gastrointestinal:  Soft and nontender. No distention. No abdominal bruits. No CVA tenderness. Musculoskeletal: No lower extremity tenderness nor edema.  No joint effusions. Neurologic:  Normal speech and language. No gross focal neurologic deficits are appreciated. No gait instability. Skin:  There is diffusely spread erythematous maculopapular rash, no vesicular or pustular lesions.  Psychiatric: Mood and affect are normal. Speech and behavior are normal.    ____________________________________________   INITIAL IMPRESSION / ASSESSMENT AND PLAN / ED COURSE  As part of my medical decision making, I reviewed the following data within the electronic MEDICAL RECORD NUMBER Nursing notes reviewed and incorporated and Notes from prior ED visits        Patient is a 50 yo female who reports to the ER for evaluation of allergic reaction. On new medication aspirin 325 as well as Norco since surgery, but denies any recent use of  the norco. She denies any signs of systemic involvement including no facial or tongue swelling, SOB, abdominal pain, N/V/D. See HPI.   In triage, patient is mildly hypertensive but otherwise has normal vital signs. PE as above. Will initiate treatment for allergic reaction. Given no signs of systemic involvement, as well as patient's recent surgery, will defer steroids if we have resolution of symptoms with benadryl and pepcid to reduce possible surgical complications.  Patient was noted to have significant improvement in symptoms 30-60 minutes following administration of pepcid and benadryl. Will discharge home with close instruction on these medications. Discussed return precautions at length, patient is stable at this time for outpatient follow up.       ____________________________________________   FINAL CLINICAL IMPRESSION(S) / ED DIAGNOSES  Final diagnoses:  Allergic reaction, initial encounter     ED Discharge Orders          Ordered    famotidine (PEPCID) 20 MG tablet  2 times daily        01/25/21 1612             Note:  This document was prepared using Dragon voice recognition software and may include unintentional dictation errors.    Lucy Chris, PA 01/25/21 1943    Arnaldo Natal, MD 01/26/21 1340

## 2021-01-25 NOTE — ED Triage Notes (Signed)
Pt comes pov with allergic reaction to medication. Pt had knee surgery and was given multiple meds and has many allergies and thinks this is another allergy. Pt has rash on most of body. Denies SOB, CP. Itches.

## 2021-01-25 NOTE — Discharge Instructions (Addendum)
Please discontinue any medications that you were previously instructed to do from your orthopedic surgeon.  Please take the Pepcid, 20 mg twice daily.  Please also take Benadryl, 25 mg 3 times daily.  Please use caution when driving or operating machinery as this medication may cause drowsiness.  Return to the emergency department if you experience any worsening of symptoms, particularly if you have any shortness of breath, chest pain, tongue swelling or any other changes.

## 2021-02-27 ENCOUNTER — Other Ambulatory Visit: Payer: Self-pay | Admitting: Family Medicine

## 2021-02-27 DIAGNOSIS — Z1231 Encounter for screening mammogram for malignant neoplasm of breast: Secondary | ICD-10-CM

## 2021-03-21 ENCOUNTER — Ambulatory Visit
Admission: EM | Admit: 2021-03-21 | Discharge: 2021-03-21 | Discharge status: Against Medical Advice | Payer: Medicare Other

## 2021-03-21 ENCOUNTER — Other Ambulatory Visit: Payer: Self-pay

## 2021-03-21 NOTE — ED Notes (Signed)
Pt brought in to triage room for assessment. Once current wait time was explained to patient, she decided not to wait to be seen. She stated "I have my 49 year old grandmother in the car". This RN offered for pt to return tomorrow morning when we reopen and she said she would consider. Pt discharged as LWBS.

## 2021-08-30 ENCOUNTER — Emergency Department
Admission: EM | Admit: 2021-08-30 | Discharge: 2021-08-30 | Disposition: A | Discharge status: Home / Self Care | Payer: Medicare Other | Attending: Student in an Organized Health Care Education/Training Program | Admitting: Student in an Organized Health Care Education/Training Program

## 2021-08-30 ENCOUNTER — Encounter: Payer: Self-pay | Admitting: Emergency Medicine

## 2021-08-30 ENCOUNTER — Other Ambulatory Visit: Payer: Self-pay

## 2021-08-30 DIAGNOSIS — L02412 Cutaneous abscess of left axilla: Secondary | ICD-10-CM | POA: Insufficient documentation

## 2021-08-30 DIAGNOSIS — J45909 Unspecified asthma, uncomplicated: Secondary | ICD-10-CM | POA: Insufficient documentation

## 2021-08-30 DIAGNOSIS — I10 Essential (primary) hypertension: Secondary | ICD-10-CM | POA: Insufficient documentation

## 2021-08-30 MED ORDER — LIDOCAINE HCL (PF) 1 % IJ SOLN
5.0000 mL | Freq: Once | INTRAMUSCULAR | Status: AC
  Administered 2021-08-30: 5 mL
  Filled 2021-08-30: qty 5

## 2021-08-30 MED ORDER — HYDROCODONE-ACETAMINOPHEN 5-325 MG PO TABS: 1.0000 | ORAL_TABLET | Freq: Four times a day (QID) | ORAL | 0 refills | Status: AC | PRN

## 2021-08-30 MED ORDER — DOXYCYCLINE HYCLATE 100 MG PO CAPS: 100.0000 mg | ORAL_CAPSULE | Freq: Two times a day (BID) | ORAL | 0 refills | Status: AC

## 2021-08-30 MED ORDER — LIDOCAINE-PRILOCAINE 2.5-2.5 % EX CREA
TOPICAL_CREAM | Freq: Once | CUTANEOUS | Status: AC
  Filled 2021-08-30: qty 5

## 2021-08-30 NOTE — Discharge Instructions (Signed)
With your primary care provider if any continued problems.  Begin taking antibiotics as directed until finished.  You may remove the packing from your arm in 2 days.  Pain medication was also sent to the pharmacy.  Be aware that this may make you drowsy and do not drive while taking this medication.  You may also use warm moist compresses to the area to increase drainage if needed. ?

## 2021-08-30 NOTE — ED Provider Notes (Signed)
? ?[facility] ?Provider Note ? ? ? Event Date/Time  ? First MD Initiated Contact with Patient 08/30/21 1355   ?  (approximate) ? ? ?History  ? ?Abscess ? ? ?HPI ? ?Kristina Schultz is a 51 y.o. female   presents to the ED with an abscess to her left axilla.  Patient has had these in the past and is very familiar with the process.  Past medical history includes anxiety, asthma, hypertension, seizures and bipolar 1 disorder. ? ?  ? ? ?Physical Exam  ? ?Triage Vital Signs: ?ED Triage Vitals  ?Enc Vitals Group  ?   BP 08/30/21 1345 (!) 164/98  ?   Pulse Rate 08/30/21 1345 89  ?   Resp 08/30/21 1345 16  ?   Temp 08/30/21 1345 98.5 ?F (36.9 ?C)  ?   Temp Source 08/30/21 1345 Oral  ?   SpO2 08/30/21 1345 96 %  ?   Weight 08/30/21 1327 270 lb 1 oz (122.5 kg)  ?   Height 08/30/21 1327 5\' 11"  (1.803 m)  ?   Head Circumference --   ?   Peak Flow --   ?   Pain Score 08/30/21 1327 0  ?   Pain Loc --   ?   Pain Edu? --   ?   Excl. in GC? --   ? ? ?Most recent vital signs: ?Vitals:  ? 08/30/21 1345  ?BP: (!) 164/98  ?Pulse: 89  ?Resp: 16  ?Temp: 98.5 ?F (36.9 ?C)  ?SpO2: 96%  ? ? ? ?General: Awake, no distress.  ?CV:  Good peripheral perfusion.  ?Resp:  Normal effort.  ?Abd:  No distention.  ?Other:  Left axilla with abscess present.  Mild erythema and no active drainage at this time.  Area is tender to palpation. ? ? ?ED Results / Procedures / Treatments  ? ?Labs ?(all labs ordered are listed, but only abnormal results are displayed) ?Labs Reviewed - No data to display ? ? ? ? ?PROCEDURES: ? ?Critical Care performed:  ? ?10/30/21.Incision and Drainage ? ?Date/Time: 08/30/2021 2:31 PM ?Performed by: 10/30/2021, PA-C ?Authorized by: Tommi Rumps, PA-C  ? ?Consent:  ?  Consent obtained:  Verbal ?  Consent given by:  Patient ?  Risks discussed:  Pain ?Location:  ?  Type:  Abscess ?  Location:  Upper extremity ?Anesthesia:  ?  Anesthesia method:  Topical application ?  Topical anesthetic:  EMLA cream ?Procedure  type:  ?  Complexity:  Simple ?Procedure details:  ?  Incision types:  Stab incision ?  Incision depth:  Dermal ?  Drainage:  Purulent ?  Drainage amount:  Moderate ?  Wound treatment:  Drain placed ?  Packing materials:  1/4 in iodoform gauze ?Post-procedure details:  ?  Procedure completion:  Tolerated well, no immediate complications ? ? ?MEDICATIONS ORDERED IN ED: ?Medications  ?lidocaine-prilocaine (EMLA) cream ( Topical Given 08/30/21 1428)  ?lidocaine (PF) (XYLOCAINE) 1 % injection 5 mL (5 mLs Infiltration Given 08/30/21 1428)  ? ? ? ?IMPRESSION / MDM / ASSESSMENT AND PLAN / ED COURSE  ?I reviewed the triage vital signs and the nursing notes. ? ? ?Differential diagnosis includes, but is not limited to, abscess left axilla. ? ?51 year old female presents to the ED with abscess in her left axilla.  Patient is very familiar with this as she has had done before.  She tolerated procedure extremely well.  Patient states that she remove the packing herself  last time and has no difficulties with removing it at this time as well.  A prescription for doxycycline 100 mg twice daily and Norco was sent to the pharmacy.  She is encouraged to use warm compresses as needed.  Also if any worsening, fever or chills she is return to the emergency department. ? ? ? ? ? ? ?  ? ? ?FINAL CLINICAL IMPRESSION(S) / ED DIAGNOSES  ? ?Final diagnoses:  ?Abscess of axilla, left  ? ? ? ?Rx / DC Orders  ? ?ED Discharge Orders   ? ?      Ordered  ?  doxycycline (VIBRAMYCIN) 100 MG capsule  2 times daily       ? 08/30/21 1525  ?  HYDROcodone-acetaminophen (NORCO/VICODIN) 5-325 MG tablet  Every 6 hours PRN       ? 08/30/21 1525  ? ?  ?  ? ?  ? ? ? ?Note:  This document was prepared using Dragon voice recognition software and may include unintentional dictation errors. ?  ?Tommi Rumps, PA-C ?08/30/21 1554 ? ?  ?Willy Eddy, MD ?08/31/21 1309 ? ?

## 2021-08-30 NOTE — ED Triage Notes (Signed)
Pt comes into the ED via POV c/o abscess in the left axilla.  Pt has known hidradenitis.  Pt denies any pain, but states the abscess has been present for about a week.  Pt in NAD.  ?

## 2021-09-26 ENCOUNTER — Encounter: Payer: Self-pay | Admitting: Internal Medicine

## 2021-09-27 ENCOUNTER — Ambulatory Visit: Payer: Medicare Other | Admitting: Certified Registered"

## 2021-09-27 ENCOUNTER — Encounter: Payer: Self-pay | Admitting: Internal Medicine

## 2021-09-27 ENCOUNTER — Ambulatory Visit
Admission: RE | Admit: 2021-09-27 | Discharge: 2021-09-27 | Disposition: A | Discharge status: Home / Self Care | Payer: Medicare Other | Source: Ambulatory Visit | Attending: Internal Medicine | Admitting: Internal Medicine

## 2021-09-27 ENCOUNTER — Encounter
Admission: RE | Disposition: A | Discharge status: Home / Self Care | Payer: Self-pay | Source: Ambulatory Visit | Attending: Internal Medicine

## 2021-09-27 DIAGNOSIS — K449 Diaphragmatic hernia without obstruction or gangrene: Secondary | ICD-10-CM | POA: Diagnosis not present

## 2021-09-27 DIAGNOSIS — F419 Anxiety disorder, unspecified: Secondary | ICD-10-CM | POA: Diagnosis not present

## 2021-09-27 DIAGNOSIS — Z800 Family history of malignant neoplasm of digestive organs: Secondary | ICD-10-CM | POA: Diagnosis not present

## 2021-09-27 DIAGNOSIS — K5730 Diverticulosis of large intestine without perforation or abscess without bleeding: Secondary | ICD-10-CM | POA: Insufficient documentation

## 2021-09-27 DIAGNOSIS — Z1211 Encounter for screening for malignant neoplasm of colon: Secondary | ICD-10-CM | POA: Diagnosis present

## 2021-09-27 DIAGNOSIS — F319 Bipolar disorder, unspecified: Secondary | ICD-10-CM | POA: Diagnosis not present

## 2021-09-27 DIAGNOSIS — M1990 Unspecified osteoarthritis, unspecified site: Secondary | ICD-10-CM | POA: Insufficient documentation

## 2021-09-27 DIAGNOSIS — R569 Unspecified convulsions: Secondary | ICD-10-CM | POA: Diagnosis not present

## 2021-09-27 DIAGNOSIS — K640 First degree hemorrhoids: Secondary | ICD-10-CM | POA: Insufficient documentation

## 2021-09-27 HISTORY — DX: Diverticulosis of intestine, part unspecified, without perforation or abscess without bleeding: K57.90

## 2021-09-27 HISTORY — DX: Atherosclerosis of aorta: I70.0

## 2021-09-27 HISTORY — PX: COLONOSCOPY WITH PROPOFOL: SHX5780

## 2021-09-27 HISTORY — DX: Personal history of other diseases of the digestive system: Z87.19

## 2021-09-27 HISTORY — DX: Separation of muscle (nontraumatic), other site: M62.08

## 2021-09-27 HISTORY — DX: Hidradenitis suppurativa: L73.2

## 2021-09-27 HISTORY — DX: Vitamin D deficiency, unspecified: E55.9

## 2021-09-27 HISTORY — DX: Diaphragmatic hernia without obstruction or gangrene: K44.9

## 2021-09-27 LAB — POCT PREGNANCY, URINE: Preg Test, Ur: NEGATIVE

## 2021-09-27 SURGERY — COLONOSCOPY WITH PROPOFOL
Anesthesia: General

## 2021-09-27 MED ORDER — SODIUM CHLORIDE 0.9 % IV SOLN: INTRAVENOUS | Status: DC

## 2021-09-27 MED ORDER — PROPOFOL 10 MG/ML IV BOLUS
INTRAVENOUS | Status: DC | PRN
  Administered 2021-09-27: 100 mg via INTRAVENOUS

## 2021-09-27 MED ORDER — PROPOFOL 500 MG/50ML IV EMUL
INTRAVENOUS | Status: DC | PRN
Start: 2021-09-27 — End: 2021-09-27
  Administered 2021-09-27: 150 ug/kg/min via INTRAVENOUS

## 2021-09-27 MED ORDER — LIDOCAINE HCL (CARDIAC) PF 100 MG/5ML IV SOSY
PREFILLED_SYRINGE | INTRAVENOUS | Status: DC | PRN
Start: 2021-09-27 — End: 2021-09-27
  Administered 2021-09-27: 50 mg via INTRAVENOUS

## 2021-09-27 MED ORDER — GLYCOPYRROLATE 0.2 MG/ML IJ SOLN
INTRAMUSCULAR | Status: DC | PRN
  Administered 2021-09-27: .2 mg via INTRAVENOUS

## 2021-09-27 NOTE — Anesthesia Postprocedure Evaluation (Signed)
Anesthesia Post Note ? ?Patient: Kristina Schultz ? ?Procedure(s) Performed: COLONOSCOPY WITH PROPOFOL ? ?Patient location during evaluation: PACU ?Anesthesia Type: General ?Level of consciousness: awake ?Pain management: satisfactory to patient ?Vital Signs Assessment: vitals unstable and post-procedure vital signs reviewed and stable ?Respiratory status: spontaneous breathing and respiratory function stable ?Cardiovascular status: stable ?Anesthetic complications: no ? ? ?No notable events documented. ? ? ?Last Vitals:  ?Vitals:  ? 09/27/21 1022 09/27/21 1026  ?BP: 114/75   ?Pulse: 74 71  ?Resp: (!) 21 (!) 22  ?Temp:    ?SpO2: 97% 97%  ?  ?Last Pain:  ?Vitals:  ? 09/27/21 1009  ?TempSrc: Temporal  ?PainSc:   ? ? ?  ?  ?  ?  ?  ?  ? ?VAN STAVEREN,Kestrel Mis ? ? ? ? ?

## 2021-09-27 NOTE — Op Note (Signed)
[facility] ?Gastroenterology ?Patient Name: Kristina Schultz ?Procedure Date: 09/27/2021 9:33 AM ?MRN: [id number] ?Account #: [id number] ?Date of Birth: 1971-02-02 ?Admit Type: Outpatient ?Age: 51 ?Room: [facility] ENDO ROOM 2 ?Gender: Female ?Note Status: Finalized ?Instrument Name: Colonoscope [id number] ?Procedure:             Colonoscopy ?Indications:           Screening in patient at increased risk: Family history  ?                       of 1st-degree relative with colorectal cancer ?Providers:             Boykin Nearing. Orena Cavazos MD, MD ?Medicines:             Propofol per Anesthesia ?Complications:         No immediate complications. ?Procedure:             Pre-Anesthesia Assessment: ?                       - The risks and benefits of the procedure and the  ?                       sedation options and risks were discussed with the  ?                       patient. All questions were answered and informed  ?                       consent was obtained. ?                       - Patient identification and proposed procedure were  ?                       verified prior to the procedure by the nurse. The  ?                       procedure was verified in the procedure room. ?                       - ASA Grade Assessment: III - A patient with severe  ?                       systemic disease. ?                       - After reviewing the risks and benefits, the patient  ?                       was deemed in satisfactory condition to undergo the  ?                       procedure. ?                       After obtaining informed consent, the colonoscope was  ?                       passed under direct vision. Throughout the procedure,  ?  the patient's blood pressure, pulse, and oxygen  ?                       saturations were monitored continuously. The  ?                       Colonoscope was introduced through the anus and  ?                       advanced to the the cecum, identified by  appendiceal  ?                       orifice and ileocecal valve. The colonoscopy was  ?                       performed without difficulty. The patient tolerated  ?                       the procedure well. The quality of the bowel  ?                       preparation was adequate. The ileocecal valve,  ?                       appendiceal orifice, and rectum were photographed. ?Findings: ?     The perianal and digital rectal examinations were normal. Pertinent  ?     negatives include normal sphincter tone and no palpable rectal lesions. ?     Internal hemorrhoids were found during retroflexion. The hemorrhoids  ?     were mild and Grade I (internal hemorrhoids that do not prolapse). ?     A few medium-mouthed diverticula were found in the left colon. ?     The exam was otherwise without abnormality. ?Impression:            - Internal hemorrhoids. ?                       - Diverticulosis in the left colon. ?                       - The examination was otherwise normal. ?                       - No specimens collected. ?Recommendation:        - Patient has a contact number available for  ?                       emergencies. The signs and symptoms of potential  ?                       delayed complications were discussed with the patient.  ?                       Return to normal activities tomorrow. Written  ?                       discharge instructions were provided to the patient. ?                       -  Resume previous diet. ?                       - Continue present medications. ?                       - Repeat colonoscopy in 5 years for screening purposes. ?                       - Return to GI office PRN. ?                       - The findings and recommendations were discussed with  ?                       the patient. ?Procedure Code(s):     --- Professional --- ?                       G0105, Colorectal cancer screening; colonoscopy on  ?                       individual at high risk ?Diagnosis Code(s):      --- Professional --- ?                       K57.30, Diverticulosis of large intestine without  ?                       perforation or abscess without bleeding ?                       K64.0, First degree hemorrhoids ?                       Z80.0, Family history of malignant neoplasm of  ?                       digestive organs ?CPT copyright 2019 American Medical Association. All rights reserved. ?The codes documented in this report are preliminary and upon coder review may  ?be revised to meet current compliance requirements. ?Stanton Kidney MD, MD ?09/27/2021 10:07:21 AM ?This report has been signed electronically. ?Number of Addenda: 0 ?Note Initiated On: 09/27/2021 9:33 AM ?Scope Withdrawal Time: 0 hours 7 minutes 14 seconds  ?Total Procedure Duration: 0 hours 11 minutes 16 seconds  ?Estimated Blood Loss:  Estimated blood loss: none. ?     [facility] ?

## 2021-09-27 NOTE — Anesthesia Postprocedure Evaluation (Signed)
Anesthesia Post Note ? ?Patient: Kristina Schultz ? ?Procedure(s) Performed: COLONOSCOPY WITH PROPOFOL ? ?Patient location during evaluation: PACU ?Anesthesia Type: General ?Level of consciousness: awake and awake and alert ?Pain management: satisfactory to patient ?Vital Signs Assessment: post-procedure vital signs reviewed and stable ?Respiratory status: spontaneous breathing and respiratory function stable ?Cardiovascular status: stable ?Anesthetic complications: no ? ? ?No notable events documented. ? ? ?Last Vitals:  ?Vitals:  ? 09/27/21 1022 09/27/21 1026  ?BP: 114/75   ?Pulse: 74 71  ?Resp: (!) 21 (!) 22  ?Temp:    ?SpO2: 97% 97%  ?  ?Last Pain:  ?Vitals:  ? 09/27/21 1009  ?TempSrc: Temporal  ?PainSc:   ? ? ?  ?  ?  ?  ?  ?  ? ?VAN STAVEREN,Shaheen Star ? ? ? ? ?

## 2021-09-27 NOTE — Transfer of Care (Signed)
Immediate Anesthesia Transfer of Care Note ? ?Patient: Kristina Schultz ? ?Procedure(s) Performed: COLONOSCOPY WITH PROPOFOL ? ?Patient Location: PACU and Endoscopy Unit ? ?Anesthesia Type:General ? ?Level of Consciousness: drowsy ? ?Airway & Oxygen Therapy: Patient Spontanous Breathing ? ?Post-op Assessment: Report given to RN ? ?Post vital signs: stable ? ?Last Vitals:  ?Vitals Value Taken Time  ?BP    ?Temp    ?Pulse 77 09/27/21 1009  ?Resp 30 09/27/21 1009  ?SpO2 95 % 09/27/21 1009  ? ? ?Last Pain:  ?Vitals:  ? 09/27/21 0918  ?TempSrc: Temporal  ?PainSc: 0-No pain  ?   ? ?  ? ?Complications: No notable events documented. ?

## 2021-09-27 NOTE — Anesthesia Preprocedure Evaluation (Signed)
Anesthesia Evaluation  ?Patient identified by MRN, date of birth, ID band ?Patient awake ? ? ? ?Reviewed: ?Allergy & Precautions, NPO status , Patient's Chart, lab work & pertinent test results ? ?Airway ?Mallampati: III ? ?TM Distance: >3 FB ?Neck ROM: Full ? ? ? Dental ? ?(+) Loose, Dental Advisory Given,  ?  ?Pulmonary ?neg pulmonary ROS, asthma , Current Smoker,  ?  ?Pulmonary exam normal ? ?+ decreased breath sounds ? ? ? ? ? Cardiovascular ?Exercise Tolerance: Good ?hypertension, Pt. on medications ?negative cardio ROS ?Normal cardiovascular exam ?Rhythm:Regular  ? ?  ?Neuro/Psych ?Seizures -, Well Controlled,  Anxiety Bipolar Disorder negative neurological ROS ? negative psych ROS  ? GI/Hepatic ?negative GI ROS, Neg liver ROS, hiatal hernia,   ?Endo/Other  ?negative endocrine ROS ? Renal/GU ?negative Renal ROS  ?negative genitourinary ?  ?Musculoskeletal ? ?(+) Arthritis ,  ? Abdominal ?Normal abdominal exam  (+)   ?Peds ?negative pediatric ROS ?(+)  Hematology ?negative hematology ROS ?(+)   ?Anesthesia Other Findings ?Past Medical History: ?No date: Anxiety ?No date: Aortic atherosclerosis ([place]) ?No date: Arthritis ?No date: Asthma ?No date: Bipolar 1 disorder ([place]) ?No date: Complication of anesthesia ?    Comment:  PREFERS NOT TO HAVE GAS IF POSSIBLE/STATES SHE SMELLS  ?             AND TASTE IT FOR DAYS ?No date: Diastasis recti ?No date: Diverticulosis ?No date: Fatty liver ?No date: GERD (gastroesophageal reflux disease) ?No date: Hiatal hernia ?No date: Hidradenitis suppurativa ?No date: History of pancreatitis ?No date: Hyperlipemia ?No date: Hypertension ?No date: Seizures ([place]) ?    Comment:  last seizure 12-31-20 ?No date: Vitamin D deficiency ? ?Past Surgical History: ?No date: CYST REMOVAL TRUNK ?10/17/2016: ESOPHAGOGASTRODUODENOSCOPY (EGD) WITH PROPOFOL; N/A ?    Comment:  Procedure: ESOPHAGOGASTRODUODENOSCOPY (EGD) WITH  ?             PROPOFOL;  Surgeon: Christene Lye, MD;   ?             Location: [facility] ENDOSCOPY;  Service: Endoscopy;   ?             Laterality: N/A; ?01/10/2021: KNEE ARTHROSCOPY; Right ?    Comment:  Procedure: RIGHT KNEE ARTHROSCOPY SYNOVECTOMY AND  ?             CHONDROPLASTY;  Surgeon: Thornton Park, MD;  Location: ?             [facility] ORS;  Service: Orthopedics;  Laterality: Right; ?08/24/2020: LAPAROSCOPIC APPENDECTOMY; N/A ?    Comment:  Procedure: APPENDECTOMY LAPAROSCOPIC;  Surgeon: Hampton Abbot, ?             Jacqulyn Bath, MD;  Location: [facility] ORS;  Service: General;   ?             Laterality: N/A; ?02/19/2016: VENTRAL HERNIA REPAIR ?    Comment:  Dr Jamal Collin ?02/19/2016: VENTRAL HERNIA REPAIR; N/A ?    Comment:  Procedure: incarcerated ventral hernia repair;  Surgeon: ?             Seeplaputhur Robinette Haines, MD;  Location: [facility] ORS;  Service: ?             General;  Laterality: N/A; ?12/07/2016: VENTRAL HERNIA REPAIR; N/A ?    Comment:  Procedure: LAPAROSCOPIC VENTRAL HERNIA WITH MESH;   ?             Surgeon: Christene Lye, MD;  Location: [facility]  ORS; ?             Service: General;  Laterality: N/A; ? ?BMI   ? Body Mass Index: 38.08 kg/m?  ?  ? ? Reproductive/Obstetrics ?negative OB ROS ? ?  ? ? ? ? ? ? ? ? ? ? ? ? ? ?  ?  ? ? ? ? ? ? ? ? ?Anesthesia Physical ?Anesthesia Plan ? ?ASA: 3 ? ?Anesthesia Plan: General  ? ?Post-op Pain Management:   ? ?Induction: Intravenous ? ?PONV Risk Score and Plan: Propofol infusion and TIVA ? ?Airway Management Planned: Natural Airway and Nasal Cannula ? ?Additional Equipment:  ? ?Intra-op Plan:  ? ?Post-operative Plan:  ? ?Informed Consent: I have reviewed the patients History and Physical, chart, labs and discussed the procedure including the risks, benefits and alternatives for the proposed anesthesia with the patient or authorized representative who has indicated his/her understanding and acceptance.  ? ? ? ?Dental Advisory Given ? ?Plan Discussed with: CRNA and Surgeon ? ?Anesthesia Plan Comments:    ? ? ? ? ? ? ?Anesthesia Quick Evaluation ? ?

## 2021-09-27 NOTE — Interval H&P Note (Signed)
History and Physical Interval Note: ? ?09/27/2021 ?9:02 AM ? ?Kristina Schultz  has presented today for surgery, with the diagnosis of Screening, family hx of colon ca.  The various methods of treatment have been discussed with the patient and family. After consideration of risks, benefits and other options for treatment, the patient has consented to  Procedure(s) with comments: ?COLONOSCOPY WITH PROPOFOL (N/A) - HX SEIZURES, ASTHMA as a surgical intervention.  The patient's history has been reviewed, patient examined, no change in status, stable for surgery.  I have reviewed the patient's chart and labs.  Questions were answered to the patient's satisfaction.   ? ? ?[place], [place] ? ? ?

## 2021-09-27 NOTE — H&P (Signed)
Outpatient short stay form Pre-procedure ?09/27/2021 9:01 AM ?Kristina Apley K. Kristina Schultz, M.D. ? ?Primary Physician: Salome Holmes, M.D. ? ?Reason for visit:  Family history of colon cancer  ? ?History of present illness:   51 year old patient presenting for family history of colon cancer. Patient denies any change in bowel habits, rectal bleeding or involuntary weight loss. ? ? ? ?No current facility-administered medications for this encounter. ? ?Medications Prior to Admission  ?Medication Sig Dispense Refill Last Dose  ? ergocalciferol (VITAMIN D2) 1.25 MG (50000 UT) capsule Take 50,000 Units by mouth once a week.     ? lovastatin (MEVACOR) 10 MG tablet Take 10 mg by mouth at bedtime.     ? acetaminophen (TYLENOL) 500 MG tablet Take 500 mg by mouth every 6 (six) hours as needed for pain.     ? albuterol (PROVENTIL HFA;VENTOLIN HFA) 108 (90 BASE) MCG/ACT inhaler Inhale 2 puffs into the lungs every 6 (six) hours as needed for wheezing or shortness of breath. 1 Inhaler 2   ? alum & mag hydroxide-simeth (MAALOX PLUS) 400-400-40 MG/5ML suspension Take 5 mLs by mouth every 6 (six) hours as needed for indigestion.     ? aspirin EC 325 MG tablet Take 1 tablet (325 mg total) by mouth daily. 45 tablet 0   ? doxycycline (VIBRAMYCIN) 100 MG capsule Take 1 capsule (100 mg total) by mouth 2 (two) times daily. 20 capsule 0   ? DULoxetine (CYMBALTA) 30 MG capsule Take 30 mg by mouth at bedtime.     ? famotidine (PEPCID) 20 MG tablet Take 1 tablet (20 mg total) by mouth 2 (two) times daily. 60 tablet 0   ? HYDROcodone-acetaminophen (NORCO/VICODIN) 5-325 MG tablet Take 1-2 tablets by mouth every 6 (six) hours as needed for moderate pain. 20 tablet 0   ? levETIRAcetam (KEPPRA) 750 MG tablet Take 375 mg by mouth at bedtime.     ? naproxen sodium (ALEVE) 220 MG tablet Take 220 mg by mouth.     ? ondansetron (ZOFRAN) 4 MG tablet Take 1 tablet (4 mg total) by mouth every 8 (eight) hours as needed for nausea or vomiting. 30 tablet 0   ?  triamcinolone ointment (KENALOG) 0.1 % Apply 1 application topically daily as needed for rash.     ? ? ? ?Allergies  ?Allergen Reactions  ? Atenolol Anaphylaxis and Swelling  ?  SWELLING, BREATHING ISSUES  ? Pancreatin Nausea Only  ?  Other reaction(s): Respiratory Distress ?Headache  ? Lamictal [Lamotrigine] Rash  ? Lisinopril Other (See Comments)  ?  Pancreatitis ?  ? Losartan   ?  Leg pain  ? Celexa [Citalopram Hydrobromide] Rash  ?  Nosebleed/rash  ? Ecotrin [Aspirin] Rash  ? Penicillins   ?   ?GAVE PATIENT A UTI/yeast infection ?Tolerated Cefazolin prior.  ? Prednisone Other (See Comments)  ?  Dizzy and nausea. STOMACH CRAMPS  ? Sulfa Antibiotics Rash  ?  blisters  ? ? ? ?Past Medical History:  ?Diagnosis Date  ? Anxiety   ? Aortic atherosclerosis ([place])   ? Arthritis   ? Asthma   ? Bipolar 1 disorder ([place])   ? Complication of anesthesia   ? PREFERS NOT TO HAVE GAS IF POSSIBLE/STATES SHE SMELLS AND TASTE IT FOR DAYS  ? Diastasis recti   ? Diverticulosis   ? Fatty liver   ? GERD (gastroesophageal reflux disease)   ? Hiatal hernia   ? Hidradenitis suppurativa   ? History of pancreatitis   ?  Hyperlipemia   ? Hypertension   ? Seizures ([place])   ? last seizure 12-31-20  ? Vitamin D deficiency   ? ? ?Review of systems:  Otherwise negative.  ? ? ?Physical Exam ? ?Gen: Alert, oriented. Appears stated age.  ?HEENT: [place]/AT. PERRLA. ?Lungs: CTA, no wheezes. ?CV: RR nl S1, S2. ?Abd: soft, benign, no masses. BS+ ?Ext: No edema. Pulses 2+ ? ? ? ?Planned procedures: Proceed with colonoscopy. The patient understands the nature of the planned procedure, indications, risks, alternatives and potential complications including but not limited to bleeding, infection, perforation, damage to internal organs and possible oversedation/side effects from anesthesia. The patient agrees and gives consent to proceed.  ?Please refer to procedure notes for findings, recommendations and patient disposition/instructions.  ? ? ? ?Kristina Berte K. Kristina Schultz,  M.D. ?Gastroenterology ?09/27/2021  9:01 AM ? ? ? ? ? ? ?

## 2022-02-16 ENCOUNTER — Other Ambulatory Visit: Payer: Self-pay | Admitting: Family Medicine

## 2022-02-16 DIAGNOSIS — K760 Fatty (change of) liver, not elsewhere classified: Secondary | ICD-10-CM

## 2022-02-19 ENCOUNTER — Other Ambulatory Visit: Payer: Self-pay | Admitting: Family Medicine

## 2022-02-19 DIAGNOSIS — Z1231 Encounter for screening mammogram for malignant neoplasm of breast: Secondary | ICD-10-CM

## 2022-02-27 IMAGING — CT CT ABD-PELV W/ CM
2 of 5 series · 15 of 46 positions shown, 17 images · IV contrast (APPLIED)
Comparison: CT Abdomen and Pelvis 10/16/2019 and earlier.

CLINICAL DATA: 49-year-old female with postprandial upper abdominal
pain yesterday, vomiting and diarrhea.

EXAM:
CT ABDOMEN AND PELVIS WITH CONTRAST
TECHNIQUE: Multidetector CT imaging of the abdomen and pelvis was performed
using the standard protocol following bolus administration of
intravenous contrast.
CONTRAST:  125mL OMNIPAQUE IOHEXOL 300 MG/ML  SOLN

[Series 2: routine abd/pel with · axial · 0.98mm/px · z∈[-518,-58]mm · 12 of 106 slices shown, 14 images]
[im 7/106  soft-tissue]
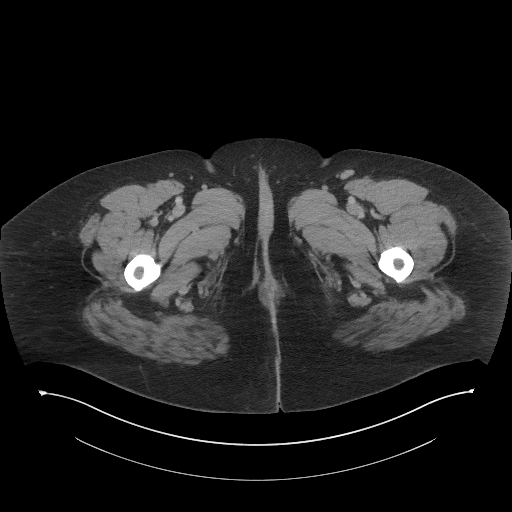
[im 7/106  bone]
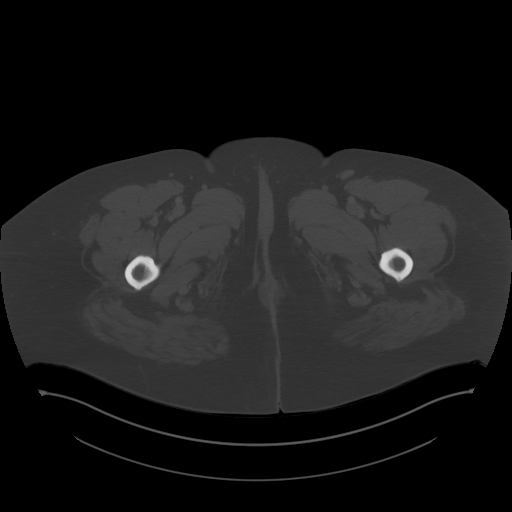
[im 19/106  soft-tissue]
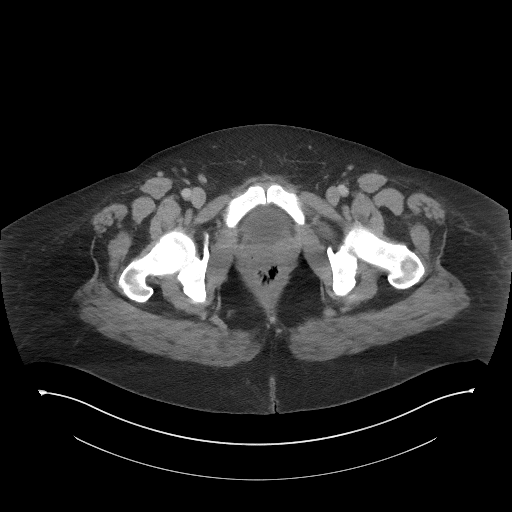
[im 25/106  soft-tissue]
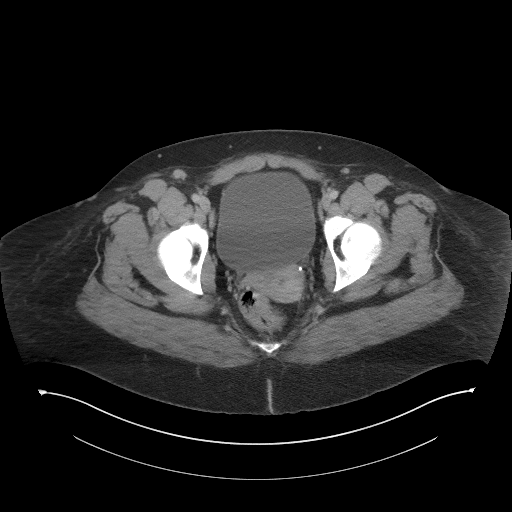
[im 31/106  soft-tissue]
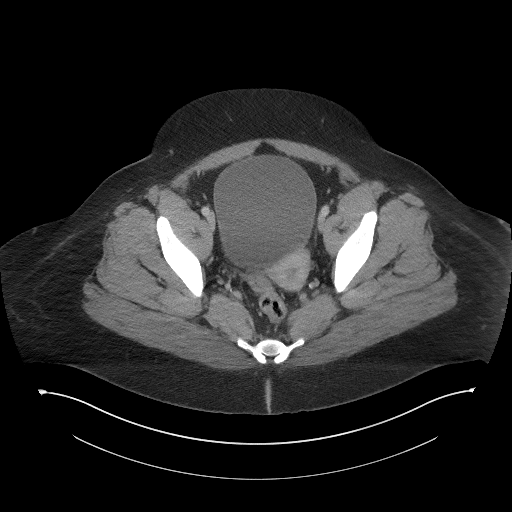
[im 44/106  soft-tissue]
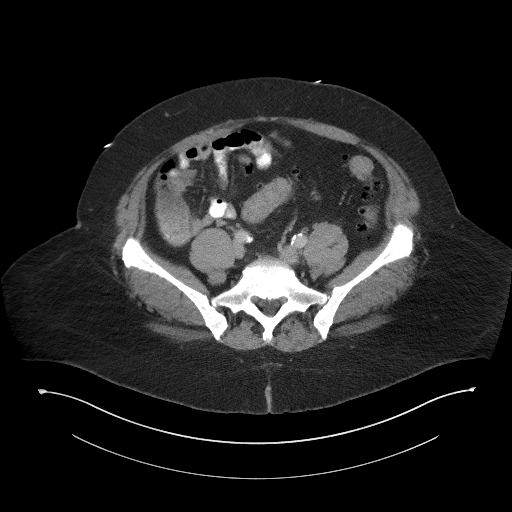
[im 50/106  soft-tissue]
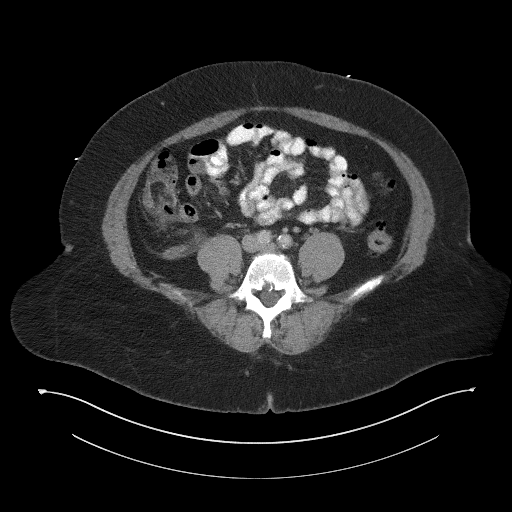
[im 56/106  soft-tissue]
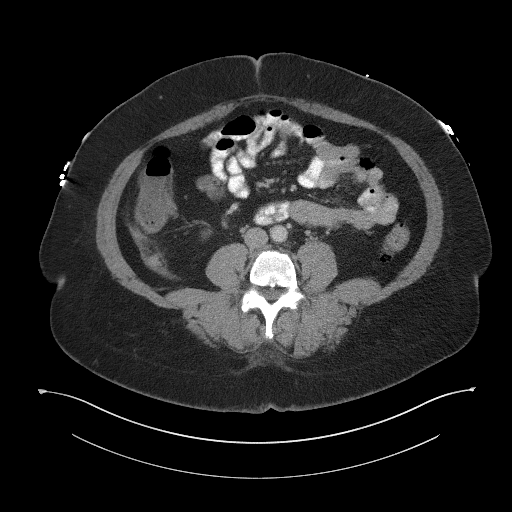
[im 68/106  soft-tissue]
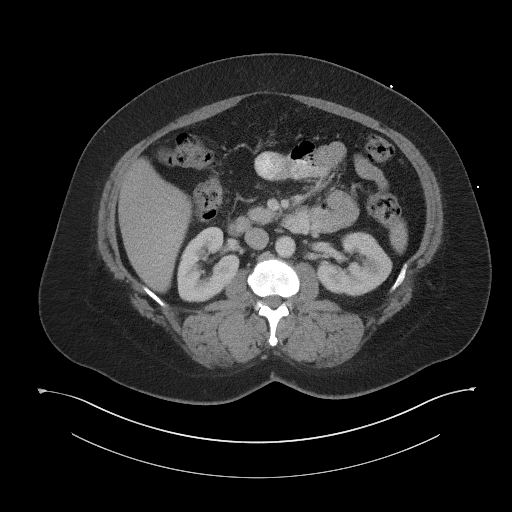
[im 75/106  soft-tissue]
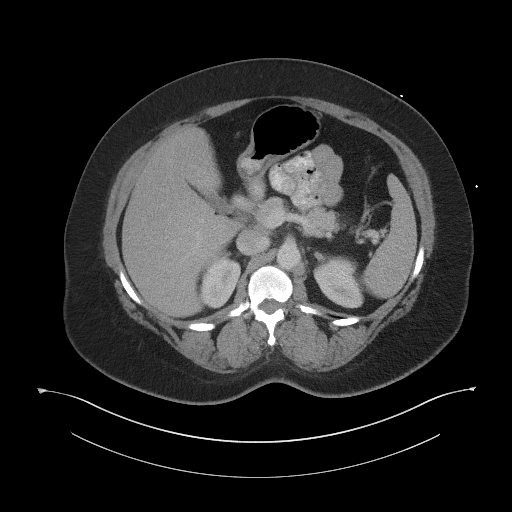
[im 75/106  bone]
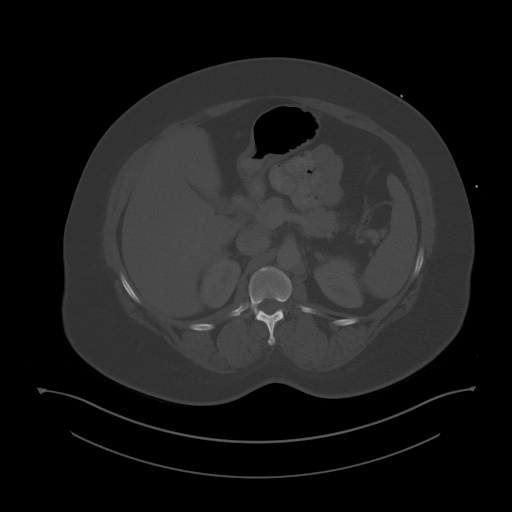
[im 81/106  soft-tissue]
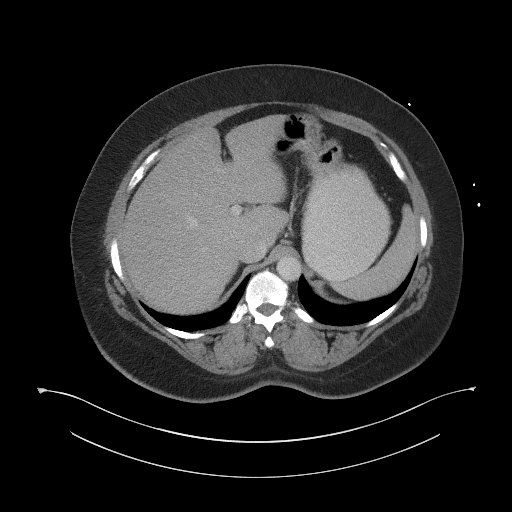
[im 93/106  soft-tissue]
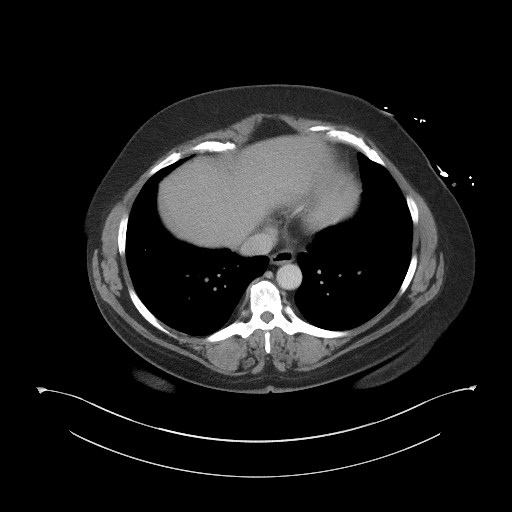
[im 99/106  soft-tissue]
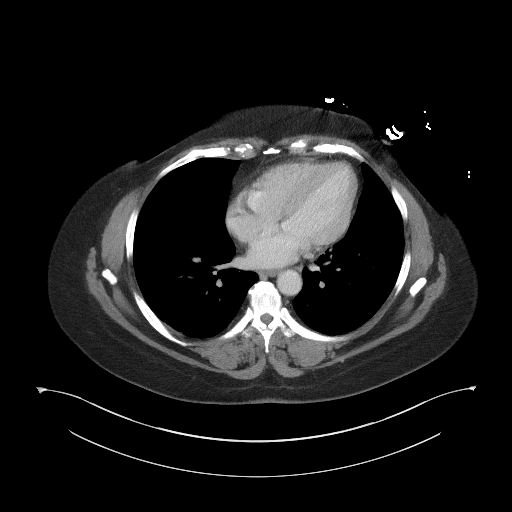

[Series 5: coronal st · coronal · 0.76mm/px · 3 of 107 slices shown]
[im 36/107  soft-tissue]
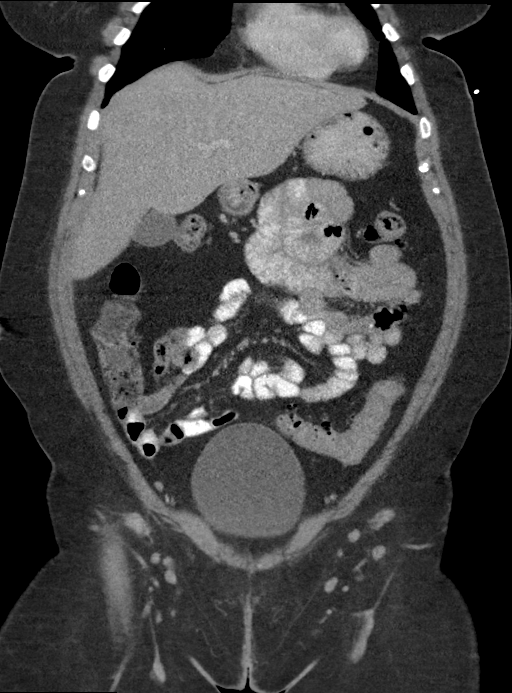
[im 48/107  soft-tissue]
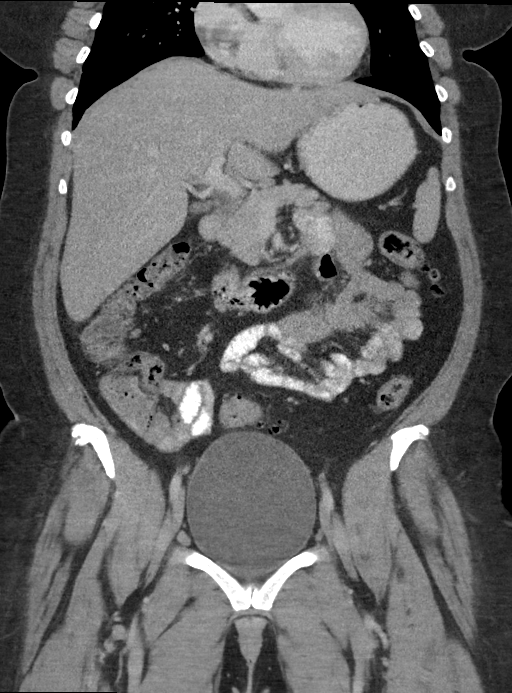
[im 59/107  soft-tissue]
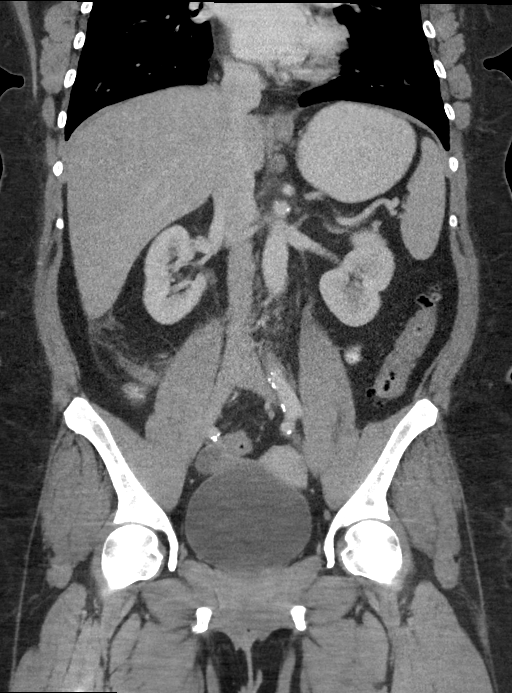

[15 of 46 positions shown; findings below may reference images not displayed]

FINDINGS: Lower chest: Negative.

Hepatobiliary: Negative liver and gallbladder. No bile duct
enlargement.

Pancreas: Negative.

Spleen: Negative.

Adrenals/Urinary Tract: Stable. The urinary bladder is moderately
distended today. Symmetric renal enhancement and contrast excretion.

Stomach/Bowel: Extensive diverticulosis in the large bowel from the
splenic flexure through the sigmoid, although no active inflammation
there. Occasional diverticula in the right colon.

Abnormally enlarged and inflamed retrocecal appendix (coronal image
61).

Appendix: Location: Retrocecal, extending cephalad in the right
gutter

Diameter: Up to 9 mm

Appendicolith: None identified

Mucosal hyper-enhancement: Positive

Extraluminal gas: Negative

Periappendiceal collection: Trace free fluid in the gutter with para
appendiceal inflammation. No organized or drainable collection.

Terminal ileum and distal small bowel remain within normal limits.
Oral contrast has not yet reached the TI. The stomach is mildly
distended with contrast. Negative duodenum. Small fat containing
midline supraumbilical hernia is stable since last year. No free
air.

Vascular/Lymphatic: Aortoiliac calcified atherosclerosis. Major
arterial structures are patent. No lymphadenopathy.

Reproductive: Negative.

Other: No pelvic free fluid.

Musculoskeletal: Stable visualized osseous structures.
IMPRESSION: 1. Positive for Acute Appendicitis. Trace free fluid in the right
gutter but no evidence of perforation or abscess.

2. No other acute finding. Extensive large bowel diverticulosis
without active inflammation. Aortic Atherosclerosis ([id number]).

## 2022-03-02 ENCOUNTER — Ambulatory Visit
Admission: RE | Admit: 2022-03-02 | Discharge: 2022-03-02 | Disposition: A | Discharge status: Home / Self Care | Payer: Medicare (Managed Care) | Source: Ambulatory Visit | Attending: Family Medicine | Admitting: Family Medicine

## 2022-03-02 DIAGNOSIS — K760 Fatty (change of) liver, not elsewhere classified: Secondary | ICD-10-CM | POA: Insufficient documentation

## 2022-03-07 ENCOUNTER — Ambulatory Visit
Admission: RE | Admit: 2022-03-07 | Discharge: 2022-03-07 | Disposition: A | Discharge status: Home / Self Care | Payer: Medicare (Managed Care) | Source: Ambulatory Visit | Attending: Family Medicine | Admitting: Family Medicine

## 2022-03-07 DIAGNOSIS — Z1231 Encounter for screening mammogram for malignant neoplasm of breast: Secondary | ICD-10-CM | POA: Diagnosis not present

## 2022-06-11 ENCOUNTER — Encounter: Payer: Self-pay | Admitting: Dietician

## 2022-06-11 ENCOUNTER — Encounter: Payer: Medicare (Managed Care) | Attending: Family Medicine | Admitting: Dietician

## 2022-06-11 VITALS — Ht 71.0 in | Wt 270.9 lb

## 2022-06-11 DIAGNOSIS — Z713 Dietary counseling and surveillance: Secondary | ICD-10-CM | POA: Insufficient documentation

## 2022-06-11 DIAGNOSIS — K760 Fatty (change of) liver, not elsewhere classified: Secondary | ICD-10-CM

## 2022-06-11 DIAGNOSIS — F319 Bipolar disorder, unspecified: Secondary | ICD-10-CM

## 2022-06-11 DIAGNOSIS — R7303 Prediabetes: Secondary | ICD-10-CM | POA: Diagnosis present

## 2022-06-11 DIAGNOSIS — E669 Obesity, unspecified: Secondary | ICD-10-CM

## 2022-06-11 NOTE — Patient Instructions (Signed)
Reduce restaurant meals and eat  more meals at home; start with planning for preparing one mealtime such as lunches at home, or 1 breakfast, 1 lunch, and 1 dinner each week, and gradually increase the number.  OK to use some healthy choice, lean cuisine frozen meals and add extra vegetables or fruit with it.  Make some healthy choices when at restaurants, like choosing a salad or fruit up with a burger or sandwich rather than fries.  Continue to try low sugar options for drinks; Sparkling Ice drinks (if no aspartame sweetener); propel water, splash water, or mix a small amount of juice drink (like Ocean Spray Diet 5, or Healthy Balance juice drink) with seltzer water.

## 2022-06-11 NOTE — Progress Notes (Signed)
Medical Nutrition Therapy: Visit start time: 1100  end time: 1210  Assessment:   Referral Diagnosis: prediabetes Other medical history/ diagnoses: HTN, hyperlipidemia, diveritculosis, arthritis Psychosocial issues/ stress concerns: bipolar disorder  Medications, supplements: reconciled list in medical record   Preferred learning method:  Hands-on    Current weight: 270.9lbs Height: 5'11" BMI: 37.78 Patient's personal weight goal: 185lbs  Progress and evaluation:  Lives and shares meals with mother who does not like some things ie frozen meals  They have been eating most meals out, but are ready to change to more meals at home. Patient reports some GI distress in am, and so often skips breakfast and sometimes lunch also. She reports difficulty in changing beverage choices; tries some lower sugar options but then reverts back to [place] or [place].  Recent HbA1C was 6.2% (02/16/22) Food allergies: intolerance to dairy/ lactose? Special diet practices: none Patient reports symptoms of depression with occasional thoughts of self harm without specific plans. She has had recent medication change and is in process of scheduling therapy visit. She declined additional help.  Patient seeks help with diet changes to prevent diabetes and reduce other health risk Next PCP appt is 01/2023   Dietary Intake:  Usual eating pattern includes 1-3 meals and 0-2 snacks per day. Dining out frequency: 12 meals per week. Who plans meals/ buys groceries? Self, sometimes mother   Who prepares meals? Self, sometimes mother  Breakfast: [place] biscuit or combo meal; occ at home; sometimes skips due to upset stomach Snack: none Lunch: often fast food or other restaurant; occ skips Snack: occ chip/ cheetos; peanuts;  Supper: leftovers from lunch; or out; cooking at home is rare Snack: grapes; peanut butter crakers; orange Beverages: soda, water (mostly at night); more water in summer than in  winter  Physical activity: no regular exercise   Intervention:   Nutrition Care Education:   Basic nutrition: basic food groups; appropriate nutrient balance; appropriate meal and snack schedule; general nutrition guidelines    Weight control: importance of low sugar and low fat choices; appropriate portion sizes; estimated energy needs for weight loss at 1500 kcal, provided guidance for 45% CHO, 25% pro, (30% fat) Advanced nutrition: cooking techniques for quick and simple meals, batch cooking and freezing, planning ahead for homemade meals and ensuring supplies are on hand. Prediabetes: appropriate meal and snack schedule; appropriate carb intake and balance, healthy carb choices; role of fiber, protein, fat; physical activity   Other intervention notes: Patient voices readiness to work on diet and lifestyle changes; mother voices readiness also and states she wants to support patient.  Discussed making gradual, step-wise changes and limiting to 1-2 changes at a time.  Established nutrition goals with direction from patient.  She declined scheduling follow up at this time but will schedule later as needed.   Nutritional Diagnosis:  [place]-2.2 Altered nutrition-related laboratory As related to prediabetes.  As evidenced by elevated HbA1C. [place]-3.3 Overweight/obesity As related to excess calories, stress/ depression, inadequate physical activity.  As evidenced by patient with current BMI of 37.7.   Education Materials given:  Museum/gallery conservator with food lists, sample meal pattern Sample menus Snacking handout Visit summary with goals/ instructions   Learner/ who was taught:  Patient  Family member: mother Sherrise Liberto  Level of understanding: Verbalizes/ demonstrates competency   Demonstrated degree of understanding via:   Teach back Learning barriers: None  Willingness to learn/ readiness for change: Acceptance, ready for change  Monitoring and Evaluation:  Dietary intake,  exercise,  BG control, and body weight      follow up: prn

## 2022-08-16 ENCOUNTER — Other Ambulatory Visit: Payer: Self-pay | Admitting: Neurology

## 2022-08-16 DIAGNOSIS — R569 Unspecified convulsions: Secondary | ICD-10-CM

## 2022-08-23 ENCOUNTER — Ambulatory Visit
Admission: RE | Admit: 2022-08-23 | Discharge: 2022-08-23 | Disposition: A | Discharge status: Home / Self Care | Payer: Medicare (Managed Care) | Source: Ambulatory Visit | Attending: Neurology | Admitting: Neurology

## 2022-08-23 DIAGNOSIS — R569 Unspecified convulsions: Secondary | ICD-10-CM | POA: Insufficient documentation

## 2022-08-23 MED ORDER — GADOBUTROL 1 MMOL/ML IV SOLN
10.0000 mL | Freq: Once | INTRAVENOUS | Status: AC | PRN
  Administered 2022-08-23: 10 mL via INTRAVENOUS

## 2022-08-26 ENCOUNTER — Emergency Department
Admission: EM | Admit: 2022-08-26 | Discharge: 2022-08-26 | Disposition: A | Discharge status: Home / Self Care | Payer: Medicare (Managed Care) | Attending: Emergency Medicine | Admitting: Emergency Medicine

## 2022-08-26 ENCOUNTER — Other Ambulatory Visit: Payer: Self-pay

## 2022-08-26 ENCOUNTER — Emergency Department: Payer: Medicare (Managed Care)

## 2022-08-26 DIAGNOSIS — K8500 Idiopathic acute pancreatitis without necrosis or infection: Secondary | ICD-10-CM | POA: Insufficient documentation

## 2022-08-26 DIAGNOSIS — R1032 Left lower quadrant pain: Secondary | ICD-10-CM | POA: Diagnosis present

## 2022-08-26 LAB — URINALYSIS, ROUTINE W REFLEX MICROSCOPIC
Bilirubin Urine: NEGATIVE
Glucose, UA: NEGATIVE mg/dL
Ketones, ur: NEGATIVE mg/dL
Leukocytes,Ua: NEGATIVE
Nitrite: NEGATIVE
Protein, ur: NEGATIVE mg/dL
Specific Gravity, Urine: 1.011 (ref 1.005–1.030)
pH: 8 (ref 5.0–8.0)

## 2022-08-26 LAB — COMPREHENSIVE METABOLIC PANEL
ALT: 13 U/L (ref 0–44)
AST: 27 U/L (ref 15–41)
Albumin: 4.1 g/dL (ref 3.5–5.0)
Alkaline Phosphatase: 92 U/L (ref 38–126)
Anion gap: 9 (ref 5–15)
BUN: 9 mg/dL (ref 6–20)
CO2: 24 mmol/L (ref 22–32)
Calcium: 9.4 mg/dL (ref 8.9–10.3)
Chloride: 97 mmol/L — ABNORMAL LOW (ref 98–111)
Creatinine, Ser: 0.89 mg/dL (ref 0.44–1.00)
GFR, Estimated: >60 mL/min (ref >60)
Glucose, Bld: 123 mg/dL — ABNORMAL HIGH (ref 70–99)
Potassium: 4.2 mmol/L (ref 3.5–5.1)
Sodium: 130 mmol/L — ABNORMAL LOW (ref 135–145)
Total Bilirubin: 1.2 mg/dL (ref 0.3–1.2)
Total Protein: 8.2 g/dL — ABNORMAL HIGH (ref 6.5–8.1)

## 2022-08-26 LAB — CBC
HCT: 38.9 % (ref 36.0–46.0)
Hemoglobin: 12.7 g/dL (ref 12.0–15.0)
MCH: 29.1 pg (ref 26.0–34.0)
MCHC: 32.6 g/dL (ref 30.0–36.0)
MCV: 89.2 fL (ref 80.0–100.0)
Platelets: 304 10*3/uL (ref 150–400)
RBC: 4.36 MIL/uL (ref 3.87–5.11)
RDW: 12.5 % (ref 11.5–15.5)
WBC: 7.8 10*3/uL (ref 4.0–10.5)
nRBC: 0 % (ref 0.0–0.2)

## 2022-08-26 LAB — LIPASE, BLOOD: Lipase: 112 U/L — ABNORMAL HIGH (ref 11–51)

## 2022-08-26 MED ORDER — LACTATED RINGERS IV BOLUS
1000.0000 mL | Freq: Once | INTRAVENOUS | Status: AC
  Administered 2022-08-26: 1000 mL via INTRAVENOUS

## 2022-08-26 MED ORDER — ACETAMINOPHEN 500 MG PO TABS
1000.0000 mg | ORAL_TABLET | Freq: Once | ORAL | Status: AC
  Administered 2022-08-26: 1000 mg via ORAL
  Filled 2022-08-26: qty 2

## 2022-08-26 MED ORDER — ONDANSETRON 4 MG PO TBDP: 4.0000 mg | ORAL_TABLET | Freq: Three times a day (TID) | ORAL | 0 refills | Status: DC | PRN

## 2022-08-26 MED ORDER — IOHEXOL 300 MG/ML  SOLN
100.0000 mL | Freq: Once | INTRAMUSCULAR | Status: AC | PRN
  Administered 2022-08-26: 100 mL via INTRAVENOUS

## 2022-08-26 MED ORDER — OXYCODONE HCL 5 MG PO TABS
5.0000 mg | ORAL_TABLET | Freq: Once | ORAL | Status: AC
  Administered 2022-08-26: 5 mg via ORAL
  Filled 2022-08-26: qty 1

## 2022-08-26 MED ORDER — OXYCODONE HCL 5 MG PO TABS: 5.0000 mg | ORAL_TABLET | Freq: Three times a day (TID) | ORAL | 0 refills | Status: DC | PRN

## 2022-08-26 MED ORDER — MORPHINE SULFATE (PF) 4 MG/ML IV SOLN
4.0000 mg | Freq: Once | INTRAVENOUS | Status: AC
  Administered 2022-08-26: 4 mg via INTRAVENOUS
  Filled 2022-08-26: qty 1

## 2022-08-26 NOTE — ED Notes (Signed)
ED Provider at bedside. 

## 2022-08-26 NOTE — Discharge Instructions (Addendum)
Use Tylenol for pain and fevers.  Up to 1000 mg per dose, up to 4 times per day.  Do not take more than 4000 mg of Tylenol/acetaminophen within 24 hours..  Use naproxen/Aleve for anti-inflammatory pain relief. Use up to 500mg  every 12 hours. Do not take more frequently than this. Do not use other NSAIDs (ibuprofen, Advil) while taking this medication. It is safe to take Tylenol with this.   Use Oxycodone as needed for more severe/breakthrough pain. Do not drive or operate machinery while taking this medication

## 2022-08-26 NOTE — ED Triage Notes (Signed)
Pt reports LUQ back pain with radiation to L upper back. Reports onset Friday and states that she believes she is having a pancreatitis attack. Reports n/v as well. Reports hx of same and that pain feels the same. Pt alert and oriented. Tearful. Breathing unlabored speaking in full sentences.

## 2022-08-26 NOTE — ED Provider Notes (Signed)
[facility] [place] Provider Note    Event Date/Time   First MD Initiated Contact with Patient 08/26/22 [phone]     (approximate)   History   Abdominal Pain   HPI  Kristina Schultz is a 52 y.o. female who presents to the ED for evaluation of Abdominal Pain   Patient presents to the ED alongside her partner for evaluation of central and left-sided abdominal pain.  She reports that is consistent with previous episodes of pancreatitis in the past.  Denies any ethanol use.  Denies nausea, emesis, fever, stool changes or urinary changes.   Physical Exam   Triage Vital Signs: ED Triage Vitals  Enc Vitals Group     BP 08/26/22 0251 (!) 167/112     Pulse Rate 08/26/22 0251 80     Resp 08/26/22 0251 18     Temp 08/26/22 0251 97.9 F (36.6 C)     Temp Source 08/26/22 0251 Oral     SpO2 08/26/22 0251 97 %     Weight 08/26/22 0250 270 lb (122.5 kg)     Height 08/26/22 0250 5\' 11"  (1.803 m)     Head Circumference --      Peak Flow --      Pain Score 08/26/22 0249 10     Pain Loc --      Pain Edu? --      Excl. in [place]? --     Most recent vital signs: Vitals:   08/26/22 0430 08/26/22 0600  BP: (!) 154/106 (!) 169/94  Pulse: 64 62  Resp: 18 18  Temp:    SpO2: 98% 97%    General: Awake, no distress.  CV:  Good peripheral perfusion.  Resp:  Normal effort.  Abd:  No distention.  Mild periumbilical tenderness without peritoneal features or guarding MSK:  No deformity noted.  Neuro:  No focal deficits appreciated. Other:     ED Results / Procedures / Treatments   Labs (all labs ordered are listed, but only abnormal results are displayed) Labs Reviewed  LIPASE, BLOOD - Abnormal; Notable for the following components:      Result Value   Lipase 112 (*)    All other components within normal limits  COMPREHENSIVE METABOLIC PANEL - Abnormal; Notable for the following components:   Sodium 130 (*)    Chloride 97 (*)    Glucose, Bld 123 (*)    Total Protein 8.2  (*)    All other components within normal limits  URINALYSIS, ROUTINE W REFLEX MICROSCOPIC - Abnormal; Notable for the following components:   Color, Urine STRAW (*)    APPearance HAZY (*)    Hgb urine dipstick SMALL (*)    Bacteria, UA MANY (*)    All other components within normal limits  CBC  POC URINE PREG, ED    EKG   RADIOLOGY CT abdomen/pelvis interpreted by me with mild peripancreatic inflammation.  I reviewed lung windows with some mild groundglass opacities  Official radiology report(s): No results found.  PROCEDURES and INTERVENTIONS:  Procedures  Medications  lactated ringers bolus 1,000 mL (0 mLs Intravenous Stopped 08/26/22 0435)  morphine (PF) 4 MG/ML injection 4 mg (4 mg Intravenous Given 08/26/22 0337)  iohexol (OMNIPAQUE) 300 MG/ML solution 100 mL (100 mLs Intravenous Contrast Given 08/26/22 0501)  acetaminophen (TYLENOL) tablet 1,000 mg (1,000 mg Oral Given 08/26/22 0557)  oxyCODONE (Oxy IR/ROXICODONE) immediate release tablet 5 mg (5 mg Oral Given 08/26/22 0557)     IMPRESSION /  MDM / ASSESSMENT AND PLAN / ED COURSE  I reviewed the triage vital signs and the nursing notes.  Differential diagnosis includes, but is not limited to, pancreatitis, SBO, cholelithiasis, IBS, diverticulitis  {Patient presents with symptoms of an acute illness or injury that is potentially life-threatening.  52 year old woman presents with abdominal pain and evidence of idiopathic pancreatitis suitable for trial of outpatient management.  Has some mild tenderness on exam but largely looks well.  Blood work with slight elevation in lipase to the low 100s.  LFTs are normal as well as her CBC.  Urine without infectious features.  CT confirms peripancreatic inflammation without complicating features.  Symptoms are controlled and she is suitable for trial of outpatient management  Clinical Course as of 08/26/22 [id number]  Sun Aug 26, 2022  0548 Reassessed and discussed workup. Reports her  pain is much better [DS]  0632 Reassessed. Feels well enough with the oral analgesics and is suitable for outpatient management [DS]    Clinical Course User Index [DS] Vladimir Crofts, MD     FINAL CLINICAL IMPRESSION(S) / ED DIAGNOSES   Final diagnoses:  Idiopathic acute pancreatitis without infection or necrosis     Rx / DC Orders   ED Discharge Orders          Ordered    oxyCODONE (ROXICODONE) 5 MG immediate release tablet  Every 8 hours PRN        08/26/22 0554    ondansetron (ZOFRAN-ODT) 4 MG disintegrating tablet  Every 8 hours PRN        08/26/22 0554             Note:  This document was prepared using Dragon voice recognition software and may include unintentional dictation errors.   Vladimir Crofts, MD 08/26/22 [phone]

## 2022-11-07 ENCOUNTER — Other Ambulatory Visit: Payer: Self-pay

## 2022-11-07 ENCOUNTER — Emergency Department
Admission: EM | Admit: 2022-11-07 | Discharge: 2022-11-07 | Disposition: A | Discharge status: Home / Self Care | Payer: Medicare (Managed Care) | Attending: Emergency Medicine | Admitting: Emergency Medicine

## 2022-11-07 DIAGNOSIS — R1013 Epigastric pain: Secondary | ICD-10-CM | POA: Diagnosis present

## 2022-11-07 DIAGNOSIS — I10 Essential (primary) hypertension: Secondary | ICD-10-CM | POA: Insufficient documentation

## 2022-11-07 DIAGNOSIS — K8500 Idiopathic acute pancreatitis without necrosis or infection: Secondary | ICD-10-CM | POA: Diagnosis not present

## 2022-11-07 LAB — CBC WITH DIFFERENTIAL/PLATELET
Abs Immature Granulocytes: 0.02 10*3/uL (ref 0.00–0.07)
Basophils Absolute: 0 10*3/uL (ref 0.0–0.1)
Basophils Relative: 0 %
Eosinophils Absolute: 0.1 10*3/uL (ref 0.0–0.5)
Eosinophils Relative: 1 %
HCT: 38.6 % (ref 36.0–46.0)
Hemoglobin: 12.7 g/dL (ref 12.0–15.0)
Immature Granulocytes: 0 %
Lymphocytes Relative: 26 %
Lymphs Abs: 2.2 10*3/uL (ref 0.7–4.0)
MCH: 29.5 pg (ref 26.0–34.0)
MCHC: 32.9 g/dL (ref 30.0–36.0)
MCV: 89.6 fL (ref 80.0–100.0)
Monocytes Absolute: 0.5 10*3/uL (ref 0.1–1.0)
Monocytes Relative: 6 %
Neutro Abs: 5.7 10*3/uL (ref 1.7–7.7)
Neutrophils Relative %: 67 %
Platelets: 295 10*3/uL (ref 150–400)
RBC: 4.31 MIL/uL (ref 3.87–5.11)
RDW: 12.4 % (ref 11.5–15.5)
WBC: 8.5 10*3/uL (ref 4.0–10.5)
nRBC: 0 % (ref 0.0–0.2)

## 2022-11-07 LAB — COMPREHENSIVE METABOLIC PANEL
ALT: 16 U/L (ref 0–44)
AST: 21 U/L (ref 15–41)
Albumin: 4.2 g/dL (ref 3.5–5.0)
Alkaline Phosphatase: 95 U/L (ref 38–126)
Anion gap: 12 (ref 5–15)
BUN: 10 mg/dL (ref 6–20)
CO2: 21 mmol/L — ABNORMAL LOW (ref 22–32)
Calcium: 9.6 mg/dL (ref 8.9–10.3)
Chloride: 98 mmol/L (ref 98–111)
Creatinine, Ser: 0.86 mg/dL (ref 0.44–1.00)
GFR, Estimated: >60 mL/min (ref >60)
Glucose, Bld: 109 mg/dL — ABNORMAL HIGH (ref 70–99)
Potassium: 3.7 mmol/L (ref 3.5–5.1)
Sodium: 131 mmol/L — ABNORMAL LOW (ref 135–145)
Total Bilirubin: 0.7 mg/dL (ref 0.3–1.2)
Total Protein: 8.2 g/dL — ABNORMAL HIGH (ref 6.5–8.1)

## 2022-11-07 LAB — LIPASE, BLOOD: Lipase: 238 U/L — ABNORMAL HIGH (ref 11–51)

## 2022-11-07 LAB — TROPONIN I (HIGH SENSITIVITY): Troponin I (High Sensitivity): 3 ng/L (ref <18)

## 2022-11-07 MED ORDER — MORPHINE SULFATE (PF) 4 MG/ML IV SOLN
4.0000 mg | Freq: Once | INTRAVENOUS | Status: AC
  Administered 2022-11-07: 4 mg via INTRAVENOUS
  Filled 2022-11-07: qty 1

## 2022-11-07 MED ORDER — OXYCODONE-ACETAMINOPHEN 5-325 MG PO TABS: 1.0000 | ORAL_TABLET | ORAL | 0 refills | Status: AC | PRN

## 2022-11-07 MED ORDER — SODIUM CHLORIDE 0.9 % IV BOLUS
1000.0000 mL | Freq: Once | INTRAVENOUS | Status: AC
  Administered 2022-11-07: 1000 mL via INTRAVENOUS

## 2022-11-07 MED ORDER — ONDANSETRON 4 MG PO TBDP: 4.0000 mg | ORAL_TABLET | Freq: Three times a day (TID) | ORAL | 0 refills | Status: DC | PRN

## 2022-11-07 MED ORDER — ONDANSETRON HCL 4 MG/2ML IJ SOLN
4.0000 mg | Freq: Once | INTRAMUSCULAR | Status: AC
  Administered 2022-11-07: 4 mg via INTRAVENOUS
  Filled 2022-11-07: qty 2

## 2022-11-07 NOTE — ED Triage Notes (Signed)
Pt presents to ER with c/o epigastric abd pain x3 days.  Pt reports pain starts in epigastric area, and moves to her back and LUQ. Pt reports pmh of pancreatitis, appendectomy, and hernia surgery x2. Pt states pain is constant and is a throbbing type pain.  Pt denies n/v/d, and is otherwise A&O x4 and in NAD.

## 2022-11-07 NOTE — ED Provider Notes (Signed)
[facility] Provider Note    Event Date/Time   First MD Initiated Contact with Patient 11/07/22 2039     (approximate)   History   Chief Complaint Abdominal Pain   HPI  Kristina Schultz is a 52 y.o. female with past medical history of hypertension, hyperlipidemia, seizures, bipolar disorder who presents to the ED complaining of abdominal pain.  Patient reports that she has had increasing pain in her epigastrium radiating towards her back for about the past 3 days.  Pain is described as sharp and stabbing, worse when she goes to eat something.  She has felt nauseous at times but has not vomited and denies any changes in her bowel movements.  She has not had any fevers, cough, chest pain, shortness of breath, dysuria, hematuria, or flank pain.  She describes symptoms as similar to when she has dealt with pancreatitis in the past.  She denies significant alcohol consumption.     Physical Exam   Triage Vital Signs: ED Triage Vitals [11/07/22 1923]  Enc Vitals Group     BP (!) 139/92     Pulse Rate 99     Resp 17     Temp 98.6 F (37 C)     Temp Source Oral     SpO2 97 %     Weight 273 lb (123.8 kg)     Height 5\' 11"  (1.803 m)     Head Circumference      Peak Flow      Pain Score 7     Pain Loc      Pain Edu?      Excl. in [place]?     Most recent vital signs: Vitals:   11/07/22 1923  BP: (!) 139/92  Pulse: 99  Resp: 17  Temp: 98.6 F (37 C)  SpO2: 97%    Constitutional: Alert and oriented. Eyes: Conjunctivae are normal. Head: Atraumatic. Nose: No congestion/rhinnorhea. Mouth/Throat: Mucous membranes are moist.  Cardiovascular: Normal rate, regular rhythm. Grossly normal heart sounds.  2+ radial pulses bilaterally. Respiratory: Normal respiratory effort.  No retractions. Lungs CTAB. Gastrointestinal: Soft and tender to palpation in the epigastrium with no rebound or guarding. No distention. Musculoskeletal: No lower extremity tenderness nor  edema.  Neurologic:  Normal speech and language. No gross focal neurologic deficits are appreciated.    ED Results / Procedures / Treatments   Labs (all labs ordered are listed, but only abnormal results are displayed) Labs Reviewed  COMPREHENSIVE METABOLIC PANEL - Abnormal; Notable for the following components:      Result Value   Sodium 131 (*)    CO2 21 (*)    Glucose, Bld 109 (*)    Total Protein 8.2 (*)    All other components within normal limits  LIPASE, BLOOD - Abnormal; Notable for the following components:   Lipase 238 (*)    All other components within normal limits  CBC WITH DIFFERENTIAL/PLATELET  TROPONIN I (HIGH SENSITIVITY)     EKG  ED ECG REPORT I, Chesley Noon, the attending physician, personally viewed and interpreted this ECG.   Date: 11/07/2022  EKG Time: 19:32  Rate: 84  Rhythm: normal sinus rhythm  Axis: Normal  Intervals:none  ST&T Change: None  PROCEDURES:  Critical Care performed: No  Procedures   MEDICATIONS ORDERED IN ED: Medications  morphine (PF) 4 MG/ML injection 4 mg (4 mg Intravenous Given 11/07/22 2124)  ondansetron (ZOFRAN) injection 4 mg (4 mg Intravenous Given 11/07/22 2124)  sodium chloride 0.9 % bolus 1,000 mL (1,000 mLs Intravenous New Bag/Given 11/07/22 2124)     IMPRESSION / MDM / ASSESSMENT AND PLAN / ED COURSE  I reviewed the triage vital signs and the nursing notes.                              52 y.o. female with past medical history of hypertension, hyperlipidemia, seizures, and bipolar disorder who presents to the ED with increasing pain in her epigastrium over the past 3 days associated with nausea.  Patient's presentation is most consistent with acute presentation with potential threat to life or bodily function.  Differential diagnosis includes, but is not limited to, pancreatitis, hepatitis, cholecystitis, biliary colic, gastritis, dehydration, electrode abnormality, AKI.  Patient nontoxic-appearing and  in no acute distress, vital signs are unremarkable.  Her abdomen is soft but has focal tenderness in her epigastrium, description of radiation to the back is consistent with pancreatitis.  Lipase found to be elevated to 238, but remainder of labs are reassuring without significant anemia, electrolyte abnormality, or AKI.  EKG shows no evidence of arrhythmia or ischemia and troponin within normal limits, doubt cardiac etiology.  We will treat symptomatically with IV morphine and Zofran, hydrate with IV fluids.  She had a similar presentation 3 months ago, CT at that time was consistent with uncomplicated pancreatitis.  Do not feel patient warrants repeat CT scan at this time given overall reassuring labs and vital signs.  Plan to reassess following symptomatic management.  Patient reports feeling significantly better following morphine and Zofran, now tolerating oral intake without difficulty.  She is appropriate for outpatient management and follow-up with GI, was counseled to return to the ED for new or worsening symptoms.  Patient agrees with plan.      FINAL CLINICAL IMPRESSION(S) / ED DIAGNOSES   Final diagnoses:  Idiopathic acute pancreatitis, unspecified complication status     Rx / DC Orders   ED Discharge Orders          Ordered    oxyCODONE-acetaminophen (PERCOCET) 5-325 MG tablet  Every 4 hours PRN        11/07/22 2212    ondansetron (ZOFRAN-ODT) 4 MG disintegrating tablet  Every 8 hours PRN        11/07/22 2212             Note:  This document was prepared using Dragon voice recognition software and may include unintentional dictation errors.   Chesley Noon, MD 11/07/22 2213

## 2023-01-30 ENCOUNTER — Emergency Department
Admission: EM | Admit: 2023-01-30 | Discharge: 2023-01-30 | Disposition: A | Discharge status: Home / Self Care | Payer: Medicare (Managed Care) | Attending: Emergency Medicine | Admitting: Emergency Medicine

## 2023-01-30 ENCOUNTER — Emergency Department: Payer: Medicare (Managed Care)

## 2023-01-30 ENCOUNTER — Other Ambulatory Visit: Payer: Self-pay

## 2023-01-30 DIAGNOSIS — K859 Acute pancreatitis without necrosis or infection, unspecified: Secondary | ICD-10-CM | POA: Insufficient documentation

## 2023-01-30 DIAGNOSIS — R748 Abnormal levels of other serum enzymes: Secondary | ICD-10-CM | POA: Insufficient documentation

## 2023-01-30 DIAGNOSIS — R1084 Generalized abdominal pain: Secondary | ICD-10-CM | POA: Diagnosis present

## 2023-01-30 HISTORY — DX: Acute pancreatitis without necrosis or infection, unspecified: K85.90

## 2023-01-30 LAB — CBC
HCT: 38 % (ref 36.0–46.0)
Hemoglobin: 12.7 g/dL (ref 12.0–15.0)
MCH: 29.5 pg (ref 26.0–34.0)
MCHC: 33.4 g/dL (ref 30.0–36.0)
MCV: 88.4 fL (ref 80.0–100.0)
Platelets: 237 10*3/uL (ref 150–400)
RBC: 4.3 MIL/uL (ref 3.87–5.11)
RDW: 12.4 % (ref 11.5–15.5)
WBC: 6.4 10*3/uL (ref 4.0–10.5)
nRBC: 0 % (ref 0.0–0.2)

## 2023-01-30 LAB — URINALYSIS, ROUTINE W REFLEX MICROSCOPIC
Bilirubin Urine: NEGATIVE
Glucose, UA: NEGATIVE mg/dL
Ketones, ur: NEGATIVE mg/dL
Leukocytes,Ua: NEGATIVE
Nitrite: NEGATIVE
Protein, ur: 100 mg/dL — AB
Specific Gravity, Urine: 1.026 (ref 1.005–1.030)
pH: 6 (ref 5.0–8.0)

## 2023-01-30 LAB — COMPREHENSIVE METABOLIC PANEL
ALT: 15 U/L (ref 0–44)
AST: 21 U/L (ref 15–41)
Albumin: 4.2 g/dL (ref 3.5–5.0)
Alkaline Phosphatase: 75 U/L (ref 38–126)
Anion gap: 10 (ref 5–15)
BUN: 12 mg/dL (ref 6–20)
CO2: 22 mmol/L (ref 22–32)
Calcium: 9.2 mg/dL (ref 8.9–10.3)
Chloride: 99 mmol/L (ref 98–111)
Creatinine, Ser: 0.86 mg/dL (ref 0.44–1.00)
GFR, Estimated: >60 mL/min (ref >60)
Glucose, Bld: 114 mg/dL — ABNORMAL HIGH (ref 70–99)
Potassium: 3.9 mmol/L (ref 3.5–5.1)
Sodium: 131 mmol/L — ABNORMAL LOW (ref 135–145)
Total Bilirubin: 0.5 mg/dL (ref 0.3–1.2)
Total Protein: 8 g/dL (ref 6.5–8.1)

## 2023-01-30 LAB — POC URINE PREG, ED: Preg Test, Ur: NEGATIVE

## 2023-01-30 LAB — LIPASE, BLOOD: Lipase: 88 U/L — ABNORMAL HIGH (ref 11–51)

## 2023-01-30 MED ORDER — MORPHINE SULFATE (PF) 4 MG/ML IV SOLN: 4.0000 mg | Freq: Once | INTRAVENOUS | Status: DC

## 2023-01-30 MED ORDER — OXYCODONE HCL 5 MG PO TABS
5.0000 mg | ORAL_TABLET | Freq: Four times a day (QID) | ORAL | 0 refills | Status: AC | PRN
Start: 2023-01-30 — End: 2024-01-30

## 2023-01-30 MED ORDER — IOHEXOL 300 MG/ML  SOLN
100.0000 mL | Freq: Once | INTRAMUSCULAR | Status: AC | PRN
  Administered 2023-01-30: 100 mL via INTRAVENOUS

## 2023-01-30 MED ORDER — ONDANSETRON HCL 4 MG/2ML IJ SOLN
4.0000 mg | Freq: Once | INTRAMUSCULAR | Status: AC
  Administered 2023-01-30: 4 mg via INTRAVENOUS
  Filled 2023-01-30: qty 2

## 2023-01-30 MED ORDER — LACTATED RINGERS IV BOLUS
1000.0000 mL | Freq: Once | INTRAVENOUS | Status: AC
  Administered 2023-01-30: 1000 mL via INTRAVENOUS

## 2023-01-30 MED ORDER — MORPHINE SULFATE (PF) 4 MG/ML IV SOLN
6.0000 mg | Freq: Once | INTRAVENOUS | Status: AC
  Administered 2023-01-30: 6 mg via INTRAVENOUS
  Filled 2023-01-30: qty 2

## 2023-01-30 MED ORDER — ONDANSETRON 4 MG PO TBDP: 4.0000 mg | ORAL_TABLET | Freq: Three times a day (TID) | ORAL | 0 refills | Status: AC | PRN

## 2023-01-30 MED ORDER — NICOTINE 21 MG/24HR TD PT24: 21.0000 mg | MEDICATED_PATCH | TRANSDERMAL | 1 refills | Status: AC

## 2023-01-30 NOTE — ED Provider Notes (Signed)
[facility] Provider Note    Event Date/Time   First MD Initiated Contact with Patient 01/30/23 1057     (approximate)   History   Abdominal Pain   HPI  Kristina Schultz is a 52 y.o. female with history of recurrent pancreatitis here with diffuse abdominal pain.  The patient states that for the last several days, she separatively worsening epigastric and left upper quadrant abdominal pain.  Feels similar to her previous episodes of pancreatitis.  Denies specific alleviating factors.  She states that she gets idiopathic pancreatitis for years.  She is been taking over-the-counter medications and has switched to clear liquids without any improvement.  She has been vomiting and had difficulty keeping anything down.     Physical Exam   Triage Vital Signs: ED Triage Vitals  Encounter Vitals Group     BP 01/30/23 1020 (!) 146/104     Systolic BP Percentile --      Diastolic BP Percentile --      Pulse Rate 01/30/23 1020 80     Resp 01/30/23 1020 18     Temp 01/30/23 1019 97.6 F (36.4 C)     Temp src --      SpO2 01/30/23 1020 97 %     Weight 01/30/23 1019 255 lb (115.7 kg)     Height 01/30/23 1019 5\' 11"  (1.803 m)     Head Circumference --      Peak Flow --      Pain Score 01/30/23 1019 9     Pain Loc --      Pain Education --      Exclude from Growth Chart --     Most recent vital signs: Vitals:   01/30/23 1019 01/30/23 1020  BP:  (!) 146/104  Pulse:  80  Resp:  18  Temp: 97.6 F (36.4 C)   SpO2:  97%     General: Awake, no distress.  CV:  Good peripheral perfusion.  Resp:  Normal work of breathing.  Lungs clear to auscultation bilaterally. Abd:  No distention.  Moderate epigastric and left upper quadrant tenderness.  No rebound or guarding. Other:  Mildly dry mucous membranes.   ED Results / Procedures / Treatments   Labs (all labs ordered are listed, but only abnormal results are displayed) Labs Reviewed  LIPASE, BLOOD -  Abnormal; Notable for the following components:      Result Value   Lipase 88 (*)    All other components within normal limits  COMPREHENSIVE METABOLIC PANEL - Abnormal; Notable for the following components:   Sodium 131 (*)    Glucose, Bld 114 (*)    All other components within normal limits  URINALYSIS, ROUTINE W REFLEX MICROSCOPIC - Abnormal; Notable for the following components:   Color, Urine YELLOW (*)    APPearance CLOUDY (*)    Hgb urine dipstick MODERATE (*)    Protein, ur 100 (*)    Bacteria, UA FEW (*)    All other components within normal limits  CBC  POC URINE PREG, ED     EKG    RADIOLOGY CT abdomen/pelvis:Mild stranding in pancreatic tail, decreased but persistent, no there complications   I also independently reviewed and agree with radiologist interpretations.   PROCEDURES:  Critical Care performed: No   MEDICATIONS ORDERED IN ED: Medications  ondansetron (ZOFRAN) injection 4 mg (4 mg Intravenous Given 01/30/23 1153)  lactated ringers bolus 1,000 mL (0 mLs Intravenous Stopped 01/30/23 1415)  morphine (PF) 4 MG/ML injection 6 mg (6 mg Intravenous Given 01/30/23 1155)  iohexol (OMNIPAQUE) 300 MG/ML solution 100 mL (100 mLs Intravenous Contrast Given 01/30/23 1211)     IMPRESSION / MDM / ASSESSMENT AND PLAN / ED COURSE  I reviewed the triage vital signs and the nursing notes.                              Differential diagnosis includes, but is not limited to, acute pancreatitis, gastritis, peptic ulcer disease, kidney stone, enteritis, small bowel obstruction, pneumonia, atypical ACS  Patient's presentation is most consistent with acute presentation with potential threat to life or bodily function.  The patient is on the cardiac monitor to evaluate for evidence of arrhythmia and/or significant heart rate changes   52 yo well appearing female here with abdominal pain, nausea. Pain history, distribution is c/w her prior episodes of pancreatitis and suspect  acute on chronic pancreatitis. Denies EtOH use. Imaging reviewed, shows no acute abnormalities or complications. No cyst formation. CBC without leukocytosis or anemia. CMP with normal renal function, LFTs, bili. Lipase minimally but persistently elevated at 88. UA contaminated - denies any urinary sx.  Pt feels markedly improved with fluids, analgesia. She would like to attempt outpt management which I think is reasonable given her long h/o same. Will give analgesia, antiemetics, return precautions.   FINAL CLINICAL IMPRESSION(S) / ED DIAGNOSES   Final diagnoses:  Acute recurrent pancreatitis     Rx / DC Orders   ED Discharge Orders          Ordered    oxyCODONE (ROXICODONE) 5 MG immediate release tablet  Every 6 hours PRN        01/30/23 1400    ondansetron (ZOFRAN-ODT) 4 MG disintegrating tablet  Every 8 hours PRN        01/30/23 1400    nicotine (NICODERM CQ - DOSED IN MG/24 HOURS) 21 mg/24hr patch  Every 24 hours        01/30/23 1400             Note:  This document was prepared using Dragon voice recognition software and may include unintentional dictation errors.   Shaune Pollack, MD 01/30/23 [phone]

## 2023-01-30 NOTE — ED Triage Notes (Signed)
Pt to ED for mid abd pain radiating to back for the past few days. Denies urinary sx, n/v/d. Reports this happens d/t pancreatitis.

## 2023-01-30 NOTE — Discharge Instructions (Addendum)
For your bloating:  Mix 1 cap full or packet of Miralax in with water and take this 1-2x daily until BMs are soft, and regular  Take Colace stool softener daily for at least the next week. You can continue this to prevent constipation as well.

## 2023-02-08 ENCOUNTER — Other Ambulatory Visit: Payer: Self-pay

## 2023-02-08 ENCOUNTER — Emergency Department: Payer: Medicare (Managed Care)

## 2023-02-08 ENCOUNTER — Emergency Department
Admission: EM | Admit: 2023-02-08 | Discharge: 2023-02-08 | Disposition: A | Discharge status: Home / Self Care | Payer: Medicare (Managed Care) | Attending: Emergency Medicine | Admitting: Emergency Medicine

## 2023-02-08 DIAGNOSIS — K219 Gastro-esophageal reflux disease without esophagitis: Secondary | ICD-10-CM | POA: Diagnosis not present

## 2023-02-08 DIAGNOSIS — R1010 Upper abdominal pain, unspecified: Secondary | ICD-10-CM

## 2023-02-08 DIAGNOSIS — R1013 Epigastric pain: Secondary | ICD-10-CM | POA: Insufficient documentation

## 2023-02-08 DIAGNOSIS — I10 Essential (primary) hypertension: Secondary | ICD-10-CM | POA: Insufficient documentation

## 2023-02-08 DIAGNOSIS — R110 Nausea: Secondary | ICD-10-CM | POA: Insufficient documentation

## 2023-02-08 LAB — CBC
HCT: 38.2 % (ref 36.0–46.0)
Hemoglobin: 12.7 g/dL (ref 12.0–15.0)
MCH: 29.3 pg (ref 26.0–34.0)
MCHC: 33.2 g/dL (ref 30.0–36.0)
MCV: 88 fL (ref 80.0–100.0)
Platelets: 235 10*3/uL (ref 150–400)
RBC: 4.34 MIL/uL (ref 3.87–5.11)
RDW: 12.2 % (ref 11.5–15.5)
WBC: 7.2 10*3/uL (ref 4.0–10.5)
nRBC: 0 % (ref 0.0–0.2)

## 2023-02-08 LAB — COMPREHENSIVE METABOLIC PANEL
ALT: 21 U/L (ref 0–44)
AST: 26 U/L (ref 15–41)
Albumin: 4.2 g/dL (ref 3.5–5.0)
Alkaline Phosphatase: 82 U/L (ref 38–126)
Anion gap: 12 (ref 5–15)
BUN: 10 mg/dL (ref 6–20)
CO2: 22 mmol/L (ref 22–32)
Calcium: 9.1 mg/dL (ref 8.9–10.3)
Chloride: 97 mmol/L — ABNORMAL LOW (ref 98–111)
Creatinine, Ser: 0.92 mg/dL (ref 0.44–1.00)
GFR, Estimated: >60 mL/min (ref >60)
Glucose, Bld: 117 mg/dL — ABNORMAL HIGH (ref 70–99)
Potassium: 3.9 mmol/L (ref 3.5–5.1)
Sodium: 131 mmol/L — ABNORMAL LOW (ref 135–145)
Total Bilirubin: 0.6 mg/dL (ref 0.3–1.2)
Total Protein: 8.2 g/dL — ABNORMAL HIGH (ref 6.5–8.1)

## 2023-02-08 LAB — LIPID PANEL
Cholesterol: 170 mg/dL (ref 0–200)
HDL: 34 mg/dL — ABNORMAL LOW (ref >40)
LDL Cholesterol: 112 mg/dL — ABNORMAL HIGH (ref 0–99)
Total CHOL/HDL Ratio: 5 ratio
Triglycerides: 122 mg/dL (ref <150)
VLDL: 24 mg/dL (ref 0–40)

## 2023-02-08 LAB — LIPASE, BLOOD: Lipase: 164 U/L — ABNORMAL HIGH (ref 11–51)

## 2023-02-08 MED ORDER — KETOROLAC TROMETHAMINE 15 MG/ML IJ SOLN
15.0000 mg | Freq: Once | INTRAMUSCULAR | Status: AC
  Administered 2023-02-08: 15 mg via INTRAVENOUS
  Filled 2023-02-08: qty 1

## 2023-02-08 MED ORDER — ONDANSETRON HCL 4 MG/2ML IJ SOLN
4.0000 mg | Freq: Once | INTRAMUSCULAR | Status: AC
  Administered 2023-02-08: 4 mg via INTRAVENOUS
  Filled 2023-02-08: qty 2

## 2023-02-08 MED ORDER — SODIUM CHLORIDE 0.9 % IV BOLUS
1000.0000 mL | Freq: Once | INTRAVENOUS | Status: AC
  Administered 2023-02-08: 1000 mL via INTRAVENOUS

## 2023-02-08 MED ORDER — IOHEXOL 350 MG/ML SOLN: 100.0000 mL | Freq: Once | INTRAVENOUS | Status: DC | PRN

## 2023-02-08 NOTE — ED Notes (Signed)
[place] being performed at bedside

## 2023-02-08 NOTE — ED Provider Notes (Addendum)
Care assumed of patient from outgoing provider.  See their note for initial history, exam and plan.  Clinical Course as of 02/08/23 0752  Fri Feb 08, 2023  0448 Labs show elevated lipase, otherwise normal chemistry panel and CBC.  Will obtain CT [PS]  0627 Labs show elevated lipase. Lfts normal. Pt still having pain, and feels that pain is different from typical pancreatitis symptoms. Will obtain US. If non-diagnostic, may need MRCP. NPO since 9pm yesterday. Took percocet at 9:30pm yesterday, no opioids since. Would be able to proceed with HIDA this morning.  [PS]  8756 With refractory pain, outpatient treatment failure, will need to admit for pancreatitis vs cholecystitis. [place] to clarify which service is most appropriate to consult for admit.  [PS]  0700 History of chronic pancreatitis - worsening/different - 1 week.  [place] pending.  [SM]    Clinical Course User Index [PS] Sharman Cheek, MD [SM] Corena Herter, MD    Ultrasound without findings consistent with acute cholecystitis -no findings consistent with gallstones.  Small gallbladder polyp less than 2 mm.  On reevaluation ongoing abdominal pain.  Consulted hospitalist for intractable pain and concern for acute on chronic pancreatitis.  HIDA scan ordered.  Patient NPO.  Notified by the hospital group at time of admission that she did not want to stay for symptomatic treatment and wanted to leave and go to [facility].  Patient left and eloped from the emergency department.  She did not want a wait for any discharge paperwork.  Patient pulled out her own IV.  Attempted to discuss further workup with HIDA scan for further information.  Patient states she was not interested in she was leaving regardless of what we said.   Corena Herter, MD 02/08/23 4332    Corena Herter, MD 02/08/23 9518    Corena Herter, MD 02/08/23 [phone]

## 2023-02-08 NOTE — ED Notes (Signed)
Pt departed abruptly following conversation with inpatient hospitalist. MD Mumma and RN Ladona Ridgel questioned pt on reason for leaving and pt stated she wished to go to a different facility. Pt discharged per MD.

## 2023-02-08 NOTE — ED Provider Notes (Signed)
[facility] Provider Note    Event Date/Time   First MD Initiated Contact with Patient 02/08/23 [phone]     (approximate)   History   Chief Complaint: Abdominal Pain   HPI  EVALYNE LACAP is a 52 y.o. female with a history of hypertension, fatty liver, GERD, bipolar disorder, pancreatitis who comes ED complaining of epigastric pain radiating through to her back.  Nausea but no vomiting, no significant diarrhea or constipation, no fever.  No chest pain or shortness of breath.     Physical Exam   Triage Vital Signs: ED Triage Vitals  Encounter Vitals Group     BP 02/08/23 0318 (!) 166/92     Systolic BP Percentile --      Diastolic BP Percentile --      Pulse Rate 02/08/23 0318 88     Resp 02/08/23 0318 18     Temp 02/08/23 0318 98.1 F (36.7 C)     Temp Source 02/08/23 0318 Oral     SpO2 02/08/23 0318 100 %     Weight 02/08/23 0316 255 lb (115.7 kg)     Height 02/08/23 0316 5\' 11"  (1.803 m)     Head Circumference --      Peak Flow --      Pain Score 02/08/23 0316 8     Pain Loc --      Pain Education --      Exclude from Growth Chart --     Most recent vital signs: Vitals:   02/08/23 0600 02/08/23 0630  BP: 135/85 (!) 145/86  Pulse:  63  Resp: 15 15  Temp:    SpO2: 100% 100%    General: Awake, no distress.  CV:  Good peripheral perfusion.  Regular rate rhythm Resp:  Normal effort.  Clear to auscultation bilaterally Abd:  No distention.  Soft with epigastric tenderness Other:  No lower extremity edema.  Somewhat dry oral mucosa.   ED Results / Procedures / Treatments   Labs (all labs ordered are listed, but only abnormal results are displayed) Labs Reviewed  LIPASE, BLOOD - Abnormal; Notable for the following components:      Result Value   Lipase 164 (*)    All other components within normal limits  COMPREHENSIVE METABOLIC PANEL - Abnormal; Notable for the following components:   Sodium 131 (*)    Chloride 97 (*)    Glucose,  Bld 117 (*)    Total Protein 8.2 (*)    All other components within normal limits  CBC  URINALYSIS, ROUTINE W REFLEX MICROSCOPIC  POC URINE PREG, ED     EKG Interpreted by me Sinus rhythm rate of 77.  Normal axis, first-degree AV block.  Normal QRS ST segments and T waves.   RADIOLOGY [place] abd pending   PROCEDURES:  Procedures   MEDICATIONS ORDERED IN ED: Medications  iohexol (OMNIPAQUE) 350 MG/ML injection 100 mL (has no administration in time range)  ondansetron (ZOFRAN) injection 4 mg (4 mg Intravenous Given 02/08/23 0350)  sodium chloride 0.9 % bolus 1,000 mL (0 mLs Intravenous Stopped 02/08/23 0445)  ketorolac (TORADOL) 15 MG/ML injection 15 mg (15 mg Intravenous Given 02/08/23 0351)     IMPRESSION / MDM / ASSESSMENT AND PLAN / ED COURSE  I reviewed the triage vital signs and the nursing notes.  DDx: GERD, pancreatitis, cholecystitis, UTI, AKI, electrolyte abnormality  Patient's presentation is most consistent with acute presentation with potential threat to life or bodily function.  Patient presents with upper abdominal pain worrisome for recurrent pancreatitis.  Will check labs, give IV Toradol and fluids.   Clinical Course as of 02/08/23 0647  Fri Feb 08, 2023  0448 Labs show elevated lipase, otherwise normal chemistry panel and CBC.  Will obtain CT [PS]  0627 Labs show elevated lipase. Lfts normal. Pt still having pain, and feels that pain is different from typical pancreatitis symptoms. Will obtain US. If non-diagnostic, may need MRCP. NPO since 9pm yesterday. Took percocet at 9:30pm yesterday, no opioids since. Would be able to proceed with HIDA this morning.  [PS]  5284 With refractory pain, outpatient treatment failure, will need to admit for pancreatitis vs cholecystitis. [place] to clarify which service is most appropriate to consult for admit.  [PS]    Clinical Course User Index [PS] Sharman Cheek, MD     FINAL CLINICAL IMPRESSION(S) / ED DIAGNOSES    Final diagnoses:  Pain of upper abdomen     Rx / DC Orders   ED Discharge Orders     None        Note:  This document was prepared using Dragon voice recognition software and may include unintentional dictation errors.   Sharman Cheek, MD 02/08/23 [phone]

## 2023-02-08 NOTE — ED Triage Notes (Signed)
Epigastric pain with radiation to L flank. Reports hx of same as well as pancreatitis. States seen recently for same but that this time pt feels that it is maybe her gallbladder. Pt alert and oriented. Breathing unlabored. Ambulatory. Reports associated nausea. Slight constipation with last bm 2 days ago. Pt has been taking pain medication.

## 2023-03-01 ENCOUNTER — Other Ambulatory Visit: Payer: Self-pay | Admitting: Family Medicine

## 2023-03-01 DIAGNOSIS — Z1231 Encounter for screening mammogram for malignant neoplasm of breast: Secondary | ICD-10-CM

## 2023-04-08 ENCOUNTER — Ambulatory Visit: Payer: Medicare (Managed Care)

## 2023-04-08 DIAGNOSIS — R1013 Epigastric pain: Secondary | ICD-10-CM | POA: Diagnosis not present

## 2023-05-01 ENCOUNTER — Ambulatory Visit
Admission: RE | Admit: 2023-05-01 | Discharge: 2023-05-01 | Disposition: A | Discharge status: Home / Self Care | Payer: Medicare (Managed Care) | Source: Ambulatory Visit | Attending: Family Medicine | Admitting: Family Medicine

## 2023-05-01 DIAGNOSIS — Z1231 Encounter for screening mammogram for malignant neoplasm of breast: Secondary | ICD-10-CM | POA: Diagnosis present

## 2023-12-29 ENCOUNTER — Other Ambulatory Visit: Payer: Self-pay

## 2023-12-29 ENCOUNTER — Emergency Department
Admission: EM | Admit: 2023-12-29 | Discharge: 2023-12-29 | Disposition: A | Discharge status: Home / Self Care | Payer: Medicare (Managed Care) | Attending: Emergency Medicine | Admitting: Emergency Medicine

## 2023-12-29 DIAGNOSIS — R1010 Upper abdominal pain, unspecified: Secondary | ICD-10-CM | POA: Diagnosis present

## 2023-12-29 DIAGNOSIS — K2900 Acute gastritis without bleeding: Secondary | ICD-10-CM

## 2023-12-29 DIAGNOSIS — I10 Essential (primary) hypertension: Secondary | ICD-10-CM | POA: Insufficient documentation

## 2023-12-29 DIAGNOSIS — R1013 Epigastric pain: Secondary | ICD-10-CM | POA: Insufficient documentation

## 2023-12-29 LAB — LIPASE, BLOOD: Lipase: 39 U/L (ref 11–51)

## 2023-12-29 LAB — CBC
HCT: 40.9 % (ref 36.0–46.0)
Hemoglobin: 13.5 g/dL (ref 12.0–15.0)
MCH: 30.1 pg (ref 26.0–34.0)
MCHC: 33 g/dL (ref 30.0–36.0)
MCV: 91.1 fL (ref 80.0–100.0)
Platelets: 237 K/uL (ref 150–400)
RBC: 4.49 MIL/uL (ref 3.87–5.11)
RDW: 12.8 % (ref 11.5–15.5)
WBC: 8.3 K/uL (ref 4.0–10.5)
nRBC: 0 % (ref 0.0–0.2)

## 2023-12-29 LAB — COMPREHENSIVE METABOLIC PANEL WITH GFR
ALT: 12 U/L (ref 0–44)
AST: 21 U/L (ref 15–41)
Albumin: 4.1 g/dL (ref 3.5–5.0)
Alkaline Phosphatase: 81 U/L (ref 38–126)
Anion gap: 9 (ref 5–15)
BUN: 10 mg/dL (ref 6–20)
CO2: 22 mmol/L (ref 22–32)
Calcium: 9.1 mg/dL (ref 8.9–10.3)
Chloride: 101 mmol/L (ref 98–111)
Creatinine, Ser: 0.8 mg/dL (ref 0.44–1.00)
GFR, Estimated: >60 mL/min (ref >60)
Glucose, Bld: 104 mg/dL — ABNORMAL HIGH (ref 70–99)
Potassium: 3.8 mmol/L (ref 3.5–5.1)
Sodium: 132 mmol/L — ABNORMAL LOW (ref 135–145)
Total Bilirubin: 0.7 mg/dL (ref 0.0–1.2)
Total Protein: 8 g/dL (ref 6.5–8.1)

## 2023-12-29 LAB — URINALYSIS, ROUTINE W REFLEX MICROSCOPIC
Bilirubin Urine: NEGATIVE
Glucose, UA: NEGATIVE mg/dL
Ketones, ur: NEGATIVE mg/dL
Leukocytes,Ua: NEGATIVE
Nitrite: NEGATIVE
Protein, ur: 30 mg/dL — AB
Specific Gravity, Urine: 1.014 (ref 1.005–1.030)
pH: 7 (ref 5.0–8.0)

## 2023-12-29 MED ORDER — FAMOTIDINE IN NACL 20-0.9 MG/50ML-% IV SOLN
20.0000 mg | Freq: Once | INTRAVENOUS | Status: AC
  Administered 2023-12-29: 20 mg via INTRAVENOUS
  Filled 2023-12-29: qty 50

## 2023-12-29 MED ORDER — ONDANSETRON HCL 4 MG/2ML IJ SOLN
4.0000 mg | Freq: Once | INTRAMUSCULAR | Status: AC
  Administered 2023-12-29: 4 mg via INTRAVENOUS
  Filled 2023-12-29: qty 2

## 2023-12-29 MED ORDER — ALUM & MAG HYDROXIDE-SIMETH 200-200-20 MG/5ML PO SUSP
30.0000 mL | Freq: Once | ORAL | Status: AC
  Administered 2023-12-29: 30 mL via ORAL
  Filled 2023-12-29: qty 30

## 2023-12-29 MED ORDER — LIDOCAINE VISCOUS HCL 2 % MT SOLN
15.0000 mL | Freq: Once | OROMUCOSAL | Status: AC
  Administered 2023-12-29: 15 mL via OROMUCOSAL
  Filled 2023-12-29: qty 15

## 2023-12-29 MED ORDER — SODIUM CHLORIDE 0.9 % IV BOLUS
1000.0000 mL | Freq: Once | INTRAVENOUS | Status: AC
  Administered 2023-12-29: 1000 mL via INTRAVENOUS

## 2023-12-29 NOTE — ED Provider Notes (Signed)
 [facility] Provider Note    Event Date/Time   First MD Initiated Contact with Patient 12/29/23 1008     (approximate)   History   Abdominal Pain   HPI  Kristina Schultz is a 52 y.o. female who presents to the ED for evaluation of Abdominal Pain   I reviewed various ED visits where patient was seen for idiopathic acute pancreatitis. Subsequent GI consult as an outpatient 11 months ago.  Endoscopy in November, gastritis with a normal esophagus and duodenum. Otherwise history of seizure disorder, HTN, HLD, GERD.  Patient presents for evaluation of 3 days of upper abdominal pain.  She reports a single episode of nausea but no emesis.  No urinary or stool changes, fevers   Physical Exam   Triage Vital Signs: ED Triage Vitals  Encounter Vitals Group     BP 12/29/23 1006 (!) 150/88     Girls Systolic BP Percentile --      Girls Diastolic BP Percentile --      Boys Systolic BP Percentile --      Boys Diastolic BP Percentile --      Pulse Rate 12/29/23 1006 86     Resp 12/29/23 1006 18     Temp 12/29/23 1006 98.7 F (37.1 C)     Temp src --      SpO2 12/29/23 1006 99 %     Weight 12/29/23 1004 250 lb (113.4 kg)     Height 12/29/23 1004 5' 11 (1.803 m)     Head Circumference --      Peak Flow --      Pain Score 12/29/23 1004 4     Pain Loc --      Pain Education --      Exclude from Growth Chart --     Most recent vital signs: Vitals:   12/29/23 1006  BP: (!) 150/88  Pulse: 86  Resp: 18  Temp: 98.7 F (37.1 C)  SpO2: 99%    General: Awake, no distress.  CV:  Good peripheral perfusion.  Resp:  Normal effort.  Abd:  No distention.  Minimal epigastric and LUQ tenderness but otherwise benign.  No guarding or peritoneal features. MSK:  No deformity noted.  Neuro:  No focal deficits appreciated. Other:     ED Results / Procedures / Treatments   Labs (all labs ordered are listed, but only abnormal results are displayed) Labs Reviewed   CBC  LIPASE, BLOOD  COMPREHENSIVE METABOLIC PANEL WITH GFR  URINALYSIS, ROUTINE W REFLEX MICROSCOPIC    EKG   RADIOLOGY   Official radiology report(s): No results found.  PROCEDURES and INTERVENTIONS:  Procedures  Medications - No data to display   IMPRESSION / MDM / ASSESSMENT AND PLAN / ED COURSE  I reviewed the triage vital signs and the nursing notes.  Differential diagnosis includes, but is not limited to, gastritis, GERD, acute pancreatitis, biliary colic, ACS  {Patient presents with symptoms of an acute illness or injury that is potentially life-threatening.  Patient presents for evaluation of epigastric pain, likely gastritis and suitable for outpatient management.  Mild localized tenderness, otherwise benign exam.  Normal blood work with CBC, lipase and metabolic panel.  Symptoms resolved with GI cocktail and H2 blocker.  We discussed using H2 blocker at home considering her recent endoscopy with signs of gastritis.  We discussed follow-up and ED return precautions, patient suitable for outpatient management.  Clinical Course as of 12/29/23 1318  Sun Dec 29, 2023  1125 Reassessed and discussed reassuring blood work results, minimal hyponatremia but otherwise essentially normal and at baseline.  We discussed plan of care and she is agreeable. [DS]  1126 3 days of epigastric pain [DS]  1314 Reassessed, feels well, no pain.  We discussed OTC acid suppression medication such as famotidine .  As needed medications on top of this, ED return precautions.  Answered questions. [DS]    Clinical Course User Index [DS] Claudene Rover, MD     FINAL CLINICAL IMPRESSION(S) / ED DIAGNOSES   Final diagnoses:  None     Rx / DC Orders   ED Discharge Orders     None        Note:  This document was prepared using Dragon voice recognition software and may include unintentional dictation errors.   Claudene Rover, MD 12/29/23 [phone]

## 2023-12-29 NOTE — Discharge Instructions (Addendum)
 Try over-the-counter medicine such as famotidine /Pepcid  to help reduce acid production in the stomach and prevent these episodes.  Can take once per day every day  It is also safe to take as needed medications like Tums, Maalox, Rolaids on top of this as needed  Return to the ED with any worsening symptoms

## 2023-12-29 NOTE — ED Triage Notes (Signed)
 Pt comes with c/o lower belly pain for three days. Pt states some nausea. Pt states hx of low sodium and pancreatitis.

## 2024-01-10 ENCOUNTER — Other Ambulatory Visit: Payer: Self-pay | Admitting: Family

## 2024-01-10 DIAGNOSIS — N921 Excessive and frequent menstruation with irregular cycle: Secondary | ICD-10-CM

## 2024-01-10 DIAGNOSIS — R140 Abdominal distension (gaseous): Secondary | ICD-10-CM

## 2024-01-10 DIAGNOSIS — R1010 Upper abdominal pain, unspecified: Secondary | ICD-10-CM

## 2024-01-17 ENCOUNTER — Ambulatory Visit: Payer: Medicare (Managed Care)

## 2024-01-17 ENCOUNTER — Ambulatory Visit
Admission: RE | Admit: 2024-01-17 | Discharge: 2024-01-17 | Disposition: A | Discharge status: Home / Self Care | Payer: Medicare (Managed Care) | Source: Ambulatory Visit | Attending: Family | Admitting: Family

## 2024-01-17 DIAGNOSIS — R140 Abdominal distension (gaseous): Secondary | ICD-10-CM | POA: Diagnosis present

## 2024-01-17 DIAGNOSIS — R1010 Upper abdominal pain, unspecified: Secondary | ICD-10-CM | POA: Insufficient documentation

## 2024-01-17 DIAGNOSIS — N921 Excessive and frequent menstruation with irregular cycle: Secondary | ICD-10-CM | POA: Insufficient documentation

## 2024-01-28 ENCOUNTER — Other Ambulatory Visit: Payer: Self-pay | Admitting: Family

## 2024-01-28 DIAGNOSIS — N921 Excessive and frequent menstruation with irregular cycle: Secondary | ICD-10-CM
